# Patient Record
Sex: Female | Born: 1945 | Race: White | Hispanic: No | Smoking: Never smoker
Health system: Southern US, Community
[De-identification: ages and names within clinical notes are randomized; demographics above are authoritative.]

## PROBLEM LIST (undated history)

## (undated) DIAGNOSIS — M4802 Spinal stenosis, cervical region: Secondary | ICD-10-CM

## (undated) DIAGNOSIS — N189 Chronic kidney disease, unspecified: Secondary | ICD-10-CM

## (undated) DIAGNOSIS — E7800 Pure hypercholesterolemia, unspecified: Secondary | ICD-10-CM

## (undated) DIAGNOSIS — I10 Essential (primary) hypertension: Secondary | ICD-10-CM

## (undated) DIAGNOSIS — E119 Type 2 diabetes mellitus without complications: Secondary | ICD-10-CM

## (undated) DIAGNOSIS — M160 Bilateral primary osteoarthritis of hip: Secondary | ICD-10-CM

## (undated) DIAGNOSIS — I700 Atherosclerosis of aorta: Secondary | ICD-10-CM

## (undated) DIAGNOSIS — R42 Dizziness and giddiness: Secondary | ICD-10-CM

## (undated) DIAGNOSIS — N3281 Overactive bladder: Secondary | ICD-10-CM

## (undated) DIAGNOSIS — H9319 Tinnitus, unspecified ear: Secondary | ICD-10-CM

## (undated) DIAGNOSIS — J189 Pneumonia, unspecified organism: Secondary | ICD-10-CM

## (undated) DIAGNOSIS — G4733 Obstructive sleep apnea (adult) (pediatric): Secondary | ICD-10-CM

## (undated) DIAGNOSIS — M8580 Other specified disorders of bone density and structure, unspecified site: Secondary | ICD-10-CM

## (undated) DIAGNOSIS — K635 Polyp of colon: Secondary | ICD-10-CM

## (undated) DIAGNOSIS — H269 Unspecified cataract: Secondary | ICD-10-CM

## (undated) DIAGNOSIS — N183 Chronic kidney disease, stage 3 unspecified: Secondary | ICD-10-CM

## (undated) DIAGNOSIS — M1990 Unspecified osteoarthritis, unspecified site: Secondary | ICD-10-CM

## (undated) DIAGNOSIS — C4490 Unspecified malignant neoplasm of skin, unspecified: Secondary | ICD-10-CM

## (undated) DIAGNOSIS — C801 Malignant (primary) neoplasm, unspecified: Secondary | ICD-10-CM

## (undated) HISTORY — PX: TUBAL LIGATION: SHX77

## (undated) HISTORY — PX: OTHER SURGICAL HISTORY: SHX169

---

## 2005-02-17 ENCOUNTER — Ambulatory Visit: Payer: Self-pay | Admitting: Internal Medicine

## 2006-04-06 ENCOUNTER — Ambulatory Visit: Payer: Self-pay | Admitting: Internal Medicine

## 2007-08-04 ENCOUNTER — Ambulatory Visit: Payer: Self-pay | Admitting: Internal Medicine

## 2008-08-07 ENCOUNTER — Ambulatory Visit: Payer: Self-pay | Admitting: Unknown Physician Specialty

## 2008-08-09 ENCOUNTER — Ambulatory Visit: Payer: Self-pay | Admitting: Internal Medicine

## 2009-10-02 ENCOUNTER — Ambulatory Visit: Payer: Self-pay | Admitting: Internal Medicine

## 2010-12-02 ENCOUNTER — Ambulatory Visit: Payer: Self-pay | Admitting: Internal Medicine

## 2010-12-12 ENCOUNTER — Ambulatory Visit: Payer: Self-pay | Admitting: Internal Medicine

## 2011-03-18 ENCOUNTER — Ambulatory Visit: Payer: Self-pay | Admitting: Internal Medicine

## 2011-04-13 ENCOUNTER — Ambulatory Visit: Payer: Self-pay | Admitting: Internal Medicine

## 2011-04-20 ENCOUNTER — Ambulatory Visit: Payer: Self-pay | Admitting: Internal Medicine

## 2012-07-11 ENCOUNTER — Ambulatory Visit: Payer: Self-pay | Admitting: Internal Medicine

## 2013-08-29 ENCOUNTER — Ambulatory Visit: Payer: Self-pay | Admitting: Unknown Physician Specialty

## 2013-08-29 DIAGNOSIS — D369 Benign neoplasm, unspecified site: Secondary | ICD-10-CM

## 2013-08-29 HISTORY — DX: Benign neoplasm, unspecified site: D36.9

## 2013-08-30 LAB — PATHOLOGY REPORT

## 2013-10-13 DIAGNOSIS — E1122 Type 2 diabetes mellitus with diabetic chronic kidney disease: Secondary | ICD-10-CM | POA: Insufficient documentation

## 2013-10-13 DIAGNOSIS — E1169 Type 2 diabetes mellitus with other specified complication: Secondary | ICD-10-CM | POA: Insufficient documentation

## 2013-10-13 DIAGNOSIS — I10 Essential (primary) hypertension: Secondary | ICD-10-CM | POA: Diagnosis present

## 2013-11-05 ENCOUNTER — Ambulatory Visit: Payer: Self-pay | Admitting: Internal Medicine

## 2013-11-10 ENCOUNTER — Ambulatory Visit: Payer: Self-pay | Admitting: Internal Medicine

## 2013-11-20 ENCOUNTER — Ambulatory Visit: Payer: Self-pay | Admitting: Internal Medicine

## 2014-04-19 DIAGNOSIS — J069 Acute upper respiratory infection, unspecified: Secondary | ICD-10-CM | POA: Diagnosis not present

## 2014-04-19 DIAGNOSIS — J029 Acute pharyngitis, unspecified: Secondary | ICD-10-CM | POA: Diagnosis not present

## 2014-06-18 DIAGNOSIS — N183 Chronic kidney disease, stage 3 (moderate): Secondary | ICD-10-CM | POA: Diagnosis not present

## 2014-06-18 DIAGNOSIS — E1122 Type 2 diabetes mellitus with diabetic chronic kidney disease: Secondary | ICD-10-CM | POA: Diagnosis not present

## 2014-06-18 DIAGNOSIS — E1169 Type 2 diabetes mellitus with other specified complication: Secondary | ICD-10-CM | POA: Diagnosis not present

## 2014-06-18 DIAGNOSIS — E785 Hyperlipidemia, unspecified: Secondary | ICD-10-CM | POA: Diagnosis not present

## 2014-06-19 DIAGNOSIS — D485 Neoplasm of uncertain behavior of skin: Secondary | ICD-10-CM | POA: Diagnosis not present

## 2014-06-19 DIAGNOSIS — C44619 Basal cell carcinoma of skin of left upper limb, including shoulder: Secondary | ICD-10-CM | POA: Diagnosis not present

## 2014-06-19 DIAGNOSIS — D2262 Melanocytic nevi of left upper limb, including shoulder: Secondary | ICD-10-CM | POA: Diagnosis not present

## 2014-06-19 DIAGNOSIS — D225 Melanocytic nevi of trunk: Secondary | ICD-10-CM | POA: Diagnosis not present

## 2014-06-19 DIAGNOSIS — X32XXXA Exposure to sunlight, initial encounter: Secondary | ICD-10-CM | POA: Diagnosis not present

## 2014-06-19 DIAGNOSIS — L570 Actinic keratosis: Secondary | ICD-10-CM | POA: Diagnosis not present

## 2014-06-19 DIAGNOSIS — L400 Psoriasis vulgaris: Secondary | ICD-10-CM | POA: Diagnosis not present

## 2014-06-19 DIAGNOSIS — L821 Other seborrheic keratosis: Secondary | ICD-10-CM | POA: Diagnosis not present

## 2014-06-19 DIAGNOSIS — C44612 Basal cell carcinoma of skin of right upper limb, including shoulder: Secondary | ICD-10-CM | POA: Diagnosis not present

## 2014-06-24 DIAGNOSIS — E1142 Type 2 diabetes mellitus with diabetic polyneuropathy: Secondary | ICD-10-CM | POA: Diagnosis not present

## 2014-06-24 DIAGNOSIS — L600 Ingrowing nail: Secondary | ICD-10-CM | POA: Diagnosis not present

## 2014-06-25 DIAGNOSIS — E1169 Type 2 diabetes mellitus with other specified complication: Secondary | ICD-10-CM | POA: Diagnosis not present

## 2014-06-25 DIAGNOSIS — I10 Essential (primary) hypertension: Secondary | ICD-10-CM | POA: Diagnosis not present

## 2014-06-25 DIAGNOSIS — N182 Chronic kidney disease, stage 2 (mild): Secondary | ICD-10-CM | POA: Diagnosis not present

## 2014-06-25 DIAGNOSIS — E1122 Type 2 diabetes mellitus with diabetic chronic kidney disease: Secondary | ICD-10-CM | POA: Diagnosis not present

## 2014-08-01 DIAGNOSIS — C44612 Basal cell carcinoma of skin of right upper limb, including shoulder: Secondary | ICD-10-CM | POA: Diagnosis not present

## 2014-08-01 DIAGNOSIS — C44619 Basal cell carcinoma of skin of left upper limb, including shoulder: Secondary | ICD-10-CM | POA: Diagnosis not present

## 2014-08-07 DIAGNOSIS — I10 Essential (primary) hypertension: Secondary | ICD-10-CM | POA: Diagnosis not present

## 2014-08-07 DIAGNOSIS — H2513 Age-related nuclear cataract, bilateral: Secondary | ICD-10-CM | POA: Diagnosis not present

## 2014-08-07 DIAGNOSIS — H52223 Regular astigmatism, bilateral: Secondary | ICD-10-CM | POA: Diagnosis not present

## 2014-08-07 DIAGNOSIS — H35033 Hypertensive retinopathy, bilateral: Secondary | ICD-10-CM | POA: Diagnosis not present

## 2014-08-07 DIAGNOSIS — H5203 Hypermetropia, bilateral: Secondary | ICD-10-CM | POA: Diagnosis not present

## 2014-08-07 DIAGNOSIS — E109 Type 1 diabetes mellitus without complications: Secondary | ICD-10-CM | POA: Diagnosis not present

## 2014-08-07 DIAGNOSIS — H524 Presbyopia: Secondary | ICD-10-CM | POA: Diagnosis not present

## 2014-10-22 DIAGNOSIS — E1169 Type 2 diabetes mellitus with other specified complication: Secondary | ICD-10-CM | POA: Diagnosis not present

## 2014-10-22 DIAGNOSIS — E1122 Type 2 diabetes mellitus with diabetic chronic kidney disease: Secondary | ICD-10-CM | POA: Diagnosis not present

## 2014-10-22 DIAGNOSIS — I10 Essential (primary) hypertension: Secondary | ICD-10-CM | POA: Diagnosis not present

## 2014-10-22 DIAGNOSIS — N182 Chronic kidney disease, stage 2 (mild): Secondary | ICD-10-CM | POA: Diagnosis not present

## 2014-10-22 DIAGNOSIS — E785 Hyperlipidemia, unspecified: Secondary | ICD-10-CM | POA: Diagnosis not present

## 2014-10-29 DIAGNOSIS — Z1231 Encounter for screening mammogram for malignant neoplasm of breast: Secondary | ICD-10-CM | POA: Diagnosis not present

## 2014-10-29 DIAGNOSIS — I10 Essential (primary) hypertension: Secondary | ICD-10-CM | POA: Diagnosis not present

## 2014-10-29 DIAGNOSIS — E1169 Type 2 diabetes mellitus with other specified complication: Secondary | ICD-10-CM | POA: Diagnosis not present

## 2014-10-29 DIAGNOSIS — E1122 Type 2 diabetes mellitus with diabetic chronic kidney disease: Secondary | ICD-10-CM | POA: Diagnosis not present

## 2014-10-30 ENCOUNTER — Other Ambulatory Visit: Payer: Self-pay | Admitting: Internal Medicine

## 2014-10-30 DIAGNOSIS — Z1231 Encounter for screening mammogram for malignant neoplasm of breast: Secondary | ICD-10-CM

## 2014-11-07 DIAGNOSIS — L820 Inflamed seborrheic keratosis: Secondary | ICD-10-CM | POA: Diagnosis not present

## 2014-11-07 DIAGNOSIS — L570 Actinic keratosis: Secondary | ICD-10-CM | POA: Diagnosis not present

## 2014-11-07 DIAGNOSIS — Z85828 Personal history of other malignant neoplasm of skin: Secondary | ICD-10-CM | POA: Diagnosis not present

## 2014-11-07 DIAGNOSIS — D225 Melanocytic nevi of trunk: Secondary | ICD-10-CM | POA: Diagnosis not present

## 2014-11-07 DIAGNOSIS — X32XXXA Exposure to sunlight, initial encounter: Secondary | ICD-10-CM | POA: Diagnosis not present

## 2014-11-07 DIAGNOSIS — D2261 Melanocytic nevi of right upper limb, including shoulder: Secondary | ICD-10-CM | POA: Diagnosis not present

## 2014-11-22 ENCOUNTER — Other Ambulatory Visit: Payer: Self-pay | Admitting: Internal Medicine

## 2014-11-22 ENCOUNTER — Ambulatory Visit
Admission: RE | Admit: 2014-11-22 | Discharge: 2014-11-22 | Disposition: A | Discharge status: Home / Self Care | Payer: Commercial Managed Care - HMO | Source: Ambulatory Visit | Attending: Internal Medicine | Admitting: Internal Medicine

## 2014-11-22 DIAGNOSIS — Z1231 Encounter for screening mammogram for malignant neoplasm of breast: Secondary | ICD-10-CM | POA: Diagnosis not present

## 2015-01-17 DIAGNOSIS — Z23 Encounter for immunization: Secondary | ICD-10-CM | POA: Diagnosis not present

## 2015-02-06 DIAGNOSIS — H35033 Hypertensive retinopathy, bilateral: Secondary | ICD-10-CM | POA: Diagnosis not present

## 2015-02-06 DIAGNOSIS — H5203 Hypermetropia, bilateral: Secondary | ICD-10-CM | POA: Diagnosis not present

## 2015-02-06 DIAGNOSIS — H2513 Age-related nuclear cataract, bilateral: Secondary | ICD-10-CM | POA: Diagnosis not present

## 2015-02-06 DIAGNOSIS — I10 Essential (primary) hypertension: Secondary | ICD-10-CM | POA: Diagnosis not present

## 2015-02-06 DIAGNOSIS — E109 Type 1 diabetes mellitus without complications: Secondary | ICD-10-CM | POA: Diagnosis not present

## 2015-02-06 DIAGNOSIS — H52223 Regular astigmatism, bilateral: Secondary | ICD-10-CM | POA: Diagnosis not present

## 2015-04-24 DIAGNOSIS — E785 Hyperlipidemia, unspecified: Secondary | ICD-10-CM | POA: Diagnosis not present

## 2015-04-24 DIAGNOSIS — N182 Chronic kidney disease, stage 2 (mild): Secondary | ICD-10-CM | POA: Diagnosis not present

## 2015-04-24 DIAGNOSIS — E1169 Type 2 diabetes mellitus with other specified complication: Secondary | ICD-10-CM | POA: Diagnosis not present

## 2015-04-24 DIAGNOSIS — E1122 Type 2 diabetes mellitus with diabetic chronic kidney disease: Secondary | ICD-10-CM | POA: Diagnosis not present

## 2015-05-01 DIAGNOSIS — E1122 Type 2 diabetes mellitus with diabetic chronic kidney disease: Secondary | ICD-10-CM | POA: Diagnosis not present

## 2015-05-01 DIAGNOSIS — N182 Chronic kidney disease, stage 2 (mild): Secondary | ICD-10-CM | POA: Diagnosis not present

## 2015-05-01 DIAGNOSIS — Z794 Long term (current) use of insulin: Secondary | ICD-10-CM | POA: Diagnosis not present

## 2015-05-01 DIAGNOSIS — E785 Hyperlipidemia, unspecified: Secondary | ICD-10-CM | POA: Diagnosis not present

## 2015-05-01 DIAGNOSIS — Z0000 Encounter for general adult medical examination without abnormal findings: Secondary | ICD-10-CM | POA: Diagnosis not present

## 2015-05-01 DIAGNOSIS — I10 Essential (primary) hypertension: Secondary | ICD-10-CM | POA: Diagnosis not present

## 2015-05-01 DIAGNOSIS — E1169 Type 2 diabetes mellitus with other specified complication: Secondary | ICD-10-CM | POA: Diagnosis not present

## 2015-07-31 DIAGNOSIS — H52223 Regular astigmatism, bilateral: Secondary | ICD-10-CM | POA: Diagnosis not present

## 2015-07-31 DIAGNOSIS — E119 Type 2 diabetes mellitus without complications: Secondary | ICD-10-CM | POA: Diagnosis not present

## 2015-07-31 DIAGNOSIS — H2513 Age-related nuclear cataract, bilateral: Secondary | ICD-10-CM | POA: Diagnosis not present

## 2015-07-31 DIAGNOSIS — H35033 Hypertensive retinopathy, bilateral: Secondary | ICD-10-CM | POA: Diagnosis not present

## 2015-07-31 DIAGNOSIS — I10 Essential (primary) hypertension: Secondary | ICD-10-CM | POA: Diagnosis not present

## 2015-07-31 DIAGNOSIS — H5203 Hypermetropia, bilateral: Secondary | ICD-10-CM | POA: Diagnosis not present

## 2015-08-07 DIAGNOSIS — D2272 Melanocytic nevi of left lower limb, including hip: Secondary | ICD-10-CM | POA: Diagnosis not present

## 2015-08-07 DIAGNOSIS — Z85828 Personal history of other malignant neoplasm of skin: Secondary | ICD-10-CM | POA: Diagnosis not present

## 2015-08-07 DIAGNOSIS — D2271 Melanocytic nevi of right lower limb, including hip: Secondary | ICD-10-CM | POA: Diagnosis not present

## 2015-08-07 DIAGNOSIS — D2261 Melanocytic nevi of right upper limb, including shoulder: Secondary | ICD-10-CM | POA: Diagnosis not present

## 2015-08-07 DIAGNOSIS — D485 Neoplasm of uncertain behavior of skin: Secondary | ICD-10-CM | POA: Diagnosis not present

## 2015-08-26 DIAGNOSIS — E1169 Type 2 diabetes mellitus with other specified complication: Secondary | ICD-10-CM | POA: Diagnosis not present

## 2015-08-26 DIAGNOSIS — Z794 Long term (current) use of insulin: Secondary | ICD-10-CM | POA: Diagnosis not present

## 2015-08-26 DIAGNOSIS — E785 Hyperlipidemia, unspecified: Secondary | ICD-10-CM | POA: Diagnosis not present

## 2015-08-26 DIAGNOSIS — E1122 Type 2 diabetes mellitus with diabetic chronic kidney disease: Secondary | ICD-10-CM | POA: Diagnosis not present

## 2015-08-26 DIAGNOSIS — N182 Chronic kidney disease, stage 2 (mild): Secondary | ICD-10-CM | POA: Diagnosis not present

## 2015-09-09 DIAGNOSIS — I10 Essential (primary) hypertension: Secondary | ICD-10-CM | POA: Diagnosis not present

## 2015-09-09 DIAGNOSIS — Z794 Long term (current) use of insulin: Secondary | ICD-10-CM | POA: Diagnosis not present

## 2015-09-09 DIAGNOSIS — E785 Hyperlipidemia, unspecified: Secondary | ICD-10-CM | POA: Diagnosis not present

## 2015-09-09 DIAGNOSIS — N182 Chronic kidney disease, stage 2 (mild): Secondary | ICD-10-CM | POA: Diagnosis not present

## 2015-09-09 DIAGNOSIS — E1169 Type 2 diabetes mellitus with other specified complication: Secondary | ICD-10-CM | POA: Diagnosis not present

## 2015-09-09 DIAGNOSIS — E1122 Type 2 diabetes mellitus with diabetic chronic kidney disease: Secondary | ICD-10-CM | POA: Diagnosis not present

## 2015-10-01 DIAGNOSIS — C44612 Basal cell carcinoma of skin of right upper limb, including shoulder: Secondary | ICD-10-CM | POA: Diagnosis not present

## 2015-11-12 DIAGNOSIS — L720 Epidermal cyst: Secondary | ICD-10-CM | POA: Diagnosis not present

## 2015-11-26 ENCOUNTER — Other Ambulatory Visit: Payer: Self-pay | Admitting: Internal Medicine

## 2015-11-26 DIAGNOSIS — Z1231 Encounter for screening mammogram for malignant neoplasm of breast: Secondary | ICD-10-CM

## 2015-12-17 ENCOUNTER — Ambulatory Visit: Payer: Commercial Managed Care - HMO

## 2015-12-22 ENCOUNTER — Other Ambulatory Visit: Payer: Self-pay | Admitting: Internal Medicine

## 2015-12-22 ENCOUNTER — Ambulatory Visit
Admission: RE | Admit: 2015-12-22 | Discharge: 2015-12-22 | Disposition: A | Discharge status: Home / Self Care | Payer: Commercial Managed Care - HMO | Source: Ambulatory Visit | Attending: Internal Medicine | Admitting: Internal Medicine

## 2015-12-22 DIAGNOSIS — Z1231 Encounter for screening mammogram for malignant neoplasm of breast: Secondary | ICD-10-CM | POA: Diagnosis not present

## 2016-01-02 DIAGNOSIS — Z23 Encounter for immunization: Secondary | ICD-10-CM | POA: Diagnosis not present

## 2016-01-20 DIAGNOSIS — E1122 Type 2 diabetes mellitus with diabetic chronic kidney disease: Secondary | ICD-10-CM | POA: Diagnosis not present

## 2016-01-20 DIAGNOSIS — E1169 Type 2 diabetes mellitus with other specified complication: Secondary | ICD-10-CM | POA: Diagnosis not present

## 2016-01-20 DIAGNOSIS — Z794 Long term (current) use of insulin: Secondary | ICD-10-CM | POA: Diagnosis not present

## 2016-01-20 DIAGNOSIS — N182 Chronic kidney disease, stage 2 (mild): Secondary | ICD-10-CM | POA: Diagnosis not present

## 2016-01-20 DIAGNOSIS — E785 Hyperlipidemia, unspecified: Secondary | ICD-10-CM | POA: Diagnosis not present

## 2016-01-21 DIAGNOSIS — D225 Melanocytic nevi of trunk: Secondary | ICD-10-CM | POA: Diagnosis not present

## 2016-01-21 DIAGNOSIS — D2239 Melanocytic nevi of other parts of face: Secondary | ICD-10-CM | POA: Diagnosis not present

## 2016-01-21 DIAGNOSIS — D2261 Melanocytic nevi of right upper limb, including shoulder: Secondary | ICD-10-CM | POA: Diagnosis not present

## 2016-01-21 DIAGNOSIS — Z85828 Personal history of other malignant neoplasm of skin: Secondary | ICD-10-CM | POA: Diagnosis not present

## 2016-01-21 DIAGNOSIS — D485 Neoplasm of uncertain behavior of skin: Secondary | ICD-10-CM | POA: Diagnosis not present

## 2016-01-21 DIAGNOSIS — L400 Psoriasis vulgaris: Secondary | ICD-10-CM | POA: Diagnosis not present

## 2016-01-27 DIAGNOSIS — E1169 Type 2 diabetes mellitus with other specified complication: Secondary | ICD-10-CM | POA: Diagnosis not present

## 2016-01-27 DIAGNOSIS — I10 Essential (primary) hypertension: Secondary | ICD-10-CM | POA: Diagnosis not present

## 2016-01-27 DIAGNOSIS — N182 Chronic kidney disease, stage 2 (mild): Secondary | ICD-10-CM | POA: Diagnosis not present

## 2016-01-27 DIAGNOSIS — E785 Hyperlipidemia, unspecified: Secondary | ICD-10-CM | POA: Diagnosis not present

## 2016-01-27 DIAGNOSIS — Z794 Long term (current) use of insulin: Secondary | ICD-10-CM | POA: Diagnosis not present

## 2016-01-27 DIAGNOSIS — E1122 Type 2 diabetes mellitus with diabetic chronic kidney disease: Secondary | ICD-10-CM | POA: Diagnosis not present

## 2016-05-26 DIAGNOSIS — E1169 Type 2 diabetes mellitus with other specified complication: Secondary | ICD-10-CM | POA: Diagnosis not present

## 2016-05-26 DIAGNOSIS — Z794 Long term (current) use of insulin: Secondary | ICD-10-CM | POA: Diagnosis not present

## 2016-05-26 DIAGNOSIS — E1122 Type 2 diabetes mellitus with diabetic chronic kidney disease: Secondary | ICD-10-CM | POA: Diagnosis not present

## 2016-05-26 DIAGNOSIS — E785 Hyperlipidemia, unspecified: Secondary | ICD-10-CM | POA: Diagnosis not present

## 2016-05-26 DIAGNOSIS — N182 Chronic kidney disease, stage 2 (mild): Secondary | ICD-10-CM | POA: Diagnosis not present

## 2016-06-02 DIAGNOSIS — N182 Chronic kidney disease, stage 2 (mild): Secondary | ICD-10-CM | POA: Diagnosis not present

## 2016-06-02 DIAGNOSIS — I10 Essential (primary) hypertension: Secondary | ICD-10-CM | POA: Diagnosis not present

## 2016-06-02 DIAGNOSIS — E1122 Type 2 diabetes mellitus with diabetic chronic kidney disease: Secondary | ICD-10-CM | POA: Diagnosis not present

## 2016-06-02 DIAGNOSIS — E785 Hyperlipidemia, unspecified: Secondary | ICD-10-CM | POA: Diagnosis not present

## 2016-06-02 DIAGNOSIS — E1169 Type 2 diabetes mellitus with other specified complication: Secondary | ICD-10-CM | POA: Diagnosis not present

## 2016-06-02 DIAGNOSIS — Z794 Long term (current) use of insulin: Secondary | ICD-10-CM | POA: Diagnosis not present

## 2016-07-29 DIAGNOSIS — H5203 Hypermetropia, bilateral: Secondary | ICD-10-CM | POA: Diagnosis not present

## 2016-07-29 DIAGNOSIS — H524 Presbyopia: Secondary | ICD-10-CM | POA: Diagnosis not present

## 2016-07-29 DIAGNOSIS — E119 Type 2 diabetes mellitus without complications: Secondary | ICD-10-CM | POA: Diagnosis not present

## 2016-07-29 DIAGNOSIS — H35033 Hypertensive retinopathy, bilateral: Secondary | ICD-10-CM | POA: Diagnosis not present

## 2016-07-29 DIAGNOSIS — H52223 Regular astigmatism, bilateral: Secondary | ICD-10-CM | POA: Diagnosis not present

## 2016-07-29 DIAGNOSIS — I10 Essential (primary) hypertension: Secondary | ICD-10-CM | POA: Diagnosis not present

## 2016-07-29 DIAGNOSIS — H2513 Age-related nuclear cataract, bilateral: Secondary | ICD-10-CM | POA: Diagnosis not present

## 2016-07-30 DIAGNOSIS — L02211 Cutaneous abscess of abdominal wall: Secondary | ICD-10-CM | POA: Diagnosis not present

## 2016-08-02 DIAGNOSIS — L02211 Cutaneous abscess of abdominal wall: Secondary | ICD-10-CM | POA: Diagnosis not present

## 2016-08-18 DIAGNOSIS — L02211 Cutaneous abscess of abdominal wall: Secondary | ICD-10-CM | POA: Diagnosis not present

## 2016-09-01 DIAGNOSIS — L02211 Cutaneous abscess of abdominal wall: Secondary | ICD-10-CM | POA: Diagnosis not present

## 2016-09-28 DIAGNOSIS — Z794 Long term (current) use of insulin: Secondary | ICD-10-CM | POA: Diagnosis not present

## 2016-09-28 DIAGNOSIS — E1122 Type 2 diabetes mellitus with diabetic chronic kidney disease: Secondary | ICD-10-CM | POA: Diagnosis not present

## 2016-09-28 DIAGNOSIS — E785 Hyperlipidemia, unspecified: Secondary | ICD-10-CM | POA: Diagnosis not present

## 2016-09-28 DIAGNOSIS — E1169 Type 2 diabetes mellitus with other specified complication: Secondary | ICD-10-CM | POA: Diagnosis not present

## 2016-09-28 DIAGNOSIS — N182 Chronic kidney disease, stage 2 (mild): Secondary | ICD-10-CM | POA: Diagnosis not present

## 2016-09-28 DIAGNOSIS — I10 Essential (primary) hypertension: Secondary | ICD-10-CM | POA: Diagnosis not present

## 2016-10-05 DIAGNOSIS — I10 Essential (primary) hypertension: Secondary | ICD-10-CM | POA: Diagnosis not present

## 2016-10-05 DIAGNOSIS — E1122 Type 2 diabetes mellitus with diabetic chronic kidney disease: Secondary | ICD-10-CM | POA: Diagnosis not present

## 2016-10-05 DIAGNOSIS — E785 Hyperlipidemia, unspecified: Secondary | ICD-10-CM | POA: Diagnosis not present

## 2016-10-05 DIAGNOSIS — N182 Chronic kidney disease, stage 2 (mild): Secondary | ICD-10-CM | POA: Diagnosis not present

## 2016-10-05 DIAGNOSIS — E1169 Type 2 diabetes mellitus with other specified complication: Secondary | ICD-10-CM | POA: Diagnosis not present

## 2016-10-05 DIAGNOSIS — Z794 Long term (current) use of insulin: Secondary | ICD-10-CM | POA: Diagnosis not present

## 2016-10-06 DIAGNOSIS — L02211 Cutaneous abscess of abdominal wall: Secondary | ICD-10-CM | POA: Diagnosis not present

## 2016-10-11 ENCOUNTER — Other Ambulatory Visit: Payer: Self-pay | Admitting: Internal Medicine

## 2016-10-11 DIAGNOSIS — Z1231 Encounter for screening mammogram for malignant neoplasm of breast: Secondary | ICD-10-CM

## 2016-12-08 DIAGNOSIS — Z8371 Family history of colonic polyps: Secondary | ICD-10-CM | POA: Diagnosis not present

## 2016-12-08 DIAGNOSIS — Z86010 Personal history of colonic polyps: Secondary | ICD-10-CM | POA: Diagnosis not present

## 2016-12-23 DIAGNOSIS — L814 Other melanin hyperpigmentation: Secondary | ICD-10-CM | POA: Diagnosis not present

## 2016-12-23 DIAGNOSIS — L570 Actinic keratosis: Secondary | ICD-10-CM | POA: Diagnosis not present

## 2016-12-23 DIAGNOSIS — D235 Other benign neoplasm of skin of trunk: Secondary | ICD-10-CM | POA: Diagnosis not present

## 2016-12-23 DIAGNOSIS — X32XXXA Exposure to sunlight, initial encounter: Secondary | ICD-10-CM | POA: Diagnosis not present

## 2016-12-23 DIAGNOSIS — Z85828 Personal history of other malignant neoplasm of skin: Secondary | ICD-10-CM | POA: Diagnosis not present

## 2016-12-23 DIAGNOSIS — B351 Tinea unguium: Secondary | ICD-10-CM | POA: Diagnosis not present

## 2016-12-31 DIAGNOSIS — Z23 Encounter for immunization: Secondary | ICD-10-CM | POA: Diagnosis not present

## 2017-01-05 ENCOUNTER — Ambulatory Visit
Admission: RE | Admit: 2017-01-05 | Discharge: 2017-01-05 | Disposition: A | Discharge status: Home / Self Care | Payer: Commercial Managed Care - HMO | Source: Ambulatory Visit | Attending: Internal Medicine | Admitting: Internal Medicine

## 2017-01-05 DIAGNOSIS — Z1231 Encounter for screening mammogram for malignant neoplasm of breast: Secondary | ICD-10-CM | POA: Insufficient documentation

## 2017-02-02 DIAGNOSIS — Z794 Long term (current) use of insulin: Secondary | ICD-10-CM | POA: Diagnosis not present

## 2017-02-02 DIAGNOSIS — E785 Hyperlipidemia, unspecified: Secondary | ICD-10-CM | POA: Diagnosis not present

## 2017-02-02 DIAGNOSIS — E1169 Type 2 diabetes mellitus with other specified complication: Secondary | ICD-10-CM | POA: Diagnosis not present

## 2017-02-02 DIAGNOSIS — I10 Essential (primary) hypertension: Secondary | ICD-10-CM | POA: Diagnosis not present

## 2017-02-02 DIAGNOSIS — E1122 Type 2 diabetes mellitus with diabetic chronic kidney disease: Secondary | ICD-10-CM | POA: Diagnosis not present

## 2017-02-02 DIAGNOSIS — N182 Chronic kidney disease, stage 2 (mild): Secondary | ICD-10-CM | POA: Diagnosis not present

## 2017-02-09 DIAGNOSIS — Z0000 Encounter for general adult medical examination without abnormal findings: Secondary | ICD-10-CM | POA: Diagnosis not present

## 2017-02-09 DIAGNOSIS — I10 Essential (primary) hypertension: Secondary | ICD-10-CM | POA: Diagnosis not present

## 2017-02-09 DIAGNOSIS — E1169 Type 2 diabetes mellitus with other specified complication: Secondary | ICD-10-CM | POA: Diagnosis not present

## 2017-02-09 DIAGNOSIS — E785 Hyperlipidemia, unspecified: Secondary | ICD-10-CM | POA: Diagnosis not present

## 2017-02-09 DIAGNOSIS — Z794 Long term (current) use of insulin: Secondary | ICD-10-CM | POA: Diagnosis not present

## 2017-02-09 DIAGNOSIS — N182 Chronic kidney disease, stage 2 (mild): Secondary | ICD-10-CM | POA: Diagnosis not present

## 2017-02-09 DIAGNOSIS — E1122 Type 2 diabetes mellitus with diabetic chronic kidney disease: Secondary | ICD-10-CM | POA: Diagnosis not present

## 2017-02-22 ENCOUNTER — Encounter: Payer: Self-pay | Admitting: *Deleted

## 2017-02-23 ENCOUNTER — Encounter: Payer: Self-pay | Admitting: Anesthesiology

## 2017-02-23 ENCOUNTER — Ambulatory Visit
Admission: RE | Admit: 2017-02-23 | Discharge: 2017-02-23 | Disposition: A | Discharge status: Home / Self Care | Payer: Commercial Managed Care - HMO | Source: Ambulatory Visit | Attending: Unknown Physician Specialty | Admitting: Unknown Physician Specialty

## 2017-02-23 ENCOUNTER — Ambulatory Visit: Payer: Commercial Managed Care - HMO | Admitting: Anesthesiology

## 2017-02-23 ENCOUNTER — Encounter
Admission: RE | Disposition: A | Discharge status: Home / Self Care | Payer: Self-pay | Source: Ambulatory Visit | Attending: Unknown Physician Specialty

## 2017-02-23 DIAGNOSIS — I129 Hypertensive chronic kidney disease with stage 1 through stage 4 chronic kidney disease, or unspecified chronic kidney disease: Secondary | ICD-10-CM | POA: Diagnosis not present

## 2017-02-23 DIAGNOSIS — Q438 Other specified congenital malformations of intestine: Secondary | ICD-10-CM | POA: Insufficient documentation

## 2017-02-23 DIAGNOSIS — Z7982 Long term (current) use of aspirin: Secondary | ICD-10-CM | POA: Insufficient documentation

## 2017-02-23 DIAGNOSIS — E1122 Type 2 diabetes mellitus with diabetic chronic kidney disease: Secondary | ICD-10-CM | POA: Diagnosis not present

## 2017-02-23 DIAGNOSIS — Z86010 Personal history of colonic polyps: Secondary | ICD-10-CM | POA: Diagnosis not present

## 2017-02-23 DIAGNOSIS — D120 Benign neoplasm of cecum: Secondary | ICD-10-CM | POA: Insufficient documentation

## 2017-02-23 DIAGNOSIS — K635 Polyp of colon: Secondary | ICD-10-CM | POA: Insufficient documentation

## 2017-02-23 DIAGNOSIS — M8580 Other specified disorders of bone density and structure, unspecified site: Secondary | ICD-10-CM | POA: Insufficient documentation

## 2017-02-23 DIAGNOSIS — E7800 Pure hypercholesterolemia, unspecified: Secondary | ICD-10-CM | POA: Insufficient documentation

## 2017-02-23 DIAGNOSIS — Z79899 Other long term (current) drug therapy: Secondary | ICD-10-CM | POA: Insufficient documentation

## 2017-02-23 DIAGNOSIS — R229 Localized swelling, mass and lump, unspecified: Secondary | ICD-10-CM | POA: Diagnosis not present

## 2017-02-23 DIAGNOSIS — K640 First degree hemorrhoids: Secondary | ICD-10-CM | POA: Insufficient documentation

## 2017-02-23 DIAGNOSIS — Z794 Long term (current) use of insulin: Secondary | ICD-10-CM | POA: Insufficient documentation

## 2017-02-23 DIAGNOSIS — Z1211 Encounter for screening for malignant neoplasm of colon: Secondary | ICD-10-CM | POA: Insufficient documentation

## 2017-02-23 DIAGNOSIS — K6389 Other specified diseases of intestine: Secondary | ICD-10-CM | POA: Diagnosis not present

## 2017-02-23 DIAGNOSIS — R69 Illness, unspecified: Secondary | ICD-10-CM | POA: Diagnosis not present

## 2017-02-23 DIAGNOSIS — E119 Type 2 diabetes mellitus without complications: Secondary | ICD-10-CM | POA: Diagnosis not present

## 2017-02-23 DIAGNOSIS — N182 Chronic kidney disease, stage 2 (mild): Secondary | ICD-10-CM | POA: Diagnosis not present

## 2017-02-23 DIAGNOSIS — K648 Other hemorrhoids: Secondary | ICD-10-CM | POA: Diagnosis not present

## 2017-02-23 DIAGNOSIS — I10 Essential (primary) hypertension: Secondary | ICD-10-CM | POA: Diagnosis not present

## 2017-02-23 HISTORY — DX: Type 2 diabetes mellitus without complications: E11.9

## 2017-02-23 HISTORY — DX: Essential (primary) hypertension: I10

## 2017-02-23 HISTORY — PX: COLONOSCOPY WITH PROPOFOL: SHX5780

## 2017-02-23 HISTORY — DX: Pure hypercholesterolemia, unspecified: E78.00

## 2017-02-23 HISTORY — DX: Chronic kidney disease, unspecified: N18.9

## 2017-02-23 HISTORY — DX: Other specified disorders of bone density and structure, unspecified site: M85.80

## 2017-02-23 LAB — GLUCOSE, CAPILLARY: GLUCOSE-CAPILLARY: 192 mg/dL — AB (ref 65–99)

## 2017-02-23 SURGERY — COLONOSCOPY WITH PROPOFOL
Anesthesia: General

## 2017-02-23 MED ORDER — EPHEDRINE SULFATE 50 MG/ML IJ SOLN
INTRAMUSCULAR | Status: DC | PRN
  Administered 2017-02-23: 10 mg via INTRAVENOUS

## 2017-02-23 MED ORDER — SODIUM CHLORIDE 0.9 % IV SOLN
INTRAVENOUS | Status: DC
  Administered 2017-02-23 (×2): via INTRAVENOUS

## 2017-02-23 MED ORDER — LIDOCAINE 2% (20 MG/ML) 5 ML SYRINGE
INTRAMUSCULAR | Status: DC | PRN
  Administered 2017-02-23: 40 mg via INTRAVENOUS

## 2017-02-23 MED ORDER — FENTANYL CITRATE (PF) 100 MCG/2ML IJ SOLN
INTRAMUSCULAR | Status: DC | PRN
  Administered 2017-02-23 (×2): 50 ug via INTRAVENOUS

## 2017-02-23 MED ORDER — PHENYLEPHRINE HCL 10 MG/ML IJ SOLN
INTRAMUSCULAR | Status: DC | PRN
  Administered 2017-02-23: 100 ug via INTRAVENOUS

## 2017-02-23 MED ORDER — PROPOFOL 10 MG/ML IV BOLUS
INTRAVENOUS | Status: DC | PRN
  Administered 2017-02-23: 100 mg via INTRAVENOUS

## 2017-02-23 MED ORDER — PROPOFOL 500 MG/50ML IV EMUL
INTRAVENOUS | Status: AC
  Filled 2017-02-23: qty 50

## 2017-02-23 MED ORDER — PROPOFOL 10 MG/ML IV BOLUS
INTRAVENOUS | Status: AC
  Filled 2017-02-23: qty 20

## 2017-02-23 MED ORDER — FENTANYL CITRATE (PF) 100 MCG/2ML IJ SOLN
INTRAMUSCULAR | Status: AC
  Filled 2017-02-23: qty 2

## 2017-02-23 MED ORDER — PROPOFOL 500 MG/50ML IV EMUL
INTRAVENOUS | Status: DC | PRN
  Administered 2017-02-23: 160 ug/kg/min via INTRAVENOUS

## 2017-02-23 MED ORDER — SODIUM CHLORIDE 0.9 % IV SOLN: INTRAVENOUS | Status: DC

## 2017-02-23 NOTE — Anesthesia Preprocedure Evaluation (Addendum)
Anesthesia Evaluation  Patient identified by MRN, date of birth, ID band Patient awake    Reviewed: Allergy & Precautions, NPO status , Patient's Chart, lab work & pertinent test results, reviewed documented beta blocker date and time   Airway Mallampati: III  TM Distance: >3 FB     Dental  (+) Chipped, Upper Dentures   Pulmonary           Cardiovascular hypertension, Pt. on medications      Neuro/Psych    GI/Hepatic   Endo/Other  diabetes, Type 2  Renal/GU Renal disease     Musculoskeletal   Abdominal   Peds  Hematology   Anesthesia Other Findings   Reproductive/Obstetrics                            Anesthesia Physical Anesthesia Plan  ASA: III  Anesthesia Plan: General   Post-op Pain Management:    Induction: Intravenous  PONV Risk Score and Plan:   Airway Management Planned:   Additional Equipment:   Intra-op Plan:   Post-operative Plan:   Informed Consent: I have reviewed the patients History and Physical, chart, labs and discussed the procedure including the risks, benefits and alternatives for the proposed anesthesia with the patient or authorized representative who has indicated his/her understanding and acceptance.     Plan Discussed with: CRNA  Anesthesia Plan Comments:         Anesthesia Quick Evaluation

## 2017-02-23 NOTE — Anesthesia Postprocedure Evaluation (Signed)
Anesthesia Post Note  Patient: Morgan Nicholson  Procedure(s) Performed: COLONOSCOPY WITH PROPOFOL (N/A )  Patient location during evaluation: Endoscopy Anesthesia Type: General Level of consciousness: awake and alert Pain management: pain level controlled Vital Signs Assessment: post-procedure vital signs reviewed and stable Respiratory status: spontaneous breathing, nonlabored ventilation, respiratory function stable and patient connected to nasal cannula oxygen Cardiovascular status: blood pressure returned to baseline and stable Postop Assessment: no apparent nausea or vomiting Anesthetic complications: no     Last Vitals:  Vitals:   02/23/17 0945 02/23/17 0955  BP: (!) 95/57 118/70  Pulse: (!) 59 66  Resp: 13 13  Temp: (!) 36.1 C   SpO2: 93% 95%    Last Pain:  Vitals:   02/23/17 0945  TempSrc: Tympanic  PainSc: [name]

## 2017-02-23 NOTE — Op Note (Signed)
[facility] Gastroenterology Patient Name: Nataley Bahri Procedure Date: 02/23/2017 8:59 AM MRN: [id number] Account #: [id number] Date of Birth: [birth year] Admit Type: Outpatient Age: 71 Room: [facility] ENDO ROOM 3 Gender: Female Note Status: Finalized Procedure:            Colonoscopy Indications:          High risk colon cancer surveillance: Personal history                        of colonic polyps Providers:            Manya Silvas, MD Referring MD:         Ocie Cornfield. Ouida Sills MD, MD (Referring MD) Medicines:            Propofol per Anesthesia Complications:        No immediate complications. Procedure:            Pre-Anesthesia Assessment:                       - After reviewing the risks and benefits, the patient                        was deemed in satisfactory condition to undergo the                        procedure.                       After obtaining informed consent, the colonoscope was                        passed under direct vision. Throughout the procedure,                        the patient's blood pressure, pulse, and oxygen                        saturations were monitored continuously. The                        Colonoscope was introduced through the anus and                        advanced to the the cecum, identified by appendiceal                        orifice and ileocecal valve. The colonoscopy was                        somewhat difficult due to significant looping. The                        patient tolerated the procedure well. The quality of                        the bowel preparation was excellent. Findings:      A diminutive polyp was found in the cecum. The polyp was sessile. The       polyp was removed with a jumbo cold forceps. Resection and retrieval       were complete.      A  submucosal non-obstructing medium-sized mass was found in the mid       ascending colon. It was soft. The mass was non-circumferential. No   bleeding was present. Biopsies were taken with a cold forceps for       histology. This was not reported on previous procedures so it was likely       to evolve since then.      A diminutive polyp was found in the transverse colon. The polyp was       sessile. The polyp was removed with a hot snare. Resection was complete,       but the polyp tissue was not retrieved.      Internal hemorrhoids were found during endoscopy. The hemorrhoids were       small and Grade I (internal hemorrhoids that do not prolapse). Impression:           - One diminutive polyp in the cecum, removed with a                        jumbo cold forceps. Resected and retrieved.                       - Likely benign tumor in the mid ascending colon.                        Biopsied.                       - One diminutive polyp in the transverse colon, removed                        with a hot snare. Complete resection. Polyp tissue not                        retrieved.                       - Internal hemorrhoids. Recommendation:       - Await pathology results. Manya Silvas, MD 02/23/2017 9:44:09 AM This report has been signed electronically. Number of Addenda: 0 Note Initiated On: 02/23/2017 8:59 AM Scope Withdrawal Time: 0 hours 22 minutes 9 seconds  Total Procedure Duration: 0 hours 32 minutes 21 seconds       [facility] ([facility])

## 2017-02-23 NOTE — Transfer of Care (Signed)
Immediate Anesthesia Transfer of Care Note  Patient: Morgan Nicholson  Procedure(s) Performed: COLONOSCOPY WITH PROPOFOL (N/A )  Patient Location: PACU and Endoscopy Unit  Anesthesia Type:General  Level of Consciousness: drowsy  Airway & Oxygen Therapy: Patient Spontanous Breathing and Patient connected to nasal cannula oxygen  Post-op Assessment: Report given to RN and Post -op Vital signs reviewed and stable  Post vital signs: Reviewed and stable  Last Vitals:  Vitals:   02/23/17 0754  BP: 134/72  Pulse: 64  Resp: 14  Temp: (!) 36.2 C    Last Pain:  Vitals:   02/23/17 0754  TempSrc: Tympanic         Complications: No apparent anesthesia complications

## 2017-02-23 NOTE — Anesthesia Post-op Follow-up Note (Signed)
Anesthesia QCDR form completed.        

## 2017-02-23 NOTE — H&P (Signed)
Primary Care Physician:  Kirk Ruths, MD Primary Gastroenterologist:  Dr. Vira Agar  Pre-Procedure History & Physical: HPI:  Morgan Nicholson is a 71 y.o. female is here for an colonoscopy.   Past Medical History:  Diagnosis Date  . Chronic kidney disease    stage 2  . Diabetes mellitus without complication ([place])   . High cholesterol   . Hypertension   . Osteopenia     Past Surgical History:  Procedure Laterality Date  . colonic polyps    . TUBAL LIGATION      Prior to Admission medications   Medication Sig Start Date End Date Taking? Authorizing Provider  acyclovir (ZOVIRAX) 800 MG tablet Take 800 mg 3 (three) times daily by mouth.   Yes [provider]  amLODipine (NORVASC) 5 MG tablet Take 5 mg daily by mouth.   Yes [provider]  aspirin EC 81 MG tablet Take 81 mg daily by mouth.   Yes [provider]  atorvastatin (LIPITOR) 80 MG tablet Take 80 mg daily by mouth.   Yes [provider]  calcium-vitamin D (OSCAL WITH D) 500-200 MG-UNIT tablet Take 1 tablet 2 (two) times daily by mouth.   Yes [provider]  carvedilol (COREG) 25 MG tablet Take 25 mg daily by mouth.   Yes [provider]  glimepiride (AMARYL) 2 MG tablet Take 2 mg daily with breakfast by mouth.   Yes [provider]  insulin NPH-regular Human (NOVOLIN 70/30) (70-30) 100 UNIT/ML injection Inject 20 Units 2 (two) times daily with a meal into the skin.   Yes [provider]  lisinopril (PRINIVIL,ZESTRIL) 40 MG tablet Take 40 mg daily by mouth.   Yes [provider]  metFORMIN (GLUCOPHAGE) 1000 MG tablet Take 1,000 mg daily by mouth.   Yes [provider]    Allergies as of 12/09/2016  . (No Known Allergies)    Family History  Problem Relation Age of Onset  . Breast cancer Neg Hx     Social History   Socioeconomic History  . Marital status: Divorced    Spouse name: Not on file  . Number of children: Not  on file  . Years of education: Not on file  . Highest education level: Not on file  Social Needs  . Financial resource strain: Not on file  . Food insecurity - worry: Not on file  . Food insecurity - inability: Not on file  . Transportation needs - medical: Not on file  . Transportation needs - non-medical: Not on file  Occupational History  . Not on file  Tobacco Use  . Smoking status: Never Smoker  . Smokeless tobacco: Never Used  Substance and Sexual Activity  . Alcohol use: No    Frequency: Never  . Drug use: No  . Sexual activity: Not on file  Other Topics Concern  . Not on file  Social History Narrative  . Not on file    Review of Systems: See HPI, otherwise negative ROS  Physical Exam: BP 134/72   Pulse 64   Temp (!) 97.1 F (36.2 C) (Tympanic)   Resp 14   Ht 5\' 5"  (1.651 m)   Wt 78.9 kg (174 lb)   BMI 28.96 kg/m  General:   Alert,  pleasant and cooperative in NAD Head:  Normocephalic and atraumatic. Neck:  Supple; no masses or thyromegaly. Lungs:  Clear throughout to auscultation.    Heart:  Regular rate and rhythm. Abdomen:  Soft,  nontender and nondistended. Normal bowel sounds, without guarding, and without rebound.   Neurologic:  Alert and  oriented x4;  grossly normal neurologically.  Impression/Plan: Morgan Nicholson is here for an colonoscopy to be performed for [facility] colon polyps removed 3 years ago.  Risks, benefits, limitations, and alternatives regarding  colonoscopy have been reviewed with the patient.  Questions have been answered.  All parties agreeable.   Gaylyn Cheers, MD  02/23/2017, 8:58 AM

## 2017-02-24 ENCOUNTER — Encounter: Payer: Self-pay | Admitting: Unknown Physician Specialty

## 2017-02-24 LAB — SURGICAL PATHOLOGY

## 2017-03-10 ENCOUNTER — Other Ambulatory Visit: Payer: Self-pay | Admitting: Unknown Physician Specialty

## 2017-03-10 DIAGNOSIS — K6389 Other specified diseases of intestine: Secondary | ICD-10-CM

## 2017-03-10 DIAGNOSIS — R109 Unspecified abdominal pain: Secondary | ICD-10-CM

## 2017-03-17 ENCOUNTER — Ambulatory Visit
Admission: RE | Admit: 2017-03-17 | Discharge: 2017-03-17 | Disposition: A | Discharge status: Home / Self Care | Payer: Medicare HMO | Source: Ambulatory Visit | Attending: Unknown Physician Specialty | Admitting: Unknown Physician Specialty

## 2017-03-17 DIAGNOSIS — R109 Unspecified abdominal pain: Secondary | ICD-10-CM | POA: Diagnosis not present

## 2017-03-17 DIAGNOSIS — R1909 Other intra-abdominal and pelvic swelling, mass and lump: Secondary | ICD-10-CM | POA: Diagnosis not present

## 2017-03-17 DIAGNOSIS — K6389 Other specified diseases of intestine: Secondary | ICD-10-CM

## 2017-03-17 DIAGNOSIS — I700 Atherosclerosis of aorta: Secondary | ICD-10-CM | POA: Diagnosis not present

## 2017-03-17 LAB — POCT I-STAT CREATININE: Creatinine, Ser: 0.9 mg/dL (ref 0.44–1.00)

## 2017-03-17 MED ORDER — IOPAMIDOL (ISOVUE-300) INJECTION 61%
100.0000 mL | Freq: Once | INTRAVENOUS | Status: AC | PRN
  Administered 2017-03-17: 100 mL via INTRAVENOUS

## 2017-05-02 DIAGNOSIS — R51 Headache: Secondary | ICD-10-CM | POA: Diagnosis not present

## 2017-05-02 DIAGNOSIS — J019 Acute sinusitis, unspecified: Secondary | ICD-10-CM | POA: Diagnosis not present

## 2017-06-07 DIAGNOSIS — N182 Chronic kidney disease, stage 2 (mild): Secondary | ICD-10-CM | POA: Diagnosis not present

## 2017-06-07 DIAGNOSIS — I10 Essential (primary) hypertension: Secondary | ICD-10-CM | POA: Diagnosis not present

## 2017-06-07 DIAGNOSIS — E1169 Type 2 diabetes mellitus with other specified complication: Secondary | ICD-10-CM | POA: Diagnosis not present

## 2017-06-07 DIAGNOSIS — Z794 Long term (current) use of insulin: Secondary | ICD-10-CM | POA: Diagnosis not present

## 2017-06-07 DIAGNOSIS — E785 Hyperlipidemia, unspecified: Secondary | ICD-10-CM | POA: Diagnosis not present

## 2017-06-07 DIAGNOSIS — E1122 Type 2 diabetes mellitus with diabetic chronic kidney disease: Secondary | ICD-10-CM | POA: Diagnosis not present

## 2017-06-14 DIAGNOSIS — I10 Essential (primary) hypertension: Secondary | ICD-10-CM | POA: Diagnosis not present

## 2017-06-14 DIAGNOSIS — E1169 Type 2 diabetes mellitus with other specified complication: Secondary | ICD-10-CM | POA: Diagnosis not present

## 2017-06-14 DIAGNOSIS — E785 Hyperlipidemia, unspecified: Secondary | ICD-10-CM | POA: Diagnosis not present

## 2017-06-14 DIAGNOSIS — Z794 Long term (current) use of insulin: Secondary | ICD-10-CM | POA: Diagnosis not present

## 2017-06-14 DIAGNOSIS — N182 Chronic kidney disease, stage 2 (mild): Secondary | ICD-10-CM | POA: Diagnosis not present

## 2017-06-14 DIAGNOSIS — E1122 Type 2 diabetes mellitus with diabetic chronic kidney disease: Secondary | ICD-10-CM | POA: Diagnosis not present

## 2017-08-15 ENCOUNTER — Other Ambulatory Visit: Payer: Self-pay | Admitting: Physician Assistant

## 2017-08-15 ENCOUNTER — Ambulatory Visit
Admission: RE | Admit: 2017-08-15 | Discharge: 2017-08-15 | Disposition: A | Discharge status: Home / Self Care | Payer: Medicare HMO | Source: Ambulatory Visit | Attending: Physician Assistant | Admitting: Physician Assistant

## 2017-08-15 DIAGNOSIS — M79605 Pain in left leg: Secondary | ICD-10-CM

## 2017-08-15 DIAGNOSIS — M7712 Lateral epicondylitis, left elbow: Secondary | ICD-10-CM | POA: Diagnosis not present

## 2017-08-15 DIAGNOSIS — M79662 Pain in left lower leg: Secondary | ICD-10-CM | POA: Diagnosis not present

## 2017-10-05 DIAGNOSIS — E1169 Type 2 diabetes mellitus with other specified complication: Secondary | ICD-10-CM | POA: Diagnosis not present

## 2017-10-05 DIAGNOSIS — E1122 Type 2 diabetes mellitus with diabetic chronic kidney disease: Secondary | ICD-10-CM | POA: Diagnosis not present

## 2017-10-05 DIAGNOSIS — Z794 Long term (current) use of insulin: Secondary | ICD-10-CM | POA: Diagnosis not present

## 2017-10-05 DIAGNOSIS — I10 Essential (primary) hypertension: Secondary | ICD-10-CM | POA: Diagnosis not present

## 2017-10-05 DIAGNOSIS — E785 Hyperlipidemia, unspecified: Secondary | ICD-10-CM | POA: Diagnosis not present

## 2017-10-05 DIAGNOSIS — N182 Chronic kidney disease, stage 2 (mild): Secondary | ICD-10-CM | POA: Diagnosis not present

## 2017-10-12 DIAGNOSIS — N182 Chronic kidney disease, stage 2 (mild): Secondary | ICD-10-CM | POA: Diagnosis not present

## 2017-10-12 DIAGNOSIS — E785 Hyperlipidemia, unspecified: Secondary | ICD-10-CM | POA: Diagnosis not present

## 2017-10-12 DIAGNOSIS — Z794 Long term (current) use of insulin: Secondary | ICD-10-CM | POA: Diagnosis not present

## 2017-10-12 DIAGNOSIS — I10 Essential (primary) hypertension: Secondary | ICD-10-CM | POA: Diagnosis not present

## 2017-10-12 DIAGNOSIS — G5603 Carpal tunnel syndrome, bilateral upper limbs: Secondary | ICD-10-CM | POA: Diagnosis not present

## 2017-10-12 DIAGNOSIS — E1122 Type 2 diabetes mellitus with diabetic chronic kidney disease: Secondary | ICD-10-CM | POA: Diagnosis not present

## 2017-10-12 DIAGNOSIS — E1169 Type 2 diabetes mellitus with other specified complication: Secondary | ICD-10-CM | POA: Diagnosis not present

## 2017-11-08 DIAGNOSIS — L400 Psoriasis vulgaris: Secondary | ICD-10-CM | POA: Diagnosis not present

## 2017-11-08 DIAGNOSIS — D2262 Melanocytic nevi of left upper limb, including shoulder: Secondary | ICD-10-CM | POA: Diagnosis not present

## 2017-11-08 DIAGNOSIS — I8310 Varicose veins of unspecified lower extremity with inflammation: Secondary | ICD-10-CM | POA: Diagnosis not present

## 2017-11-08 DIAGNOSIS — D2261 Melanocytic nevi of right upper limb, including shoulder: Secondary | ICD-10-CM | POA: Diagnosis not present

## 2017-11-08 DIAGNOSIS — Z85828 Personal history of other malignant neoplasm of skin: Secondary | ICD-10-CM | POA: Diagnosis not present

## 2017-11-08 DIAGNOSIS — L821 Other seborrheic keratosis: Secondary | ICD-10-CM | POA: Diagnosis not present

## 2017-11-08 DIAGNOSIS — Z08 Encounter for follow-up examination after completed treatment for malignant neoplasm: Secondary | ICD-10-CM | POA: Diagnosis not present

## 2017-11-08 DIAGNOSIS — D2272 Melanocytic nevi of left lower limb, including hip: Secondary | ICD-10-CM | POA: Diagnosis not present

## 2017-11-08 DIAGNOSIS — D2271 Melanocytic nevi of right lower limb, including hip: Secondary | ICD-10-CM | POA: Diagnosis not present

## 2017-11-08 DIAGNOSIS — L309 Dermatitis, unspecified: Secondary | ICD-10-CM | POA: Diagnosis not present

## 2017-11-24 ENCOUNTER — Other Ambulatory Visit: Payer: Self-pay | Admitting: Internal Medicine

## 2017-11-24 DIAGNOSIS — Z1231 Encounter for screening mammogram for malignant neoplasm of breast: Secondary | ICD-10-CM

## 2018-01-06 ENCOUNTER — Ambulatory Visit
Admission: RE | Admit: 2018-01-06 | Discharge: 2018-01-06 | Disposition: A | Discharge status: Home / Self Care | Payer: Medicare HMO | Source: Ambulatory Visit | Attending: Internal Medicine | Admitting: Internal Medicine

## 2018-01-06 DIAGNOSIS — Z1231 Encounter for screening mammogram for malignant neoplasm of breast: Secondary | ICD-10-CM | POA: Insufficient documentation

## 2018-01-13 DIAGNOSIS — Z23 Encounter for immunization: Secondary | ICD-10-CM | POA: Diagnosis not present

## 2018-02-08 DIAGNOSIS — N182 Chronic kidney disease, stage 2 (mild): Secondary | ICD-10-CM | POA: Diagnosis not present

## 2018-02-08 DIAGNOSIS — E1122 Type 2 diabetes mellitus with diabetic chronic kidney disease: Secondary | ICD-10-CM | POA: Diagnosis not present

## 2018-02-08 DIAGNOSIS — E785 Hyperlipidemia, unspecified: Secondary | ICD-10-CM | POA: Diagnosis not present

## 2018-02-08 DIAGNOSIS — Z794 Long term (current) use of insulin: Secondary | ICD-10-CM | POA: Diagnosis not present

## 2018-02-08 DIAGNOSIS — E1169 Type 2 diabetes mellitus with other specified complication: Secondary | ICD-10-CM | POA: Diagnosis not present

## 2018-02-08 DIAGNOSIS — I10 Essential (primary) hypertension: Secondary | ICD-10-CM | POA: Diagnosis not present

## 2018-02-15 DIAGNOSIS — E1122 Type 2 diabetes mellitus with diabetic chronic kidney disease: Secondary | ICD-10-CM | POA: Diagnosis not present

## 2018-02-15 DIAGNOSIS — E785 Hyperlipidemia, unspecified: Secondary | ICD-10-CM | POA: Diagnosis not present

## 2018-02-15 DIAGNOSIS — E1169 Type 2 diabetes mellitus with other specified complication: Secondary | ICD-10-CM | POA: Diagnosis not present

## 2018-02-15 DIAGNOSIS — Z794 Long term (current) use of insulin: Secondary | ICD-10-CM | POA: Diagnosis not present

## 2018-02-15 DIAGNOSIS — M19071 Primary osteoarthritis, right ankle and foot: Secondary | ICD-10-CM | POA: Diagnosis not present

## 2018-02-15 DIAGNOSIS — N182 Chronic kidney disease, stage 2 (mild): Secondary | ICD-10-CM | POA: Diagnosis not present

## 2018-02-15 DIAGNOSIS — I10 Essential (primary) hypertension: Secondary | ICD-10-CM | POA: Diagnosis not present

## 2018-02-15 DIAGNOSIS — Z0000 Encounter for general adult medical examination without abnormal findings: Secondary | ICD-10-CM | POA: Diagnosis not present

## 2018-02-15 DIAGNOSIS — M79674 Pain in right toe(s): Secondary | ICD-10-CM | POA: Diagnosis not present

## 2018-05-03 DIAGNOSIS — N182 Chronic kidney disease, stage 2 (mild): Secondary | ICD-10-CM | POA: Diagnosis not present

## 2018-05-03 DIAGNOSIS — E1122 Type 2 diabetes mellitus with diabetic chronic kidney disease: Secondary | ICD-10-CM | POA: Diagnosis not present

## 2018-05-03 DIAGNOSIS — I10 Essential (primary) hypertension: Secondary | ICD-10-CM | POA: Diagnosis not present

## 2018-05-03 DIAGNOSIS — Z794 Long term (current) use of insulin: Secondary | ICD-10-CM | POA: Diagnosis not present

## 2018-05-03 DIAGNOSIS — H8110 Benign paroxysmal vertigo, unspecified ear: Secondary | ICD-10-CM | POA: Diagnosis not present

## 2018-06-06 DIAGNOSIS — H9319 Tinnitus, unspecified ear: Secondary | ICD-10-CM | POA: Diagnosis not present

## 2018-06-06 DIAGNOSIS — H903 Sensorineural hearing loss, bilateral: Secondary | ICD-10-CM | POA: Diagnosis not present

## 2018-06-13 DIAGNOSIS — N182 Chronic kidney disease, stage 2 (mild): Secondary | ICD-10-CM | POA: Diagnosis not present

## 2018-06-13 DIAGNOSIS — I10 Essential (primary) hypertension: Secondary | ICD-10-CM | POA: Diagnosis not present

## 2018-06-13 DIAGNOSIS — E785 Hyperlipidemia, unspecified: Secondary | ICD-10-CM | POA: Diagnosis not present

## 2018-06-13 DIAGNOSIS — Z794 Long term (current) use of insulin: Secondary | ICD-10-CM | POA: Diagnosis not present

## 2018-06-13 DIAGNOSIS — E1169 Type 2 diabetes mellitus with other specified complication: Secondary | ICD-10-CM | POA: Diagnosis not present

## 2018-06-13 DIAGNOSIS — E1122 Type 2 diabetes mellitus with diabetic chronic kidney disease: Secondary | ICD-10-CM | POA: Diagnosis not present

## 2018-06-20 DIAGNOSIS — E785 Hyperlipidemia, unspecified: Secondary | ICD-10-CM | POA: Diagnosis not present

## 2018-06-20 DIAGNOSIS — N182 Chronic kidney disease, stage 2 (mild): Secondary | ICD-10-CM | POA: Diagnosis not present

## 2018-06-20 DIAGNOSIS — I10 Essential (primary) hypertension: Secondary | ICD-10-CM | POA: Diagnosis not present

## 2018-06-20 DIAGNOSIS — E1122 Type 2 diabetes mellitus with diabetic chronic kidney disease: Secondary | ICD-10-CM | POA: Diagnosis not present

## 2018-06-20 DIAGNOSIS — Z794 Long term (current) use of insulin: Secondary | ICD-10-CM | POA: Diagnosis not present

## 2018-06-20 DIAGNOSIS — H9311 Tinnitus, right ear: Secondary | ICD-10-CM | POA: Insufficient documentation

## 2018-06-20 DIAGNOSIS — E1169 Type 2 diabetes mellitus with other specified complication: Secondary | ICD-10-CM | POA: Diagnosis not present

## 2018-07-24 IMAGING — CT CT ABD-PELV W/ CM
1 of 3 series · 14 of 32 positions shown, 19 images · IV contrast (APPLIED)
Comparison: None.

CLINICAL DATA: Right colonic mass on recent colonoscopy.

EXAM:
CT ABDOMEN AND PELVIS WITH CONTRAST
TECHNIQUE: Multidetector CT imaging of the abdomen and pelvis was performed
using the standard protocol following bolus administration of
intravenous contrast.
CONTRAST:  100mL GYDGBG-ZZZ IOPAMIDOL (GYDGBG-ZZZ) INJECTION 61%

[Series 2: axial st · axial · 0.73mm/px · z∈[-911,-496]mm · 14 of 95 slices shown, 19 images]
[im 6/95  soft-tissue]
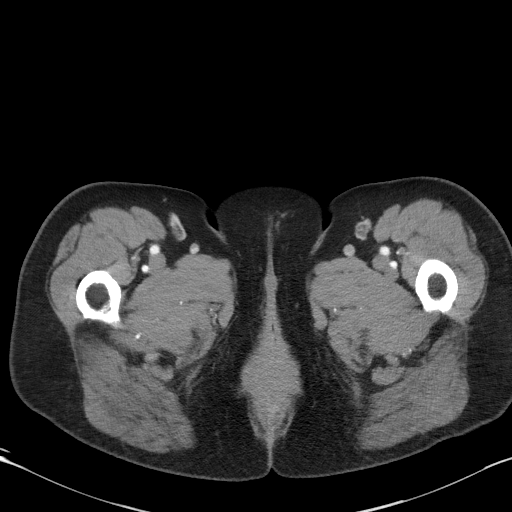
[im 6/95  bone]
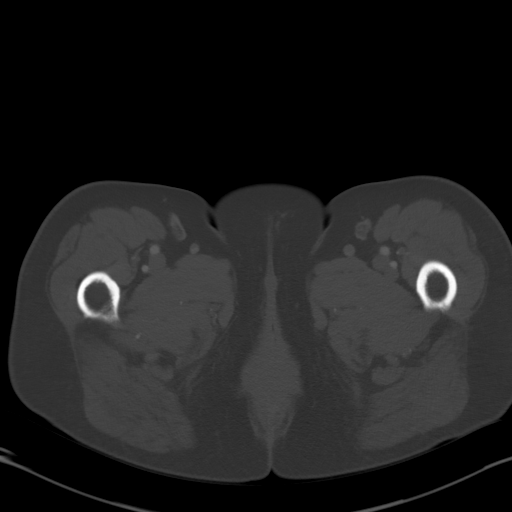
[im 11/95  soft-tissue]
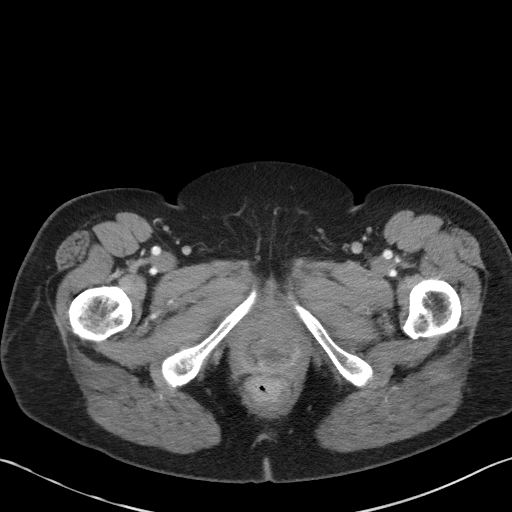
[im 21/95  soft-tissue]
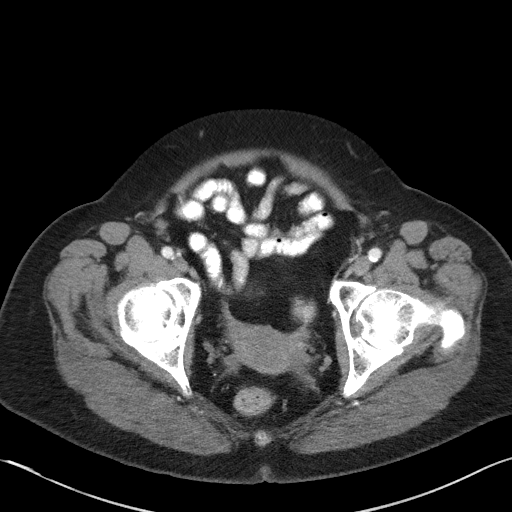
[im 27/95  soft-tissue]
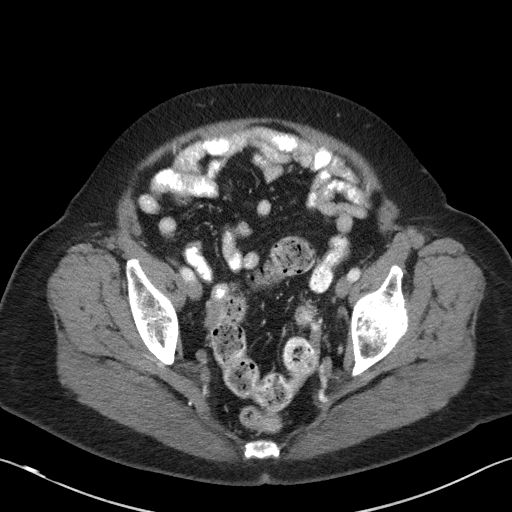
[im 32/95  soft-tissue]
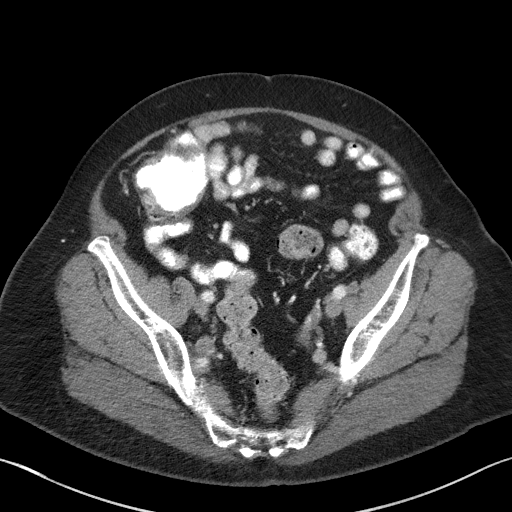
[im 42/95  soft-tissue]
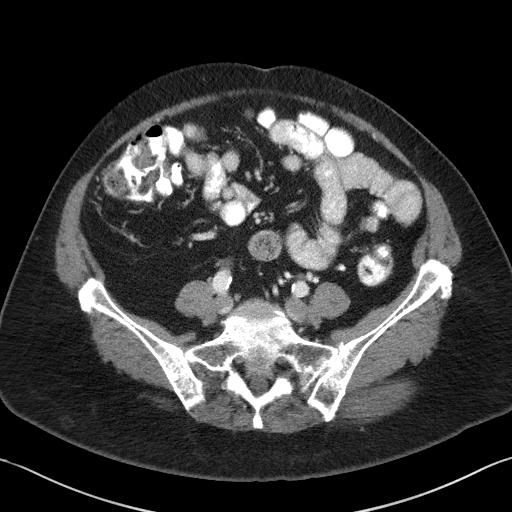
[im 48/95  soft-tissue]
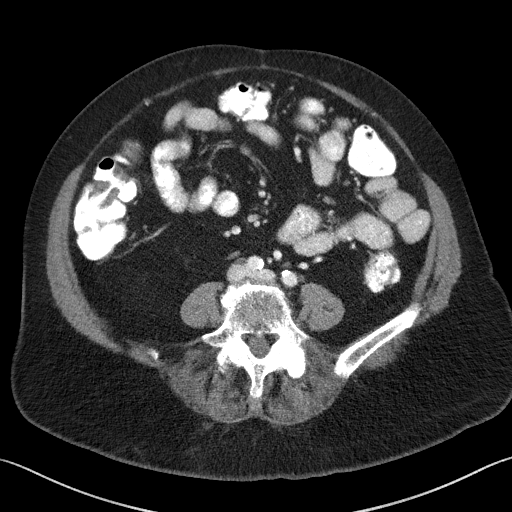
[im 53/95  soft-tissue]
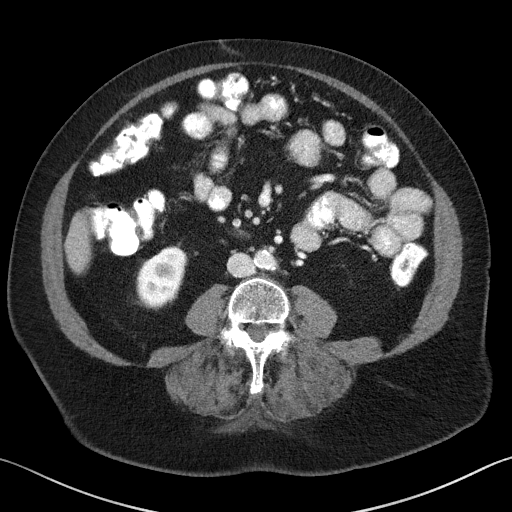
[im 63/95  soft-tissue]
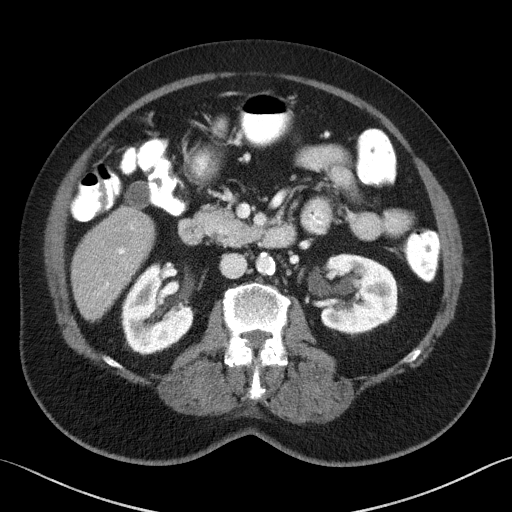
[im 63/95  bone]
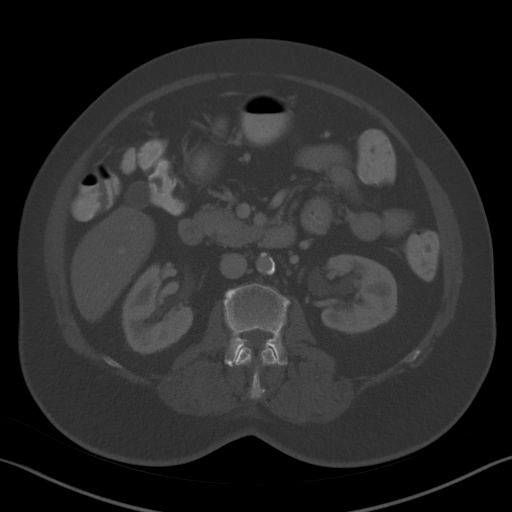
[im 68/95  soft-tissue]
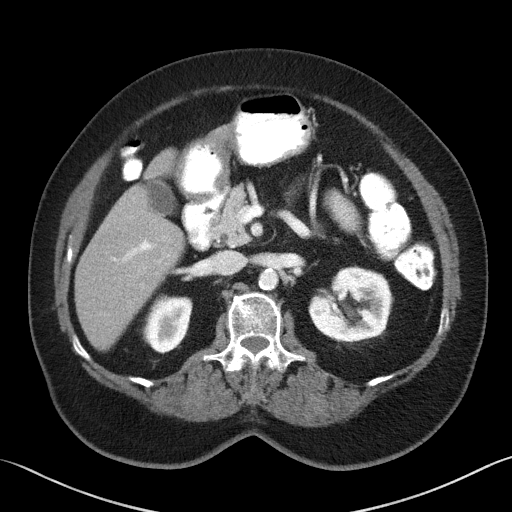
[im 74/95  soft-tissue]
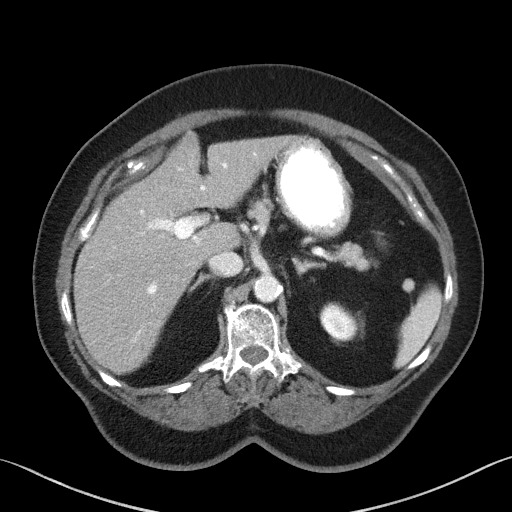
[im 74/95  lung]
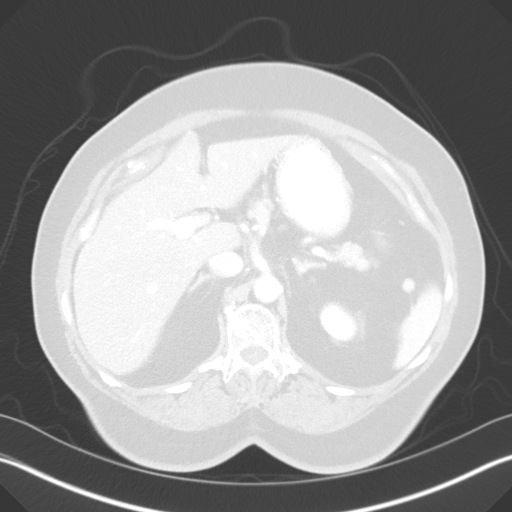
[im 79/95  lung]
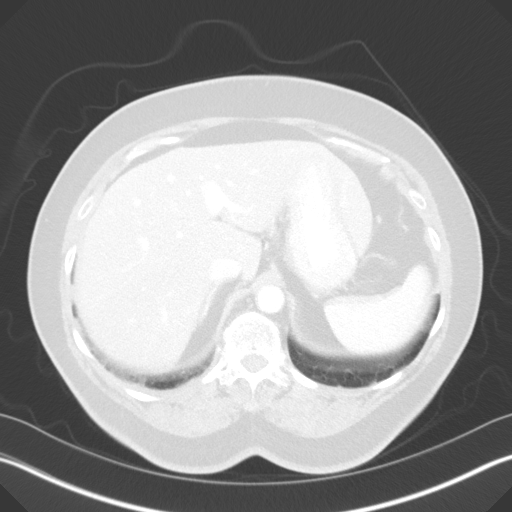
[im 84/95  soft-tissue]
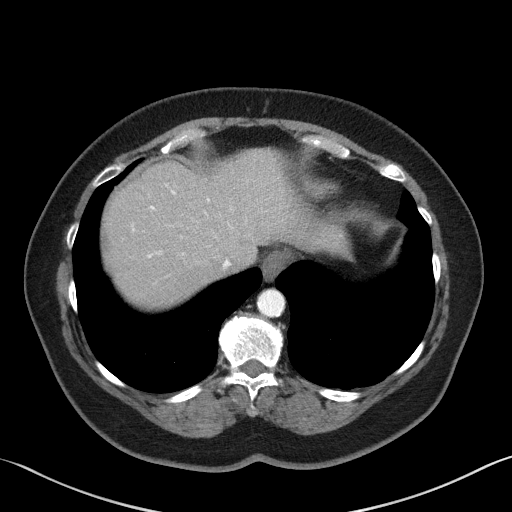
[im 84/95  lung]
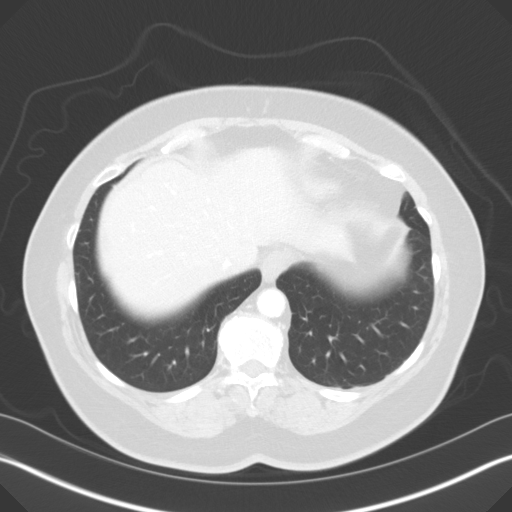
[im 89/95  soft-tissue]
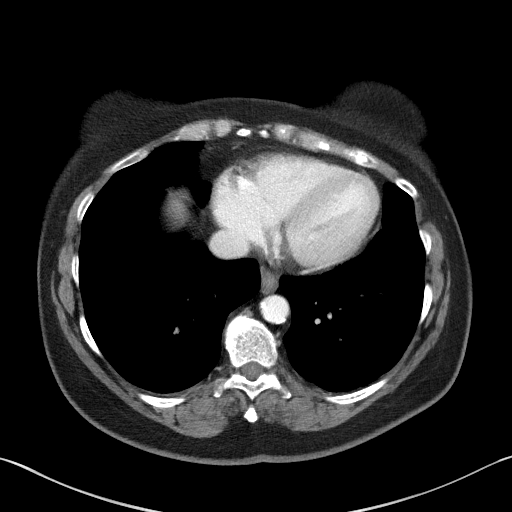
[im 89/95  lung]
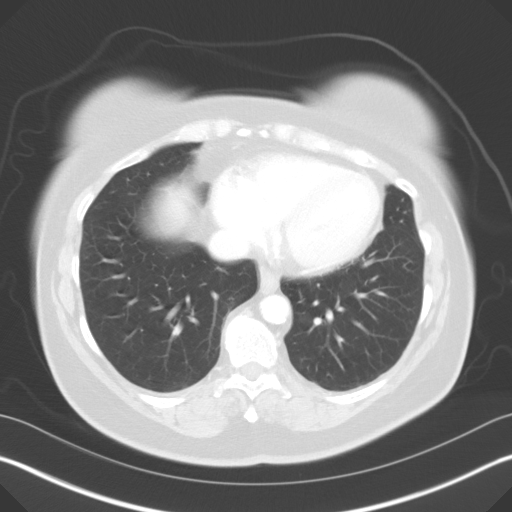

[14 of 32 positions shown; findings below may reference images not displayed]

FINDINGS: Lower Chest: No acute findings.

Hepatobiliary: No hepatic masses identified. Gallbladder is
unremarkable.

Pancreas:  No mass or inflammatory changes.

Spleen: Within normal limits in size and appearance.

Adrenals/Urinary Tract: No masses identified. No evidence of
hydronephrosis.

Stomach/Bowel: No evidence of obstruction, mass, inflammatory
process or abnormal fluid collections.

Vascular/Lymphatic: No pathologically enlarged lymph nodes. No
abdominal aortic aneurysm. Aortic atherosclerosis.

Reproductive:  No mass or other significant abnormality.

Other:  None.

Musculoskeletal:  No suspicious bone lesions identified.
IMPRESSION: No evidence of metastatic disease or other acute findings.

Aortic atherosclerosis.

## 2018-10-11 DIAGNOSIS — E785 Hyperlipidemia, unspecified: Secondary | ICD-10-CM | POA: Diagnosis not present

## 2018-10-11 DIAGNOSIS — E1122 Type 2 diabetes mellitus with diabetic chronic kidney disease: Secondary | ICD-10-CM | POA: Diagnosis not present

## 2018-10-11 DIAGNOSIS — E1169 Type 2 diabetes mellitus with other specified complication: Secondary | ICD-10-CM | POA: Diagnosis not present

## 2018-10-11 DIAGNOSIS — N182 Chronic kidney disease, stage 2 (mild): Secondary | ICD-10-CM | POA: Diagnosis not present

## 2018-10-11 DIAGNOSIS — Z794 Long term (current) use of insulin: Secondary | ICD-10-CM | POA: Diagnosis not present

## 2018-10-11 DIAGNOSIS — I10 Essential (primary) hypertension: Secondary | ICD-10-CM | POA: Diagnosis not present

## 2018-10-17 DIAGNOSIS — D692 Other nonthrombocytopenic purpura: Secondary | ICD-10-CM | POA: Diagnosis not present

## 2018-10-17 DIAGNOSIS — X32XXXA Exposure to sunlight, initial encounter: Secondary | ICD-10-CM | POA: Diagnosis not present

## 2018-10-17 DIAGNOSIS — L570 Actinic keratosis: Secondary | ICD-10-CM | POA: Diagnosis not present

## 2018-10-17 DIAGNOSIS — Z85828 Personal history of other malignant neoplasm of skin: Secondary | ICD-10-CM | POA: Diagnosis not present

## 2018-10-17 DIAGNOSIS — Z08 Encounter for follow-up examination after completed treatment for malignant neoplasm: Secondary | ICD-10-CM | POA: Diagnosis not present

## 2018-10-17 DIAGNOSIS — D2261 Melanocytic nevi of right upper limb, including shoulder: Secondary | ICD-10-CM | POA: Diagnosis not present

## 2018-10-17 DIAGNOSIS — D225 Melanocytic nevi of trunk: Secondary | ICD-10-CM | POA: Diagnosis not present

## 2018-10-17 DIAGNOSIS — D2262 Melanocytic nevi of left upper limb, including shoulder: Secondary | ICD-10-CM | POA: Diagnosis not present

## 2018-10-18 DIAGNOSIS — G8929 Other chronic pain: Secondary | ICD-10-CM | POA: Diagnosis not present

## 2018-10-18 DIAGNOSIS — M1611 Unilateral primary osteoarthritis, right hip: Secondary | ICD-10-CM | POA: Diagnosis not present

## 2018-10-18 DIAGNOSIS — I10 Essential (primary) hypertension: Secondary | ICD-10-CM | POA: Diagnosis not present

## 2018-10-18 DIAGNOSIS — E1122 Type 2 diabetes mellitus with diabetic chronic kidney disease: Secondary | ICD-10-CM | POA: Diagnosis not present

## 2018-10-18 DIAGNOSIS — N182 Chronic kidney disease, stage 2 (mild): Secondary | ICD-10-CM | POA: Diagnosis not present

## 2018-10-18 DIAGNOSIS — Z794 Long term (current) use of insulin: Secondary | ICD-10-CM | POA: Diagnosis not present

## 2018-10-18 DIAGNOSIS — E1169 Type 2 diabetes mellitus with other specified complication: Secondary | ICD-10-CM | POA: Diagnosis not present

## 2018-10-18 DIAGNOSIS — E785 Hyperlipidemia, unspecified: Secondary | ICD-10-CM | POA: Diagnosis not present

## 2018-10-18 DIAGNOSIS — M25551 Pain in right hip: Secondary | ICD-10-CM | POA: Diagnosis not present

## 2018-10-25 DIAGNOSIS — M1611 Unilateral primary osteoarthritis, right hip: Secondary | ICD-10-CM | POA: Diagnosis not present

## 2018-10-25 DIAGNOSIS — M25551 Pain in right hip: Secondary | ICD-10-CM | POA: Diagnosis not present

## 2018-11-20 DIAGNOSIS — H52223 Regular astigmatism, bilateral: Secondary | ICD-10-CM | POA: Diagnosis not present

## 2018-11-20 DIAGNOSIS — Z7984 Long term (current) use of oral hypoglycemic drugs: Secondary | ICD-10-CM | POA: Diagnosis not present

## 2018-11-20 DIAGNOSIS — Z0100 Encounter for examination of eyes and vision without abnormal findings: Secondary | ICD-10-CM | POA: Diagnosis not present

## 2018-11-20 DIAGNOSIS — H2513 Age-related nuclear cataract, bilateral: Secondary | ICD-10-CM | POA: Diagnosis not present

## 2018-11-20 DIAGNOSIS — H5203 Hypermetropia, bilateral: Secondary | ICD-10-CM | POA: Diagnosis not present

## 2018-11-20 DIAGNOSIS — E119 Type 2 diabetes mellitus without complications: Secondary | ICD-10-CM | POA: Diagnosis not present

## 2018-11-20 DIAGNOSIS — H524 Presbyopia: Secondary | ICD-10-CM | POA: Diagnosis not present

## 2018-11-30 ENCOUNTER — Other Ambulatory Visit: Payer: Self-pay | Admitting: Internal Medicine

## 2018-11-30 DIAGNOSIS — Z1231 Encounter for screening mammogram for malignant neoplasm of breast: Secondary | ICD-10-CM

## 2018-12-29 DIAGNOSIS — Z23 Encounter for immunization: Secondary | ICD-10-CM | POA: Diagnosis not present

## 2019-01-08 ENCOUNTER — Ambulatory Visit
Admission: RE | Admit: 2019-01-08 | Discharge: 2019-01-08 | Disposition: A | Discharge status: Home / Self Care | Payer: Medicare HMO | Source: Ambulatory Visit | Attending: Internal Medicine | Admitting: Internal Medicine

## 2019-01-08 DIAGNOSIS — Z1231 Encounter for screening mammogram for malignant neoplasm of breast: Secondary | ICD-10-CM | POA: Diagnosis not present

## 2019-02-16 DIAGNOSIS — E785 Hyperlipidemia, unspecified: Secondary | ICD-10-CM | POA: Diagnosis not present

## 2019-02-16 DIAGNOSIS — E1169 Type 2 diabetes mellitus with other specified complication: Secondary | ICD-10-CM | POA: Diagnosis not present

## 2019-02-16 DIAGNOSIS — Z794 Long term (current) use of insulin: Secondary | ICD-10-CM | POA: Diagnosis not present

## 2019-02-16 DIAGNOSIS — N182 Chronic kidney disease, stage 2 (mild): Secondary | ICD-10-CM | POA: Diagnosis not present

## 2019-02-16 DIAGNOSIS — E1122 Type 2 diabetes mellitus with diabetic chronic kidney disease: Secondary | ICD-10-CM | POA: Diagnosis not present

## 2019-02-16 DIAGNOSIS — I10 Essential (primary) hypertension: Secondary | ICD-10-CM | POA: Diagnosis not present

## 2019-02-23 DIAGNOSIS — E1169 Type 2 diabetes mellitus with other specified complication: Secondary | ICD-10-CM | POA: Diagnosis not present

## 2019-02-23 DIAGNOSIS — Z794 Long term (current) use of insulin: Secondary | ICD-10-CM | POA: Diagnosis not present

## 2019-02-23 DIAGNOSIS — N182 Chronic kidney disease, stage 2 (mild): Secondary | ICD-10-CM | POA: Diagnosis not present

## 2019-02-23 DIAGNOSIS — E1122 Type 2 diabetes mellitus with diabetic chronic kidney disease: Secondary | ICD-10-CM | POA: Diagnosis not present

## 2019-02-23 DIAGNOSIS — Z0000 Encounter for general adult medical examination without abnormal findings: Secondary | ICD-10-CM | POA: Diagnosis not present

## 2019-02-23 DIAGNOSIS — I10 Essential (primary) hypertension: Secondary | ICD-10-CM | POA: Diagnosis not present

## 2019-02-23 DIAGNOSIS — E785 Hyperlipidemia, unspecified: Secondary | ICD-10-CM | POA: Diagnosis not present

## 2019-03-02 DIAGNOSIS — Z20828 Contact with and (suspected) exposure to other viral communicable diseases: Secondary | ICD-10-CM | POA: Diagnosis not present

## 2019-05-10 ENCOUNTER — Ambulatory Visit: Payer: Medicare HMO

## 2019-05-19 ENCOUNTER — Ambulatory Visit: Payer: Medicare HMO | Attending: Internal Medicine

## 2019-05-19 DIAGNOSIS — Z23 Encounter for immunization: Secondary | ICD-10-CM | POA: Insufficient documentation

## 2019-05-19 NOTE — Progress Notes (Signed)
   Covid-19 Vaccination Clinic  Name:  Morgan Nicholson    MRN: [id number] DOB: November 07, 1945  05/19/2019  Morgan Nicholson was observed post Covid-19 immunization for 15 minutes without incidence. She was provided with Vaccine Information Sheet and instruction to access the V-Safe system.   Morgan Nicholson was instructed to call 911 with any severe reactions post vaccine: Marland Kitchen Difficulty breathing  . Swelling of your face and throat  . A fast heartbeat  . A bad rash all over your body  . Dizziness and weakness    Immunizations Administered    Name Date Dose VIS Date Route   Pfizer COVID-19 Vaccine 05/19/2019  8:54 AM 0.3 mL 03/23/2019 Intramuscular   Manufacturer: [place]   Lot: [id number]   [place]: [id number]

## 2019-05-21 ENCOUNTER — Ambulatory Visit: Payer: Medicare HMO

## 2019-05-31 ENCOUNTER — Ambulatory Visit: Payer: Medicare HMO

## 2019-06-12 ENCOUNTER — Ambulatory Visit: Payer: Medicare HMO | Attending: Internal Medicine

## 2019-06-12 ENCOUNTER — Ambulatory Visit: Payer: Medicare HMO

## 2019-06-12 DIAGNOSIS — Z23 Encounter for immunization: Secondary | ICD-10-CM

## 2019-06-12 NOTE — Progress Notes (Signed)
   Covid-19 Vaccination Clinic  Name:  Morgan Nicholson    MRN: [id number] DOB: Oct 11, 1945  06/12/2019  [name] was observed post Covid-19 immunization for 15 minutes without incident. She was provided with Vaccine Information Sheet and instruction to access the V-Safe system.   [name] was instructed to call 911 with any severe reactions post vaccine: Marland Kitchen Difficulty breathing  . Swelling of face and throat  . A fast heartbeat  . A bad rash all over body  . Dizziness and weakness   Immunizations Administered    Name Date Dose VIS Date Route   Pfizer COVID-19 Vaccine 06/12/2019 12:35 AM 0.3 mL 03/23/2019 Intramuscular   Manufacturer: [place]   Lot: [id number]   [place]: [id number]

## 2019-06-19 DIAGNOSIS — I10 Essential (primary) hypertension: Secondary | ICD-10-CM | POA: Diagnosis not present

## 2019-06-19 DIAGNOSIS — N182 Chronic kidney disease, stage 2 (mild): Secondary | ICD-10-CM | POA: Diagnosis not present

## 2019-06-19 DIAGNOSIS — E1169 Type 2 diabetes mellitus with other specified complication: Secondary | ICD-10-CM | POA: Diagnosis not present

## 2019-06-19 DIAGNOSIS — E785 Hyperlipidemia, unspecified: Secondary | ICD-10-CM | POA: Diagnosis not present

## 2019-06-19 DIAGNOSIS — Z794 Long term (current) use of insulin: Secondary | ICD-10-CM | POA: Diagnosis not present

## 2019-06-19 DIAGNOSIS — E1122 Type 2 diabetes mellitus with diabetic chronic kidney disease: Secondary | ICD-10-CM | POA: Diagnosis not present

## 2019-06-26 DIAGNOSIS — E1169 Type 2 diabetes mellitus with other specified complication: Secondary | ICD-10-CM | POA: Diagnosis not present

## 2019-06-26 DIAGNOSIS — N182 Chronic kidney disease, stage 2 (mild): Secondary | ICD-10-CM | POA: Diagnosis not present

## 2019-06-26 DIAGNOSIS — M65311 Trigger thumb, right thumb: Secondary | ICD-10-CM | POA: Diagnosis not present

## 2019-06-26 DIAGNOSIS — I129 Hypertensive chronic kidney disease with stage 1 through stage 4 chronic kidney disease, or unspecified chronic kidney disease: Secondary | ICD-10-CM | POA: Diagnosis not present

## 2019-06-26 DIAGNOSIS — Z794 Long term (current) use of insulin: Secondary | ICD-10-CM | POA: Diagnosis not present

## 2019-06-26 DIAGNOSIS — E1122 Type 2 diabetes mellitus with diabetic chronic kidney disease: Secondary | ICD-10-CM | POA: Diagnosis not present

## 2019-06-26 DIAGNOSIS — E785 Hyperlipidemia, unspecified: Secondary | ICD-10-CM | POA: Diagnosis not present

## 2019-07-16 DIAGNOSIS — M65311 Trigger thumb, right thumb: Secondary | ICD-10-CM | POA: Diagnosis not present

## 2019-07-27 DIAGNOSIS — Z794 Long term (current) use of insulin: Secondary | ICD-10-CM | POA: Diagnosis not present

## 2019-07-27 DIAGNOSIS — N182 Chronic kidney disease, stage 2 (mild): Secondary | ICD-10-CM | POA: Diagnosis not present

## 2019-07-27 DIAGNOSIS — J029 Acute pharyngitis, unspecified: Secondary | ICD-10-CM | POA: Diagnosis not present

## 2019-07-27 DIAGNOSIS — E1122 Type 2 diabetes mellitus with diabetic chronic kidney disease: Secondary | ICD-10-CM | POA: Diagnosis not present

## 2019-10-16 DIAGNOSIS — D2261 Melanocytic nevi of right upper limb, including shoulder: Secondary | ICD-10-CM | POA: Diagnosis not present

## 2019-10-16 DIAGNOSIS — D2262 Melanocytic nevi of left upper limb, including shoulder: Secondary | ICD-10-CM | POA: Diagnosis not present

## 2019-10-16 DIAGNOSIS — L820 Inflamed seborrheic keratosis: Secondary | ICD-10-CM | POA: Diagnosis not present

## 2019-10-16 DIAGNOSIS — L400 Psoriasis vulgaris: Secondary | ICD-10-CM | POA: Diagnosis not present

## 2019-10-16 DIAGNOSIS — D485 Neoplasm of uncertain behavior of skin: Secondary | ICD-10-CM | POA: Diagnosis not present

## 2019-10-16 DIAGNOSIS — D225 Melanocytic nevi of trunk: Secondary | ICD-10-CM | POA: Diagnosis not present

## 2019-10-16 DIAGNOSIS — Z85828 Personal history of other malignant neoplasm of skin: Secondary | ICD-10-CM | POA: Diagnosis not present

## 2019-10-16 DIAGNOSIS — Z08 Encounter for follow-up examination after completed treatment for malignant neoplasm: Secondary | ICD-10-CM | POA: Diagnosis not present

## 2019-10-16 DIAGNOSIS — L821 Other seborrheic keratosis: Secondary | ICD-10-CM | POA: Diagnosis not present

## 2019-10-16 DIAGNOSIS — C44619 Basal cell carcinoma of skin of left upper limb, including shoulder: Secondary | ICD-10-CM | POA: Diagnosis not present

## 2019-10-17 DIAGNOSIS — E785 Hyperlipidemia, unspecified: Secondary | ICD-10-CM | POA: Diagnosis not present

## 2019-10-17 DIAGNOSIS — N182 Chronic kidney disease, stage 2 (mild): Secondary | ICD-10-CM | POA: Diagnosis not present

## 2019-10-17 DIAGNOSIS — E1169 Type 2 diabetes mellitus with other specified complication: Secondary | ICD-10-CM | POA: Diagnosis not present

## 2019-10-17 DIAGNOSIS — E1122 Type 2 diabetes mellitus with diabetic chronic kidney disease: Secondary | ICD-10-CM | POA: Diagnosis not present

## 2019-10-17 DIAGNOSIS — Z794 Long term (current) use of insulin: Secondary | ICD-10-CM | POA: Diagnosis not present

## 2019-10-17 DIAGNOSIS — I10 Essential (primary) hypertension: Secondary | ICD-10-CM | POA: Diagnosis not present

## 2019-10-23 DIAGNOSIS — C44619 Basal cell carcinoma of skin of left upper limb, including shoulder: Secondary | ICD-10-CM | POA: Diagnosis not present

## 2019-10-24 DIAGNOSIS — E785 Hyperlipidemia, unspecified: Secondary | ICD-10-CM | POA: Diagnosis not present

## 2019-10-24 DIAGNOSIS — I129 Hypertensive chronic kidney disease with stage 1 through stage 4 chronic kidney disease, or unspecified chronic kidney disease: Secondary | ICD-10-CM | POA: Diagnosis not present

## 2019-10-24 DIAGNOSIS — E1169 Type 2 diabetes mellitus with other specified complication: Secondary | ICD-10-CM | POA: Diagnosis not present

## 2019-10-24 DIAGNOSIS — N182 Chronic kidney disease, stage 2 (mild): Secondary | ICD-10-CM | POA: Diagnosis not present

## 2019-10-24 DIAGNOSIS — Z794 Long term (current) use of insulin: Secondary | ICD-10-CM | POA: Diagnosis not present

## 2019-10-24 DIAGNOSIS — E1122 Type 2 diabetes mellitus with diabetic chronic kidney disease: Secondary | ICD-10-CM | POA: Diagnosis not present

## 2019-12-06 ENCOUNTER — Other Ambulatory Visit: Payer: Self-pay | Admitting: Internal Medicine

## 2019-12-06 DIAGNOSIS — Z1231 Encounter for screening mammogram for malignant neoplasm of breast: Secondary | ICD-10-CM

## 2019-12-26 DIAGNOSIS — Z23 Encounter for immunization: Secondary | ICD-10-CM | POA: Diagnosis not present

## 2020-01-09 ENCOUNTER — Ambulatory Visit
Admission: RE | Admit: 2020-01-09 | Discharge: 2020-01-09 | Disposition: A | Discharge status: Home / Self Care | Payer: Medicare HMO | Source: Ambulatory Visit | Attending: Internal Medicine | Admitting: Internal Medicine

## 2020-01-09 DIAGNOSIS — Z1231 Encounter for screening mammogram for malignant neoplasm of breast: Secondary | ICD-10-CM | POA: Diagnosis not present

## 2020-01-15 DIAGNOSIS — H524 Presbyopia: Secondary | ICD-10-CM | POA: Diagnosis not present

## 2020-01-15 DIAGNOSIS — H52223 Regular astigmatism, bilateral: Secondary | ICD-10-CM | POA: Diagnosis not present

## 2020-01-15 DIAGNOSIS — H2513 Age-related nuclear cataract, bilateral: Secondary | ICD-10-CM | POA: Diagnosis not present

## 2020-01-15 DIAGNOSIS — H5203 Hypermetropia, bilateral: Secondary | ICD-10-CM | POA: Diagnosis not present

## 2020-01-15 DIAGNOSIS — Z7984 Long term (current) use of oral hypoglycemic drugs: Secondary | ICD-10-CM | POA: Diagnosis not present

## 2020-01-15 DIAGNOSIS — E119 Type 2 diabetes mellitus without complications: Secondary | ICD-10-CM | POA: Diagnosis not present

## 2020-03-05 DIAGNOSIS — I10 Essential (primary) hypertension: Secondary | ICD-10-CM | POA: Diagnosis not present

## 2020-03-05 DIAGNOSIS — E785 Hyperlipidemia, unspecified: Secondary | ICD-10-CM | POA: Diagnosis not present

## 2020-03-05 DIAGNOSIS — Z794 Long term (current) use of insulin: Secondary | ICD-10-CM | POA: Diagnosis not present

## 2020-03-05 DIAGNOSIS — N182 Chronic kidney disease, stage 2 (mild): Secondary | ICD-10-CM | POA: Diagnosis not present

## 2020-03-05 DIAGNOSIS — E1169 Type 2 diabetes mellitus with other specified complication: Secondary | ICD-10-CM | POA: Diagnosis not present

## 2020-03-05 DIAGNOSIS — E1122 Type 2 diabetes mellitus with diabetic chronic kidney disease: Secondary | ICD-10-CM | POA: Diagnosis not present

## 2020-03-11 DIAGNOSIS — N182 Chronic kidney disease, stage 2 (mild): Secondary | ICD-10-CM | POA: Diagnosis not present

## 2020-03-11 DIAGNOSIS — E1169 Type 2 diabetes mellitus with other specified complication: Secondary | ICD-10-CM | POA: Diagnosis not present

## 2020-03-11 DIAGNOSIS — E1122 Type 2 diabetes mellitus with diabetic chronic kidney disease: Secondary | ICD-10-CM | POA: Diagnosis not present

## 2020-03-11 DIAGNOSIS — I129 Hypertensive chronic kidney disease with stage 1 through stage 4 chronic kidney disease, or unspecified chronic kidney disease: Secondary | ICD-10-CM | POA: Diagnosis not present

## 2020-03-11 DIAGNOSIS — Z794 Long term (current) use of insulin: Secondary | ICD-10-CM | POA: Diagnosis not present

## 2020-03-11 DIAGNOSIS — E785 Hyperlipidemia, unspecified: Secondary | ICD-10-CM | POA: Diagnosis not present

## 2020-03-28 DIAGNOSIS — D225 Melanocytic nevi of trunk: Secondary | ICD-10-CM | POA: Diagnosis not present

## 2020-03-28 DIAGNOSIS — L821 Other seborrheic keratosis: Secondary | ICD-10-CM | POA: Diagnosis not present

## 2020-03-28 DIAGNOSIS — D2261 Melanocytic nevi of right upper limb, including shoulder: Secondary | ICD-10-CM | POA: Diagnosis not present

## 2020-03-28 DIAGNOSIS — D2262 Melanocytic nevi of left upper limb, including shoulder: Secondary | ICD-10-CM | POA: Diagnosis not present

## 2020-03-28 DIAGNOSIS — D2272 Melanocytic nevi of left lower limb, including hip: Secondary | ICD-10-CM | POA: Diagnosis not present

## 2020-03-28 DIAGNOSIS — L400 Psoriasis vulgaris: Secondary | ICD-10-CM | POA: Diagnosis not present

## 2020-03-28 DIAGNOSIS — Z85828 Personal history of other malignant neoplasm of skin: Secondary | ICD-10-CM | POA: Diagnosis not present

## 2020-04-21 DIAGNOSIS — M65311 Trigger thumb, right thumb: Secondary | ICD-10-CM | POA: Diagnosis not present

## 2020-04-21 DIAGNOSIS — G5601 Carpal tunnel syndrome, right upper limb: Secondary | ICD-10-CM | POA: Diagnosis not present

## 2020-04-21 DIAGNOSIS — M25541 Pain in joints of right hand: Secondary | ICD-10-CM | POA: Diagnosis not present

## 2020-04-29 ENCOUNTER — Other Ambulatory Visit: Payer: Self-pay

## 2020-04-29 ENCOUNTER — Other Ambulatory Visit: Payer: Self-pay | Admitting: Surgery

## 2020-04-29 ENCOUNTER — Other Ambulatory Visit
Admission: RE | Admit: 2020-04-29 | Discharge: 2020-04-29 | Disposition: A | Discharge status: Home / Self Care | Payer: Medicare HMO | Source: Ambulatory Visit | Attending: Surgery | Admitting: Surgery

## 2020-04-29 DIAGNOSIS — Z01812 Encounter for preprocedural laboratory examination: Secondary | ICD-10-CM | POA: Diagnosis not present

## 2020-04-29 DIAGNOSIS — Z20822 Contact with and (suspected) exposure to covid-19: Secondary | ICD-10-CM | POA: Diagnosis not present

## 2020-04-29 LAB — SARS CORONAVIRUS 2 (TAT 6-24 HRS): SARS Coronavirus 2: NEGATIVE

## 2020-04-30 ENCOUNTER — Encounter
Admission: RE | Admit: 2020-04-30 | Discharge: 2020-04-30 | Disposition: A | Discharge status: Home / Self Care | Payer: Medicare HMO | Source: Ambulatory Visit | Attending: Surgery | Admitting: Surgery

## 2020-04-30 ENCOUNTER — Other Ambulatory Visit: Payer: Self-pay

## 2020-04-30 DIAGNOSIS — E118 Type 2 diabetes mellitus with unspecified complications: Secondary | ICD-10-CM | POA: Insufficient documentation

## 2020-04-30 DIAGNOSIS — I10 Essential (primary) hypertension: Secondary | ICD-10-CM | POA: Insufficient documentation

## 2020-04-30 DIAGNOSIS — Z01818 Encounter for other preprocedural examination: Secondary | ICD-10-CM | POA: Diagnosis not present

## 2020-04-30 DIAGNOSIS — Z01810 Encounter for preprocedural cardiovascular examination: Secondary | ICD-10-CM

## 2020-04-30 NOTE — Patient Instructions (Addendum)
Your procedure is scheduled on: 05/01/20 Report to [place]. To find out your arrival time please call [phone] between 1PM - 3PM on 04/30/20 .  Remember: Instructions that are not followed completely may result in serious medical risk, up to and including death, or upon the discretion of your surgeon and anesthesiologist your surgery may need to be rescheduled.     _X__ 1. Do not eat food after midnight the night before your procedure.                 No gum chewing or hard candies. You may drink clear liquids up to 2 hours                 before you are scheduled to arrive for your surgery- DO not drink clear                 liquids within 2 hours of the start of your surgery.                 Clear Liquids include:  water, apple juice without pulp, clear carbohydrate                 drink such as Clearfast or Gatorade, Black Coffee or Tea (Do not add                 anything to coffee or tea). Diabetics water only  __X__2.  On the morning of surgery brush your teeth with toothpaste and water, you                 may rinse your mouth with mouthwash if you wish.  Do not swallow any              toothpaste of mouthwash.     _X__ 3.  No Alcohol for 24 hours before or after surgery.   _X__ 4.  Do Not Smoke or use e-cigarettes For 24 Hours Prior to Your Surgery.                 Do not use any chewable tobacco products for at least 6 hours prior to                 surgery.  ____  5.  Bring all medications with you on the day of surgery if instructed.   __X__  6.  Notify your doctor if there is any change in your medical condition      (cold, fever, infections).     Do not wear jewelry, make-up, hairpins, clips or nail polish. Do not wear lotions, powders, or perfumes.  Do not shave 48 hours prior to surgery. Men may shave face and neck. Do not bring valuables to the hospital.    [facility] is not responsible for any belongings  or valuables.  Contacts, dentures/partials or body piercings may not be worn into surgery. Bring a case for your contacts, glasses or hearing aids, a denture cup will be supplied. Leave your suitcase in the car. After surgery it may be brought to your room. For patients admitted to the hospital, discharge time is determined by your treatment team.   Patients discharged the day of surgery will not be allowed to drive home.   Please read over the following fact sheets that you were given:   MRSA Information  __X__ Take these medicines the morning of surgery with A SIP OF WATER:  1. carvedilol (COREG) 12.5 MG tablet  2.   3.   4.  5.  6.  ____ Fleet Enema (as directed)   __X__ Use CHG Soap/SAGE wipes as directed  ____ Use inhalers on the day of surgery  __X__ Stop metformin/Janumet/Farxiga 2 days prior to surgery HOLD METFORMIN AND GLIPIZIDE TONIGHT   __X__ Take 1/2 of usual insulin dose the night before surgery. No insulin the morning          of surgery.   ____ Stop Blood Thinners Coumadin/Plavix/Xarelto/Pleta/Pradaxa/Eliquis/Effient/Aspirin  on   Or contact your Surgeon, Cardiologist or Medical Doctor regarding  ability to stop your blood thinners  __X__ Stop Anti-inflammatories 7 days before surgery such as Advil, Ibuprofen, Motrin,  BC or Goodies Powder, Naprosyn, Naproxen, Aleve, Aspirin    __X__ Stop all herbal supplements, fish oil or vitamin E until after surgery.    ____ Bring C-Pap to the hospital.

## 2020-05-01 ENCOUNTER — Ambulatory Visit: Payer: Medicare HMO | Admitting: Certified Registered"

## 2020-05-01 ENCOUNTER — Ambulatory Visit
Admission: RE | Admit: 2020-05-01 | Discharge: 2020-05-01 | Disposition: A | Discharge status: Home / Self Care | Payer: Medicare HMO | Attending: Surgery | Admitting: Surgery

## 2020-05-01 ENCOUNTER — Encounter: Payer: Self-pay | Admitting: Surgery

## 2020-05-01 ENCOUNTER — Other Ambulatory Visit: Payer: Self-pay

## 2020-05-01 ENCOUNTER — Encounter
Admission: RE | Disposition: A | Discharge status: Home / Self Care | Payer: Self-pay | Source: Home / Self Care | Attending: Surgery

## 2020-05-01 DIAGNOSIS — G5601 Carpal tunnel syndrome, right upper limb: Secondary | ICD-10-CM | POA: Insufficient documentation

## 2020-05-01 DIAGNOSIS — M65311 Trigger thumb, right thumb: Secondary | ICD-10-CM | POA: Diagnosis not present

## 2020-05-01 DIAGNOSIS — Z833 Family history of diabetes mellitus: Secondary | ICD-10-CM | POA: Diagnosis not present

## 2020-05-01 DIAGNOSIS — Z794 Long term (current) use of insulin: Secondary | ICD-10-CM | POA: Diagnosis not present

## 2020-05-01 DIAGNOSIS — Z79899 Other long term (current) drug therapy: Secondary | ICD-10-CM | POA: Insufficient documentation

## 2020-05-01 DIAGNOSIS — E1122 Type 2 diabetes mellitus with diabetic chronic kidney disease: Secondary | ICD-10-CM | POA: Insufficient documentation

## 2020-05-01 DIAGNOSIS — N182 Chronic kidney disease, stage 2 (mild): Secondary | ICD-10-CM | POA: Diagnosis not present

## 2020-05-01 DIAGNOSIS — Z7982 Long term (current) use of aspirin: Secondary | ICD-10-CM | POA: Diagnosis not present

## 2020-05-01 DIAGNOSIS — Z8371 Family history of colonic polyps: Secondary | ICD-10-CM | POA: Diagnosis not present

## 2020-05-01 DIAGNOSIS — Z8249 Family history of ischemic heart disease and other diseases of the circulatory system: Secondary | ICD-10-CM | POA: Insufficient documentation

## 2020-05-01 DIAGNOSIS — I129 Hypertensive chronic kidney disease with stage 1 through stage 4 chronic kidney disease, or unspecified chronic kidney disease: Secondary | ICD-10-CM | POA: Diagnosis not present

## 2020-05-01 HISTORY — PX: TRIGGER FINGER RELEASE: SHX641

## 2020-05-01 HISTORY — DX: Malignant (primary) neoplasm, unspecified: C80.1

## 2020-05-01 HISTORY — PX: CARPAL TUNNEL RELEASE: SHX101

## 2020-05-01 HISTORY — DX: Unspecified osteoarthritis, unspecified site: M19.90

## 2020-05-01 LAB — GLUCOSE, CAPILLARY
Glucose-Capillary: 206 mg/dL — ABNORMAL HIGH (ref 70–99)
Glucose-Capillary: 194 mg/dL — ABNORMAL HIGH (ref 70–99)

## 2020-05-01 SURGERY — RELEASE, CARPAL TUNNEL, ENDOSCOPIC
Anesthesia: General | Site: Wrist | Laterality: Right

## 2020-05-01 MED ORDER — CHLORHEXIDINE GLUCONATE 0.12 % MT SOLN
OROMUCOSAL | Status: AC
  Administered 2020-05-01: 15 mL via OROMUCOSAL
  Filled 2020-05-01: qty 15

## 2020-05-01 MED ORDER — PROPOFOL 10 MG/ML IV BOLUS
INTRAVENOUS | Status: AC
  Filled 2020-05-01: qty 40

## 2020-05-01 MED ORDER — LIDOCAINE HCL (PF) 2 % IJ SOLN
INTRAMUSCULAR | Status: AC
  Filled 2020-05-01: qty 5

## 2020-05-01 MED ORDER — ONDANSETRON HCL 4 MG/2ML IJ SOLN
INTRAMUSCULAR | Status: AC
  Filled 2020-05-01: qty 2

## 2020-05-01 MED ORDER — CEFAZOLIN SODIUM-DEXTROSE 2-4 GM/100ML-% IV SOLN
2.0000 g | INTRAVENOUS | Status: AC
  Administered 2020-05-01: 2 g via INTRAVENOUS

## 2020-05-01 MED ORDER — DEXAMETHASONE SODIUM PHOSPHATE 10 MG/ML IJ SOLN
INTRAMUSCULAR | Status: AC
  Filled 2020-05-01: qty 1

## 2020-05-01 MED ORDER — ACETAMINOPHEN 10 MG/ML IV SOLN
INTRAVENOUS | Status: DC | PRN
  Administered 2020-05-01: 1000 mg via INTRAVENOUS

## 2020-05-01 MED ORDER — FENTANYL CITRATE (PF) 100 MCG/2ML IJ SOLN
INTRAMUSCULAR | Status: AC
  Filled 2020-05-01: qty 2

## 2020-05-01 MED ORDER — SODIUM CHLORIDE 0.9 % IV SOLN
INTRAVENOUS | Status: DC
  Administered 2020-05-01: 10 mL/h via INTRAVENOUS

## 2020-05-01 MED ORDER — TRAMADOL HCL 50 MG PO TABS: 50.0000 mg | ORAL_TABLET | Freq: Four times a day (QID) | ORAL | 0 refills | Status: DC | PRN

## 2020-05-01 MED ORDER — LIDOCAINE HCL (CARDIAC) PF 100 MG/5ML IV SOSY
PREFILLED_SYRINGE | INTRAVENOUS | Status: DC | PRN
  Administered 2020-05-01: 50 mg via INTRAVENOUS

## 2020-05-01 MED ORDER — PROPOFOL 10 MG/ML IV BOLUS
INTRAVENOUS | Status: DC | PRN
  Administered 2020-05-01: 150 mg via INTRAVENOUS

## 2020-05-01 MED ORDER — CEFAZOLIN SODIUM-DEXTROSE 2-4 GM/100ML-% IV SOLN
INTRAVENOUS | Status: AC
  Filled 2020-05-01: qty 100

## 2020-05-01 MED ORDER — OXYCODONE HCL 5 MG/5ML PO SOLN: 5.0000 mg | Freq: Once | ORAL | Status: DC | PRN

## 2020-05-01 MED ORDER — ORAL CARE MOUTH RINSE: 15.0000 mL | Freq: Once | OROMUCOSAL | Status: AC

## 2020-05-01 MED ORDER — EPHEDRINE 5 MG/ML INJ
INTRAVENOUS | Status: AC
  Filled 2020-05-01: qty 10

## 2020-05-01 MED ORDER — FENTANYL CITRATE (PF) 100 MCG/2ML IJ SOLN: 25.0000 ug | INTRAMUSCULAR | Status: DC | PRN

## 2020-05-01 MED ORDER — EPHEDRINE SULFATE 50 MG/ML IJ SOLN
INTRAMUSCULAR | Status: DC | PRN
  Administered 2020-05-01: 20 mg via INTRAVENOUS

## 2020-05-01 MED ORDER — FENTANYL CITRATE (PF) 100 MCG/2ML IJ SOLN
INTRAMUSCULAR | Status: DC | PRN
  Administered 2020-05-01 (×2): 50 ug via INTRAVENOUS

## 2020-05-01 MED ORDER — FAMOTIDINE 20 MG PO TABS
ORAL_TABLET | ORAL | Status: AC
  Administered 2020-05-01: 20 mg via ORAL
  Filled 2020-05-01: qty 1

## 2020-05-01 MED ORDER — DEXAMETHASONE SODIUM PHOSPHATE 10 MG/ML IJ SOLN
INTRAMUSCULAR | Status: DC | PRN
  Administered 2020-05-01: 10 mg via INTRAVENOUS

## 2020-05-01 MED ORDER — OXYCODONE HCL 5 MG PO TABS: 5.0000 mg | ORAL_TABLET | Freq: Once | ORAL | Status: DC | PRN

## 2020-05-01 MED ORDER — ONDANSETRON HCL 4 MG/2ML IJ SOLN
INTRAMUSCULAR | Status: DC | PRN
  Administered 2020-05-01: 4 mg via INTRAVENOUS

## 2020-05-01 MED ORDER — FAMOTIDINE 20 MG PO TABS: 20.0000 mg | ORAL_TABLET | Freq: Once | ORAL | Status: AC

## 2020-05-01 MED ORDER — ONDANSETRON HCL 4 MG/2ML IJ SOLN: 4.0000 mg | Freq: Once | INTRAMUSCULAR | Status: DC | PRN

## 2020-05-01 MED ORDER — BUPIVACAINE HCL (PF) 0.5 % IJ SOLN
INTRAMUSCULAR | Status: DC | PRN
  Administered 2020-05-01: 15 mL

## 2020-05-01 MED ORDER — ACETAMINOPHEN 10 MG/ML IV SOLN: 1000.0000 mg | Freq: Once | INTRAVENOUS | Status: DC | PRN

## 2020-05-01 MED ORDER — ACETAMINOPHEN 10 MG/ML IV SOLN
INTRAVENOUS | Status: AC
  Filled 2020-05-01: qty 100

## 2020-05-01 MED ORDER — CHLORHEXIDINE GLUCONATE 0.12 % MT SOLN: 15.0000 mL | Freq: Once | OROMUCOSAL | Status: AC

## 2020-05-01 SURGICAL SUPPLY — 36 items
BNDG COHESIVE 1X5 TAN NS LF (GAUZE/BANDAGES/DRESSINGS) ×3 IMPLANT
BNDG COHESIVE 4X5 TAN STRL (GAUZE/BANDAGES/DRESSINGS) ×3 IMPLANT
BNDG CONFORM 2 STRL LF (GAUZE/BANDAGES/DRESSINGS) IMPLANT
BNDG ELASTIC 2X5.8 VLCR STR LF (GAUZE/BANDAGES/DRESSINGS) ×3 IMPLANT
BNDG ESMARK 4X12 TAN STRL LF (GAUZE/BANDAGES/DRESSINGS) ×3 IMPLANT
CANISTER SUCT 1200ML W/VALVE (MISCELLANEOUS) ×3 IMPLANT
CHLORAPREP W/TINT 26 (MISCELLANEOUS) ×3 IMPLANT
CORD BIP STRL DISP 12FT (MISCELLANEOUS) ×3 IMPLANT
COVER WAND RF STERILE (DRAPES) ×3 IMPLANT
CUFF TOURN SGL QUICK 18X4 (TOURNIQUET CUFF) ×3 IMPLANT
DRAPE SURG 17X11 SM STRL (DRAPES) ×6 IMPLANT
ELECT CAUTERY BLADE 6.4 (BLADE) ×3 IMPLANT
FORCEPS JEWEL BIP 4-3/4 STR (INSTRUMENTS) ×3 IMPLANT
GAUZE SPONGE 4X4 12PLY STRL (GAUZE/BANDAGES/DRESSINGS) ×3 IMPLANT
GAUZE XEROFORM 1X8 LF (GAUZE/BANDAGES/DRESSINGS) ×3 IMPLANT
GLOVE BIO SURGEON STRL SZ8 (GLOVE) ×6 IMPLANT
GLOVE INDICATOR 8.0 STRL GRN (GLOVE) ×3 IMPLANT
GOWN STRL REUS W/ TWL LRG LVL3 (GOWN DISPOSABLE) ×2 IMPLANT
GOWN STRL REUS W/ TWL XL LVL3 (GOWN DISPOSABLE) ×2 IMPLANT
GOWN STRL REUS W/TWL LRG LVL3 (GOWN DISPOSABLE) ×1
GOWN STRL REUS W/TWL XL LVL3 (GOWN DISPOSABLE) ×1
KIT CARPAL TUNNEL (MISCELLANEOUS) ×1
KIT ESCP INSRT D SLOT CANN KN (MISCELLANEOUS) ×2 IMPLANT
KIT TURNOVER KIT A (KITS) ×3 IMPLANT
MANIFOLD NEPTUNE II (INSTRUMENTS) ×3 IMPLANT
NEEDLE HYPO 25X1 1.5 SAFETY (NEEDLE) ×3 IMPLANT
NS IRRIG 500ML POUR BTL (IV SOLUTION) ×3 IMPLANT
PACK EXTREMITY ARMC (MISCELLANEOUS) ×3 IMPLANT
SPLINT WRIST LG LT TX990309 (SOFTGOODS) IMPLANT
SPLINT WRIST LG RT TX900304 (SOFTGOODS) ×3 IMPLANT
SPLINT WRIST M LT TX990308 (SOFTGOODS) IMPLANT
SPLINT WRIST M RT TX990303 (SOFTGOODS) IMPLANT
SPLINT WRIST XL LT TX990310 (SOFTGOODS) IMPLANT
SPLINT WRIST XL RT TX990305 (SOFTGOODS) IMPLANT
STOCKINETTE IMPERVIOUS 9X36 MD (GAUZE/BANDAGES/DRESSINGS) ×3 IMPLANT
SUT PROLENE 4 0 PS 2 18 (SUTURE) ×3 IMPLANT

## 2020-05-01 NOTE — Anesthesia Procedure Notes (Signed)
Procedure Name: LMA Insertion °Performed by: Skyla Champagne R, CRNA °Pre-anesthesia Checklist: Patient identified, Emergency Drugs available, Suction available and Patient being monitored °Patient Re-evaluated:Patient Re-evaluated prior to induction °Oxygen Delivery Method: Circle system utilized °Preoxygenation: Pre-oxygenation with 100% oxygen °Induction Type: IV induction °LMA: LMA inserted °LMA Size: 4.0 °Tube type: Oral °Number of attempts: 1 °Placement Confirmation: positive ETCO2 and breath sounds checked- equal and bilateral °Dental Injury: Teeth and Oropharynx as per pre-operative assessment  ° ° ° ° ° ° °

## 2020-05-01 NOTE — Transfer of Care (Signed)
Immediate Anesthesia Transfer of Care Note  Patient: Morgan Nicholson  Procedure(s) Performed: CARPAL TUNNEL RELEASE ENDOSCOPIC (Right Wrist) RELEASE TRIGGER FINGER/A-1 PULLEY (Right Thumb)  Patient Location: PACU  Anesthesia Type:General  Level of Consciousness: drowsy  Airway & Oxygen Therapy: Patient Spontanous Breathing and Patient connected to face mask oxygen  Post-op Assessment: Report given to RN and Post -op Vital signs reviewed and stable  Post vital signs: Reviewed and stable  Last Vitals:  Vitals Value Taken Time  BP 138/72 05/01/20 0830  Temp    Pulse 68 05/01/20 0831  Resp 28 05/01/20 0831  SpO2 95 % 05/01/20 0831  Vitals shown include unvalidated device data.  Last Pain:  Vitals:   05/01/20 0623  TempSrc: Tympanic  PainSc: 2       Patients Stated Pain Goal: 1 (67/54/49 2010)  Complications: No complications documented.

## 2020-05-01 NOTE — H&P (Signed)
History of Present Illness:  Morgan Nicholson is a 75 y.o. female who presents for evaluation and treatment of her right hand pain and paresthesias, as well as for recurrent painful catching of her right thumb. The patient notes that the symptoms of paresthesias to her right hand have been present for about 2-3 years and developed without any specific cause or injury. She discussed them with her primary care provider who diagnosed her with carpal tunnel syndrome and advised her to wear a Velcro splint. The patient is wearing her splint fairly regularly at night, as well as occasionally during the day, but notes little improvement in her symptoms. She was ready to consider more aggressive treatment options, but then COVID struck associated with trying to deal with her symptoms during this time. She notes paresthesias to the index, long, and ring fingers more so than the thumb. Her symptoms are aggravated with any grasping or repetitive activities such as driving or holding a magazine, newspaper, or tablet. She also has pain and paresthesias at night, frequently awakening her from sleep.  The patient also notes a history of painful catching of her right thumb. She was diagnosed with a right trigger thumb by Rachelle Hora, PA-C, about 9 months ago. He treated her with a steroid injection which provided relief of her symptoms for only a few weeks before her symptoms recurred. She notes that her symptoms are aggravated by grasping and repetitive activities. She denies any injury to the thumb. She is frustrated by the symptoms and is ready to consider more aggressive treatment options.  Current Outpatient Medications: . amLODIPine (NORVASC) 5 MG tablet TAKE 1 TABLET EVERY DAY 90 tablet 3  . aspirin 81 MG EC tablet Take 81 mg by mouth once daily.  Marland Kitchen atorvastatin (LIPITOR) 80 MG tablet TAKE 1 TABLET EVERY DAY 90 tablet 1  . BD INSULIN SYRINGE ULT-FINE II 1 mL 31 gauge x 5/16 syringe USE 2 (TWO) TIMES DAILY. 180 each 3   . blood glucose diagnostic (ACCU-CHEK AVIVA PLUS TEST STRP) test strip 2 (two) times daily Use as instructed. 200 strip 3  . blood glucose diagnostic (GLUCOSE BLOOD) test strip 1 each (1 strip total) 3 (three) times daily Use as instructed. 300 each 3  . blood glucose meter kit as directed 1 each 0  . calcium carbonate-vitamin D3 (OS-CAL 500+D) 500 mg(1,249m) -200 unit tablet Take 1 tablet by mouth 2 (two) times daily with meals.  . carvediloL (COREG) 12.5 MG tablet TAKE 1 TABLET TWICE DAILY WITH MEALS 180 tablet 1  . fluocinonide (LIDEX) 0.05 % external solution APPLY TO AFFECTED SCALP TWICE DAILY AS NEEDED  . glimepiride (AMARYL) 2 MG tablet TAKE 1 TABLET (2 MG TOTAL) BY MOUTH DAILY WITH BREAKFAST 90 tablet 1  . ketoconazole (NIZORAL) 2 % shampoo APPLY TO SCALP. LATHER AND LEAVE ON FOR 5 MINUTES BEFORE RINSING. USE EVERY OTHER DAY.  .Marland Kitchenlancets Use 1 each 3 (three) times daily Use as instructed. 420 each 1  . lisinopriL (ZESTRIL) 40 MG tablet Take 1 tablet (40 mg total) by mouth once daily 90 tablet 3  . metFORMIN (GLUCOPHAGE) 1000 MG tablet Take 1 tablet (1,000 mg total) by mouth 2 (two) times daily with meals 180 tablet 3  . NOVOLIN 70/30 U-100 INSULIN 100 unit/mL (70-30) injection INJECT 24 UNITS SUBCUTANEOUSLY TWICE DAILY BEFORE MEALS 50 mL 1   Allergies: No Known Allergies  Past Medical History:  . Cataract cortical, senile 2012  . CKD (chronic kidney disease), stage  II  . Diabetes mellitus type 2, uncomplicated (CMS-HCC)  . Encounter for blood transfusion 1965 (Child birth)  . History of colonic polyps (hyperplastic)  . Hyperlipidemia  . Hypertension  . Osteopenia  . Type 2 diabetes mellitus with stage 2 chronic kidney disease (CMS-HCC)  . Vertigo   Past Surgical History:  . BTL  . COLONOSCOPY 08/07/2008, 04/11/2003  FH Colon Polyps (Mother)  . COLONOSCOPY 08/29/2013  Adenomatous Polyps, FH Colon Polyps (Mother): [facility]BF 08/2016; Recall Ltr mailed 07/20/2016 (dw)  . COLONOSCOPY  02/23/2017  Adenomatous Polyp, FH Colon Polyps (Mother): [facility]BF 02/2022  . TUBAL LIGATION 1976   Family History:  . Colon polyps Mother  . High blood pressure (Hypertension) Mother  . Diabetes type II Mother  . Diabetes Daughter   Social History:   Socioeconomic History:  Marland Kitchen Marital status: Widowed  Spouse name: Not on file  . Number of children: Not on file  . Years of education: Not on file  . Highest education level: Not on file  Occupational History  . Not on file  Tobacco Use  . Smoking status: Never Smoker  . Smokeless tobacco: Never Used  Vaping Use  . Vaping Use: Never used  Substance and Sexual Activity  . Alcohol use: No  . Drug use: No  . Sexual activity: Never  Other Topics Concern  . Not on file  Social History Narrative  . Not on file   Social Determinants of Health:   Financial Resource Strain: Not on file  Food Insecurity: Not on file  Transportation Needs: Not on file   Review of Systems:  A comprehensive 14 point ROS was performed, reviewed, and the pertinent orthopaedic findings are documented in the HPI.  Physical Exam: Vitals:  04/21/20 0943  BP: 148/80  Weight: 81.3 kg (179 lb 3.2 oz)  Height: 165.1 cm (_0 )  PainSc: 7  PainLoc: Hand   General/Constitutional: The patient appears to be well-nourished, well-developed, and in no acute distress. Neuro/Psych: Normal mood and affect, oriented to person, place and time. Eyes: Non-icteric. Pupils are equal, round, and reactive to light, and exhibit synchronous movement. ENT: Unremarkable. Lymphatic: No palpable adenopathy. Respiratory: Lungs clear to auscultation, Normal chest excursion, No wheezes and Non-labored breathing Cardiovascular: Regular rate and rhythm. No murmurs. and No edema, swelling or tenderness, except as noted in detailed exam. Integumentary: No impressive skin lesions present, except as noted in detailed exam. Musculoskeletal: Unremarkable, except as noted in detailed  exam.  Right wrist hand exam: Skin inspection of the right wrist and hand is unremarkable. No swelling, erythema, ecchymosis, abrasions, or other skin abnormalities are identified. There is no tenderness to palpation over the dorsal or palmar aspect of the wrist. She does have mild tenderness to palpation over the volar aspect of the thumb MCP joint. A small mass is palpable in this area. She exhibits essentially full range of motion of the wrist without any pain or catching. She also is able to actively flex and extend all digits fully, although thumb motion is slightly limited due to pain and apprehension. She does not exhibit an active actual triggering of her thumb today, but does have apprehension when she tries to flex her thumb. She is neurovascular intact to all digits. She has a positive Phalen's test and an equivocally positive Tinel's over the carpal tunnel on the right.  X-rays/MRI/Lab data:  AP, lateral, and oblique views of the right hand are obtained. These films demonstrate moderate degenerative changes of the right thumb CMC  and IP joints. The thumb MCP joint is well-maintained. Some degenerative changes of the index DIP joint also are noted. No lytic lesions or fractures are identified.  Assessment: 1. Carpal tunnel syndrome, right.  2. Trigger finger of right thumb.   Plan: The treatment options were discussed with the patient. In addition, patient educational materials were provided regarding the diagnosis and treatment options. The patient is quite frustrated by his symptoms and functional limitations, and is ready to consider more aggressive treatment options. Therefore, I have recommended a surgical procedure, specifically an endoscopic right carpal tunnel release with release of her right trigger thumb. The procedure was discussed with the patient, as were the potential risks (including bleeding, infection, nerve and/or blood vessel injury, persistent or recurrent  pain/paresthesias, recurrent catching of her thumb, weakness of grip, need for further surgery, blood clots, strokes, heart attacks and/or arhythmias, pneumonia, etc.) and benefits. The patient states his/her understanding and wishes to proceed. All of the patient's questions and concerns were answered. She can call any time with further concerns. She will follow up post-surgery, routine.   H&P reviewed and patient re-examined. No changes.

## 2020-05-01 NOTE — Anesthesia Preprocedure Evaluation (Signed)
Anesthesia Evaluation  Patient identified by MRN, date of birth, ID band Patient awake    Reviewed: Allergy & Precautions, NPO status , Patient's Chart, lab work & pertinent test results  History of Anesthesia Complications Negative for: history of anesthetic complications  Airway Mallampati: III  TM Distance: >3 FB Neck ROM: Full    Dental  (+) Upper Dentures   Pulmonary neg pulmonary ROS, neg sleep apnea, neg COPD, Patient abstained from smoking.Not current smoker,    Pulmonary exam normal breath sounds clear to auscultation       Cardiovascular Exercise Tolerance: Good METShypertension, (-) CAD and (-) Past MI (-) dysrhythmias  Rhythm:Regular Rate:Normal - Systolic murmurs    Neuro/Psych negative neurological ROS  negative psych ROS   GI/Hepatic neg GERD  ,(+)     (-) substance abuse  ,   Endo/Other  diabetes  Renal/GU CRFRenal disease     Musculoskeletal   Abdominal   Peds  Hematology   Anesthesia Other Findings Past Medical History: No date: 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 No date: Cancer ([place])     Comment:  skin cancer No date: Chronic kidney disease     Comment:  stage 2 No date: Diabetes mellitus without complication (HCC) No date: High cholesterol No date: Hypertension No date: Osteopenia  Reproductive/Obstetrics                             Anesthesia Physical Anesthesia Plan  ASA: II  Anesthesia Plan: General   Post-op Pain Management:    Induction: Intravenous  PONV Risk Score and Plan: 3 and Ondansetron, Dexamethasone and Treatment may vary due to age or medical condition  Airway Management Planned: LMA  Additional Equipment: None  Intra-op Plan:   Post-operative Plan: Extubation in OR  Informed Consent: I have reviewed the patients History and Physical, chart, labs and discussed the procedure including the risks, benefits and alternatives for the  proposed anesthesia with the patient or authorized representative who has indicated his/her understanding and acceptance.     Dental advisory given  Plan Discussed with: CRNA and Surgeon  Anesthesia Plan Comments: (Discussed risks of anesthesia with patient, including PONV, sore throat, lip/dental damage. Rare risks discussed as well, such as cardiorespiratory and neurological sequelae. Patient understands.)        Anesthesia Quick Evaluation

## 2020-05-01 NOTE — Discharge Instructions (Addendum)
Orthopedic discharge instructions: Keep dressing dry and intact. Keep hand elevated above heart level. May shower after dressing removed on postop day 4 (Monday). Cover sutures with Band-Aids after drying off, then re-apply Velcro wrist splint. Apply ice to affected area frequently. Take ibuprofen 600-800 mg TID with meals for 7-10 days, then as necessary. Take ES Tylenol or pain medication as prescribed when needed.  Return for follow-up in 10-14 days or as scheduled.   AMBULATORY SURGERY  DISCHARGE INSTRUCTIONS   1) The drugs that you were given will stay in your system until tomorrow so for the next 24 hours you should not:  A) Drive an automobile B) Make any legal decisions C) Drink any alcoholic beverage   2) You may resume regular meals tomorrow.  Today it is better to start with liquids and gradually work up to solid foods.  You may eat anything you prefer, but it is better to start with liquids, then soup and crackers, and gradually work up to solid foods.   3) Please notify your doctor immediately if you have any unusual bleeding, trouble breathing, redness and pain at the surgery site, drainage, fever, or pain not relieved by medication.  4) Your post-operative visit with Dr.                                     is: Date:                        Time:    Please call to schedule your post-operative visit.  5) Additional Instructions:

## 2020-05-01 NOTE — Anesthesia Postprocedure Evaluation (Signed)
Anesthesia Post Note  Patient: Morgan Nicholson  Procedure(s) Performed: CARPAL TUNNEL RELEASE ENDOSCOPIC (Right Wrist) RELEASE TRIGGER FINGER/A-1 PULLEY (Right Thumb)  Patient location during evaluation: PACU Anesthesia Type: General Level of consciousness: awake and alert Pain management: pain level controlled Vital Signs Assessment: post-procedure vital signs reviewed and stable Respiratory status: spontaneous breathing, nonlabored ventilation, respiratory function stable and patient connected to nasal cannula oxygen Cardiovascular status: blood pressure returned to baseline and stable Postop Assessment: no apparent nausea or vomiting Anesthetic complications: no   No complications documented.   Last Vitals:  Vitals:   05/01/20 0844 05/01/20 0845  BP: 127/67   Pulse: 64   Resp: 16   Temp:  (!) 36.3 C  SpO2: 96%     Last Pain:  Vitals:   05/01/20 0845  TempSrc:   PainSc: 0-No pain                 Arita Miss

## 2020-05-01 NOTE — Op Note (Signed)
05/01/2020  8:29 AM  Patient:   Morgan Nicholson  Pre-Op Diagnosis:   1.  Right carpal tunnel syndrome.  2.  Right trigger thumb.  Post-Op Diagnosis:   Same  Procedure:   1.  Endoscopic right carpal tunnel release.  2.  Release right trigger thumb.  Surgeon:   Pascal Lux, MD  Anesthesia:   General LMA  Findings:   As above.  Complications:   None  EBL:   0 cc  Fluids:   1000 cc crystalloid  TT:   24 minutes at 250 mmHg  Drains:   None  Closure:   4-0 Prolene interrupted sutures  Brief Clinical Note:   The patient is a 75 year old female with a history of progressively worsening pain and paresthesias to her right hand. Her symptoms have persisted despite medications, activity modification, splinting, etc. Her history and examination consistent with carpal tunnel syndrome. The patient presents at this time for an endoscopic right carpal tunnel release.   The patient also notes a long history of recurrent painful catching of her right thumb. The symptoms have persisted despite medications, activity modification, etc. Her history and examination are consistent with a right trigger thumb. She presents at this time for release of the right trigger thumb as well.  Procedure:   The patient was brought into the operating room and lain in the supine position. After adequate general laryngeal mask anesthesia was obtained, the right hand and upper extremity were prepped with ChloraPrep solution before being draped sterilely. Preoperative antibiotics were administered. A timeout was performed to verify the appropriate surgical site before the limb was exsanguinated with an Esmarch and the tourniquet inflated to 250 mmHg.   An approximately 1.5-2 cm incision was made over the volar wrist flexion crease, centered over the palmaris longus tendon. The incision was carried down through the subcutaneous tissues with care taken to identify and protect any neurovascular structures. The distal forearm  fascia was penetrated just proximal to the transverse carpal ligament. The soft tissues were released off the superficial and deep surfaces of the distal forearm fascia and this was released proximally for 3-4 cm under direct visualization.  Attention was directed distally. The Soil scientist was passed beneath the transverse carpal ligament along the ulnar aspect of the carpal tunnel and used to release any adhesions as well as to remove any adherent synovial tissue before first the smaller then the larger of the two dilators were passed beneath the transverse carpal ligament along the ulnar margin of the carpal tunnel. The slotted cannula was introduced and the endoscope was placed into the slotted cannula and the undersurface of the transverse carpal ligament visualized. The distal margin of the transverse carpal ligament was marked by placing a 25-gauge needle percutaneously at [place] cardinal point so that it entered the distal portion of the slotted cannula. Under endoscopic visualization, the transverse carpal ligament was released from proximal to distal using the end-cutting blade. A second pass was performed to ensure complete release of the ligament. The adequacy of release was verified both endoscopically and by palpation using the freer elevator.  Next, attention was directed to the right thumb. An approximately 1.5-2.0 cm incision was made over the volar aspect of the right thumb at the level of the metacarpal head centered over the flexor sheath. The incision was carried down through the subcutaneous tissues with care taken to identify and protect the digital neurovascular structures. The flexor sheath was entered just proximal to the A1 pulley.  The sheath was released proximally for several centimeters under direct visualization. Distally, a clamp was placed beneath the A1 pulley and used to release any adhesions. The clamp was repositioned so that one jaw was superficial to and the other jaw  deep to the A1 pulley. The A1 pulley was incised on either side of the clamp to remove a 2 mm strip of tissue. Metzenbaum scissors were used to ensure complete release of the A1 pulley more distally. The underlying tendons were carefully inspected and found to be intact.  Each wound was irrigated thoroughly with sterile saline solution before being closed using 4-0 Prolene interrupted sutures. A total of 15 cc of 0.5% plain Sensorcaine was injected in and around the two incisions before a sterile bulky dressing was applied to the wounds. The patient was placed into a volar wrist splint before being awakened, extubated, and returned to the recovery room in satisfactory condition after tolerating the procedure well.

## 2020-05-02 ENCOUNTER — Encounter: Payer: Self-pay | Admitting: Surgery

## 2020-07-02 DIAGNOSIS — E1169 Type 2 diabetes mellitus with other specified complication: Secondary | ICD-10-CM | POA: Diagnosis not present

## 2020-07-02 DIAGNOSIS — E1122 Type 2 diabetes mellitus with diabetic chronic kidney disease: Secondary | ICD-10-CM | POA: Diagnosis not present

## 2020-07-02 DIAGNOSIS — N182 Chronic kidney disease, stage 2 (mild): Secondary | ICD-10-CM | POA: Diagnosis not present

## 2020-07-02 DIAGNOSIS — I10 Essential (primary) hypertension: Secondary | ICD-10-CM | POA: Diagnosis not present

## 2020-07-02 DIAGNOSIS — Z794 Long term (current) use of insulin: Secondary | ICD-10-CM | POA: Diagnosis not present

## 2020-07-02 DIAGNOSIS — E785 Hyperlipidemia, unspecified: Secondary | ICD-10-CM | POA: Diagnosis not present

## 2020-07-09 DIAGNOSIS — Z794 Long term (current) use of insulin: Secondary | ICD-10-CM | POA: Diagnosis not present

## 2020-07-09 DIAGNOSIS — E785 Hyperlipidemia, unspecified: Secondary | ICD-10-CM | POA: Diagnosis not present

## 2020-07-09 DIAGNOSIS — E1169 Type 2 diabetes mellitus with other specified complication: Secondary | ICD-10-CM | POA: Diagnosis not present

## 2020-07-09 DIAGNOSIS — E1122 Type 2 diabetes mellitus with diabetic chronic kidney disease: Secondary | ICD-10-CM | POA: Diagnosis not present

## 2020-07-09 DIAGNOSIS — Z0000 Encounter for general adult medical examination without abnormal findings: Secondary | ICD-10-CM | POA: Diagnosis not present

## 2020-07-09 DIAGNOSIS — I10 Essential (primary) hypertension: Secondary | ICD-10-CM | POA: Diagnosis not present

## 2020-07-09 DIAGNOSIS — N182 Chronic kidney disease, stage 2 (mild): Secondary | ICD-10-CM | POA: Diagnosis not present

## 2020-10-29 DIAGNOSIS — E785 Hyperlipidemia, unspecified: Secondary | ICD-10-CM | POA: Diagnosis not present

## 2020-10-29 DIAGNOSIS — E1122 Type 2 diabetes mellitus with diabetic chronic kidney disease: Secondary | ICD-10-CM | POA: Diagnosis not present

## 2020-10-29 DIAGNOSIS — I10 Essential (primary) hypertension: Secondary | ICD-10-CM | POA: Diagnosis not present

## 2020-10-29 DIAGNOSIS — N182 Chronic kidney disease, stage 2 (mild): Secondary | ICD-10-CM | POA: Diagnosis not present

## 2020-10-29 DIAGNOSIS — Z794 Long term (current) use of insulin: Secondary | ICD-10-CM | POA: Diagnosis not present

## 2020-10-29 DIAGNOSIS — E1169 Type 2 diabetes mellitus with other specified complication: Secondary | ICD-10-CM | POA: Diagnosis not present

## 2020-11-05 DIAGNOSIS — Z794 Long term (current) use of insulin: Secondary | ICD-10-CM | POA: Diagnosis not present

## 2020-11-05 DIAGNOSIS — N182 Chronic kidney disease, stage 2 (mild): Secondary | ICD-10-CM | POA: Diagnosis not present

## 2020-11-05 DIAGNOSIS — E1169 Type 2 diabetes mellitus with other specified complication: Secondary | ICD-10-CM | POA: Diagnosis not present

## 2020-11-05 DIAGNOSIS — I129 Hypertensive chronic kidney disease with stage 1 through stage 4 chronic kidney disease, or unspecified chronic kidney disease: Secondary | ICD-10-CM | POA: Diagnosis not present

## 2020-11-05 DIAGNOSIS — E1122 Type 2 diabetes mellitus with diabetic chronic kidney disease: Secondary | ICD-10-CM | POA: Diagnosis not present

## 2020-11-05 DIAGNOSIS — E785 Hyperlipidemia, unspecified: Secondary | ICD-10-CM | POA: Diagnosis not present

## 2020-12-08 ENCOUNTER — Other Ambulatory Visit: Payer: Self-pay | Admitting: Internal Medicine

## 2020-12-08 DIAGNOSIS — Z1231 Encounter for screening mammogram for malignant neoplasm of breast: Secondary | ICD-10-CM

## 2020-12-29 DIAGNOSIS — Z23 Encounter for immunization: Secondary | ICD-10-CM | POA: Diagnosis not present

## 2021-01-09 ENCOUNTER — Ambulatory Visit
Admission: RE | Admit: 2021-01-09 | Discharge: 2021-01-09 | Disposition: A | Discharge status: Home / Self Care | Payer: Medicare HMO | Source: Ambulatory Visit | Attending: Internal Medicine | Admitting: Internal Medicine

## 2021-01-09 ENCOUNTER — Other Ambulatory Visit: Payer: Self-pay

## 2021-01-09 DIAGNOSIS — Z1231 Encounter for screening mammogram for malignant neoplasm of breast: Secondary | ICD-10-CM | POA: Diagnosis not present

## 2021-01-21 DIAGNOSIS — E119 Type 2 diabetes mellitus without complications: Secondary | ICD-10-CM | POA: Diagnosis not present

## 2021-01-21 DIAGNOSIS — H52223 Regular astigmatism, bilateral: Secondary | ICD-10-CM | POA: Diagnosis not present

## 2021-01-21 DIAGNOSIS — Z7984 Long term (current) use of oral hypoglycemic drugs: Secondary | ICD-10-CM | POA: Diagnosis not present

## 2021-01-21 DIAGNOSIS — H2513 Age-related nuclear cataract, bilateral: Secondary | ICD-10-CM | POA: Diagnosis not present

## 2021-01-21 DIAGNOSIS — H524 Presbyopia: Secondary | ICD-10-CM | POA: Diagnosis not present

## 2021-01-21 DIAGNOSIS — H5203 Hypermetropia, bilateral: Secondary | ICD-10-CM | POA: Diagnosis not present

## 2021-01-24 ENCOUNTER — Emergency Department: Payer: Medicare HMO

## 2021-01-24 ENCOUNTER — Inpatient Hospital Stay
Admission: EM | Admit: 2021-01-24 | Discharge: 2021-01-27 | DRG: 065 | Disposition: A | Discharge status: Home Health | Payer: Medicare HMO | Attending: Internal Medicine | Admitting: Internal Medicine

## 2021-01-24 ENCOUNTER — Other Ambulatory Visit: Payer: Self-pay

## 2021-01-24 ENCOUNTER — Encounter: Payer: Self-pay | Admitting: Intensive Care

## 2021-01-24 DIAGNOSIS — R262 Difficulty in walking, not elsewhere classified: Secondary | ICD-10-CM | POA: Diagnosis not present

## 2021-01-24 DIAGNOSIS — M8580 Other specified disorders of bone density and structure, unspecified site: Secondary | ICD-10-CM | POA: Diagnosis present

## 2021-01-24 DIAGNOSIS — R778 Other specified abnormalities of plasma proteins: Secondary | ICD-10-CM

## 2021-01-24 DIAGNOSIS — I639 Cerebral infarction, unspecified: Secondary | ICD-10-CM | POA: Diagnosis not present

## 2021-01-24 DIAGNOSIS — M47812 Spondylosis without myelopathy or radiculopathy, cervical region: Secondary | ICD-10-CM | POA: Diagnosis not present

## 2021-01-24 DIAGNOSIS — I129 Hypertensive chronic kidney disease with stage 1 through stage 4 chronic kidney disease, or unspecified chronic kidney disease: Secondary | ICD-10-CM | POA: Diagnosis not present

## 2021-01-24 DIAGNOSIS — Z8673 Personal history of transient ischemic attack (TIA), and cerebral infarction without residual deficits: Secondary | ICD-10-CM | POA: Diagnosis not present

## 2021-01-24 DIAGNOSIS — Z7982 Long term (current) use of aspirin: Secondary | ICD-10-CM | POA: Diagnosis not present

## 2021-01-24 DIAGNOSIS — N1831 Chronic kidney disease, stage 3a: Secondary | ICD-10-CM | POA: Diagnosis not present

## 2021-01-24 DIAGNOSIS — R29700 NIHSS score 0: Secondary | ICD-10-CM | POA: Diagnosis present

## 2021-01-24 DIAGNOSIS — R29898 Other symptoms and signs involving the musculoskeletal system: Secondary | ICD-10-CM

## 2021-01-24 DIAGNOSIS — Z7984 Long term (current) use of oral hypoglycemic drugs: Secondary | ICD-10-CM

## 2021-01-24 DIAGNOSIS — E7800 Pure hypercholesterolemia, unspecified: Secondary | ICD-10-CM | POA: Diagnosis not present

## 2021-01-24 DIAGNOSIS — I63233 Cerebral infarction due to unspecified occlusion or stenosis of bilateral carotid arteries: Secondary | ICD-10-CM | POA: Diagnosis not present

## 2021-01-24 DIAGNOSIS — M160 Bilateral primary osteoarthritis of hip: Secondary | ICD-10-CM | POA: Diagnosis present

## 2021-01-24 DIAGNOSIS — Z79899 Other long term (current) drug therapy: Secondary | ICD-10-CM | POA: Diagnosis not present

## 2021-01-24 DIAGNOSIS — Z20822 Contact with and (suspected) exposure to covid-19: Secondary | ICD-10-CM | POA: Diagnosis not present

## 2021-01-24 DIAGNOSIS — Z794 Long term (current) use of insulin: Secondary | ICD-10-CM | POA: Diagnosis not present

## 2021-01-24 DIAGNOSIS — N179 Acute kidney failure, unspecified: Secondary | ICD-10-CM | POA: Diagnosis not present

## 2021-01-24 DIAGNOSIS — I6523 Occlusion and stenosis of bilateral carotid arteries: Secondary | ICD-10-CM | POA: Diagnosis not present

## 2021-01-24 DIAGNOSIS — I672 Cerebral atherosclerosis: Secondary | ICD-10-CM | POA: Diagnosis not present

## 2021-01-24 DIAGNOSIS — I10 Essential (primary) hypertension: Secondary | ICD-10-CM | POA: Diagnosis present

## 2021-01-24 DIAGNOSIS — E1165 Type 2 diabetes mellitus with hyperglycemia: Secondary | ICD-10-CM | POA: Diagnosis not present

## 2021-01-24 DIAGNOSIS — I6389 Other cerebral infarction: Secondary | ICD-10-CM | POA: Diagnosis not present

## 2021-01-24 DIAGNOSIS — I248 Other forms of acute ischemic heart disease: Secondary | ICD-10-CM | POA: Diagnosis not present

## 2021-01-24 DIAGNOSIS — Z85828 Personal history of other malignant neoplasm of skin: Secondary | ICD-10-CM

## 2021-01-24 DIAGNOSIS — I779 Disorder of arteries and arterioles, unspecified: Secondary | ICD-10-CM

## 2021-01-24 DIAGNOSIS — E1122 Type 2 diabetes mellitus with diabetic chronic kidney disease: Secondary | ICD-10-CM | POA: Diagnosis not present

## 2021-01-24 DIAGNOSIS — I6503 Occlusion and stenosis of bilateral vertebral arteries: Secondary | ICD-10-CM | POA: Diagnosis not present

## 2021-01-24 DIAGNOSIS — R531 Weakness: Secondary | ICD-10-CM | POA: Diagnosis not present

## 2021-01-24 HISTORY — DX: Cerebral infarction, unspecified: I63.9

## 2021-01-24 HISTORY — DX: Disorder of arteries and arterioles, unspecified: I77.9

## 2021-01-24 LAB — CBC
HCT: 38.9 % (ref 36.0–46.0)
Hemoglobin: 13.4 g/dL (ref 12.0–15.0)
MCH: 31.1 pg (ref 26.0–34.0)
MCHC: 34.4 g/dL (ref 30.0–36.0)
MCV: 90.3 fL (ref 80.0–100.0)
Platelets: 247 10*3/uL (ref 150–400)
RBC: 4.31 MIL/uL (ref 3.87–5.11)
RDW: 11.9 % (ref 11.5–15.5)
WBC: 10.6 10*3/uL — ABNORMAL HIGH (ref 4.0–10.5)
nRBC: 0 % (ref 0.0–0.2)

## 2021-01-24 LAB — RESP PANEL BY RT-PCR (FLU A&B, COVID) ARPGX2
Influenza A by PCR: NEGATIVE
Influenza B by PCR: NEGATIVE
SARS Coronavirus 2 by RT PCR: NEGATIVE

## 2021-01-24 LAB — BASIC METABOLIC PANEL
Anion gap: 12 (ref 5–15)
BUN: 21 mg/dL (ref 8–23)
CO2: 21 mmol/L — ABNORMAL LOW (ref 22–32)
Calcium: 9.6 mg/dL (ref 8.9–10.3)
Chloride: 102 mmol/L (ref 98–111)
Creatinine, Ser: 1.13 mg/dL — ABNORMAL HIGH (ref 0.44–1.00)
GFR, Estimated: 51 mL/min — ABNORMAL LOW (ref >60)
Glucose, Bld: 292 mg/dL — ABNORMAL HIGH (ref 70–99)
Potassium: 4.2 mmol/L (ref 3.5–5.1)
Sodium: 135 mmol/L (ref 135–145)

## 2021-01-24 LAB — CBG MONITORING, ED: Glucose-Capillary: 248 mg/dL — ABNORMAL HIGH (ref 70–99)

## 2021-01-24 LAB — URINALYSIS, COMPLETE (UACMP) WITH MICROSCOPIC
Bacteria, UA: NONE SEEN
Bilirubin Urine: NEGATIVE
Glucose, UA: >=500 mg/dL — AB
Hgb urine dipstick: NEGATIVE
Ketones, ur: NEGATIVE mg/dL
Leukocytes,Ua: NEGATIVE
Nitrite: NEGATIVE
Protein, ur: NEGATIVE mg/dL
Specific Gravity, Urine: 1.016 (ref 1.005–1.030)
pH: 5 (ref 5.0–8.0)

## 2021-01-24 LAB — TROPONIN I (HIGH SENSITIVITY): Troponin I (High Sensitivity): 41 ng/L — ABNORMAL HIGH (ref <18)

## 2021-01-24 LAB — MAGNESIUM: Magnesium: 1.7 mg/dL (ref 1.7–2.4)

## 2021-01-24 LAB — TSH: TSH: 2.754 u[IU]/mL (ref 0.350–4.500)

## 2021-01-24 MED ORDER — ASPIRIN 81 MG PO CHEW
324.0000 mg | CHEWABLE_TABLET | Freq: Once | ORAL | Status: AC
  Administered 2021-01-25: 324 mg via ORAL
  Filled 2021-01-24: qty 4

## 2021-01-24 MED ORDER — SODIUM CHLORIDE 0.9 % IV BOLUS
1000.0000 mL | Freq: Once | INTRAVENOUS | Status: AC
  Administered 2021-01-24: 1000 mL via INTRAVENOUS

## 2021-01-24 NOTE — ED Notes (Signed)
External catheter placed for patient's comfort.  Patient educated on use.

## 2021-01-24 NOTE — ED Provider Notes (Signed)
[facility] Emergency Department Provider Note   ____________________________________________   Event Date/Time   First MD Initiated Contact with Patient 01/24/21 1911     (approximate)  I have reviewed the triage vital signs and the nursing notes.   HISTORY  Chief Complaint Weakness    HPI Morgan Nicholson is a 75 y.o. female with past medical history of hypertension, hyperlipidemia, diabetes, and CKD who presents to the ED for weakness.  Patient reports that she was feeling fine when she woke up this morning, then went to a football game with her family.  She states that around 2 PM she stood up to try and leave the game when she suddenly felt weak in both of her legs.  She states that weakness affected both of her legs equally but made it very difficult for her to walk.  She has felt very unsteady walking since then, feels like her legs could give out on her at any moment.  She denies any associated vision changes, speech changes, numbness, or weakness in her upper extremities.  She has never had similar symptoms in the past, denies any back pain, saddle anesthesia, or bowel/bladder incontinence.  She has been feeling well recently with no fevers, cough, chest pain, or shortness of breath.  She states that symptoms have persisted since onset, have gotten may be slightly better since then.        Past Medical History:  Diagnosis Date   Arthritis    in hips   Cancer ([place])    skin cancer   Chronic kidney disease    stage 2   Diabetes mellitus without complication (HCC)    High cholesterol    Hypertension    Osteopenia     There are no problems to display for this patient.   Past Surgical History:  Procedure Laterality Date   CARPAL TUNNEL RELEASE Right 05/01/2020   Procedure: CARPAL TUNNEL RELEASE ENDOSCOPIC;  Surgeon: Corky Mull, MD;  Location: [facility] ORS;  Service: Orthopedics;  Laterality: Right;   colonic polyps     COLONOSCOPY WITH PROPOFOL  N/A 02/23/2017   Procedure: COLONOSCOPY WITH PROPOFOL;  Surgeon: Manya Silvas, MD;  Location: [facility] ENDOSCOPY;  Service: Endoscopy;  Laterality: N/A;   skin cancer removal Bilateral    arms   TRIGGER FINGER RELEASE Right 05/01/2020   Procedure: RELEASE TRIGGER FINGER/A-1 PULLEY;  Surgeon: Corky Mull, MD;  Location: [facility] ORS;  Service: Orthopedics;  Laterality: Right;   TUBAL LIGATION      Prior to Admission medications   Medication Sig Start Date End Date Taking? Authorizing Provider  amLODipine (NORVASC) 5 MG tablet Take 5 mg by mouth every evening.    [provider]  aspirin EC 81 MG tablet Take 81 mg by mouth every evening.    [provider]  atorvastatin (LIPITOR) 80 MG tablet Take 80 mg by mouth every evening.    [provider]  Calcium Carb-Cholecalciferol (CALCIUM 600+D3 PO) Take 1 tablet by mouth in the morning and at bedtime.    [provider]  carvedilol (COREG) 12.5 MG tablet Take 12.5 mg by mouth 2 (two) times daily. 04/01/20   [provider]  fluocinonide (LIDEX) 0.05 % external solution Apply 1 application topically daily as needed (scalp dermatitis). 12/13/19   [provider]  glimepiride (AMARYL) 2 MG tablet Take 2 mg by mouth at bedtime.    [provider]  insulin NPH-regular Human (NOVOLIN 70/30) (70-30) 100  UNIT/ML injection Inject 24 Units into the skin 2 (two) times daily with a meal. With breakfast & with supper    [provider]  ketoconazole (NIZORAL) 2 % shampoo Apply 1 application topically every other day. 02/12/20   [provider]  lisinopril (PRINIVIL,ZESTRIL) 40 MG tablet Take 40 mg daily by mouth.    [provider]  metFORMIN (GLUCOPHAGE) 1000 MG tablet Take 1,000 mg by mouth in the morning and at bedtime.    [provider]  traMADol (ULTRAM) 50 MG tablet Take 1 tablet (50 mg total) by mouth every 6 (six) hours as needed. 05/01/20 05/01/21  Poggi, Marshall Cork,  MD    Allergies Patient has no known allergies.  Family History  Problem Relation Age of Onset   Breast cancer Neg Hx     Social History Social History   Tobacco Use   Smoking status: Never   Smokeless tobacco: Never  Vaping Use   Vaping Use: Never used  Substance Use Topics   Alcohol use: No   Drug use: No    Review of Systems  Constitutional: No fever/chills Eyes: No visual changes. ENT: No sore throat. Cardiovascular: Denies chest pain. Respiratory: Denies shortness of breath. Gastrointestinal: No abdominal pain.  No nausea, no vomiting.  No diarrhea.  No constipation. Genitourinary: Negative for dysuria. Musculoskeletal: Negative for back pain. Skin: Negative for rash. Neurological: Negative for headaches, positive for bilateral leg weakness, negative for numbness.  ____________________________________________   PHYSICAL EXAM:  VITAL SIGNS: ED Triage Vitals [01/24/21 1556]  Enc Vitals Group     BP (!) 191/95     Pulse Rate 91     Resp 18     Temp 99.2 F (37.3 C)     Temp Source Oral     SpO2 96 %     Weight 174 lb (78.9 kg)     Height 5\' 5"  (1.651 m)     Head Circumference      Peak Flow      Pain Score 0     Pain Loc      Pain Edu?      Excl. in [place]?     Constitutional: Alert and oriented. Eyes: Conjunctivae are normal. Head: Atraumatic. Nose: No congestion/rhinnorhea. Mouth/Throat: Mucous membranes are moist. Neck: Normal ROM Cardiovascular: Normal rate, regular rhythm. Grossly normal heart sounds.  2+ radial pulses bilaterally. Respiratory: Normal respiratory effort.  No retractions. Lungs CTAB. Gastrointestinal: Soft and nontender. No distention. Genitourinary: deferred Musculoskeletal: No lower extremity tenderness nor edema. Neurologic:  Normal speech and language.  4 out of 5 strength in right lower extremity, 5 out of 5 strength in left lower extremity.  5 out of 5 strength in bilateral upper extremities. Skin:  Skin is warm, dry  and intact. No rash noted. Psychiatric: Mood and affect are normal. Speech and behavior are normal.  ____________________________________________   LABS (all labs ordered are listed, but only abnormal results are displayed)  Labs Reviewed  BASIC METABOLIC PANEL - Abnormal; Notable for the following components:      Result Value   CO2 21 (*)    Glucose, Bld 292 (*)    Creatinine, Ser 1.13 (*)    GFR, Estimated 51 (*)    All other components within normal limits  CBC - Abnormal; Notable for the following components:   WBC 10.6 (*)    All other components within normal limits  URINALYSIS, COMPLETE (UACMP) WITH MICROSCOPIC - Abnormal; Notable for the following components:  Color, Urine STRAW (*)    APPearance CLEAR (*)    Glucose, UA >=500 (*)    All other components within normal limits  CBG MONITORING, ED - Abnormal; Notable for the following components:   Glucose-Capillary 248 (*)    All other components within normal limits  TROPONIN I (HIGH SENSITIVITY) - Abnormal; Notable for the following components:   Troponin I (High Sensitivity) 41 (*)    All other components within normal limits  RESP PANEL BY RT-PCR (FLU A&B, COVID) ARPGX2  MAGNESIUM  TSH  TROPONIN I (HIGH SENSITIVITY)   ____________________________________________  EKG  ED ECG REPORT I, Blake Divine, the attending physician, personally viewed and interpreted this ECG.   Date: 01/24/2021  EKG Time: 16:03  Rate: 83  Rhythm: normal sinus rhythm  Axis: LAD  Intervals:none  ST&T Change: None   PROCEDURES  Procedure(s) performed (including Critical Care):  Procedures   ____________________________________________   INITIAL IMPRESSION / ASSESSMENT AND PLAN / ED COURSE      75 year old female with past medical history of hypertension, hyperlipidemia, diabetes, and CKD who presents to the ED with acute onset weakness in her bilateral lower extremities around 2 PM.  Patient reports weakness in both  of her legs, however it seems more pronounced in her right lower extremity than her left at this time.  We will need to further assess for stroke with CT head, but she is outside the window for tPA and neurologic exam is not consistent with a large vessel occlusion.  Labs thus far are remarkable for mild hyperglycemia, but otherwise reassuring.  EKG shows no evidence of arrhythmia or ischemia.  We will add on troponin, magnesium, and TSH.  We will hydrate with IV fluids and reassess following CT head.  Additional labs are unremarkable, CT head is negative for acute process.  Patient continues to have unsteady gait following IV fluid administration.  MRI brain was performed and is positive for small area of acute ischemia at the left central sulcus, which would explain patient's symptoms.  She was given loading dose of aspirin and we will discussed with hospitalist for admission.      ____________________________________________   FINAL CLINICAL IMPRESSION(S) / ED DIAGNOSES  Final diagnoses:  Cerebrovascular accident (CVA), unspecified mechanism ([place])  Weakness of right lower extremity     ED Discharge Orders     None        Note:  This document was prepared using Dragon voice recognition software and may include unintentional dictation errors.    Blake Divine, MD 01/24/21 2352

## 2021-01-24 NOTE — ED Notes (Signed)
Patient ambulated to commode in room.  Patient noted to be an unsteady gait.  Able to ambulate with stand by assist

## 2021-01-24 NOTE — ED Notes (Signed)
Patient to CT at this time

## 2021-01-24 NOTE — ED Notes (Signed)
Patient provided with snack per request after swallow screen.  Reports she is a diabetic and hasnt had anything to eat since the morning.

## 2021-01-24 NOTE — ED Triage Notes (Signed)
Patient reports today around 2pm her legs started feeling weak/wobbly and unable to ambulate on her own. Patient Denies pain or any other symptoms. Bilateral hand grips strong and equal. A&O x4

## 2021-01-25 ENCOUNTER — Inpatient Hospital Stay (HOSPITAL_COMMUNITY)
Admit: 2021-01-25 | Discharge: 2021-01-25 | Disposition: A | Discharge status: Home / Self Care | Payer: Medicare HMO | Attending: Internal Medicine | Admitting: Internal Medicine

## 2021-01-25 ENCOUNTER — Inpatient Hospital Stay: Payer: Medicare HMO

## 2021-01-25 ENCOUNTER — Encounter: Payer: Self-pay | Admitting: Internal Medicine

## 2021-01-25 DIAGNOSIS — I6389 Other cerebral infarction: Secondary | ICD-10-CM | POA: Diagnosis not present

## 2021-01-25 DIAGNOSIS — E1122 Type 2 diabetes mellitus with diabetic chronic kidney disease: Secondary | ICD-10-CM | POA: Diagnosis present

## 2021-01-25 DIAGNOSIS — E1165 Type 2 diabetes mellitus with hyperglycemia: Secondary | ICD-10-CM

## 2021-01-25 DIAGNOSIS — Z85828 Personal history of other malignant neoplasm of skin: Secondary | ICD-10-CM | POA: Diagnosis not present

## 2021-01-25 DIAGNOSIS — M160 Bilateral primary osteoarthritis of hip: Secondary | ICD-10-CM | POA: Diagnosis present

## 2021-01-25 DIAGNOSIS — N1831 Chronic kidney disease, stage 3a: Secondary | ICD-10-CM

## 2021-01-25 DIAGNOSIS — R29700 NIHSS score 0: Secondary | ICD-10-CM | POA: Diagnosis present

## 2021-01-25 DIAGNOSIS — I129 Hypertensive chronic kidney disease with stage 1 through stage 4 chronic kidney disease, or unspecified chronic kidney disease: Secondary | ICD-10-CM | POA: Diagnosis present

## 2021-01-25 DIAGNOSIS — E7800 Pure hypercholesterolemia, unspecified: Secondary | ICD-10-CM | POA: Diagnosis present

## 2021-01-25 DIAGNOSIS — I639 Cerebral infarction, unspecified: Secondary | ICD-10-CM | POA: Diagnosis not present

## 2021-01-25 DIAGNOSIS — I248 Other forms of acute ischemic heart disease: Secondary | ICD-10-CM | POA: Diagnosis present

## 2021-01-25 DIAGNOSIS — I5189 Other ill-defined heart diseases: Secondary | ICD-10-CM

## 2021-01-25 DIAGNOSIS — Z794 Long term (current) use of insulin: Secondary | ICD-10-CM | POA: Diagnosis not present

## 2021-01-25 DIAGNOSIS — Z20822 Contact with and (suspected) exposure to covid-19: Secondary | ICD-10-CM | POA: Diagnosis present

## 2021-01-25 DIAGNOSIS — M8580 Other specified disorders of bone density and structure, unspecified site: Secondary | ICD-10-CM | POA: Diagnosis present

## 2021-01-25 DIAGNOSIS — N179 Acute kidney failure, unspecified: Secondary | ICD-10-CM | POA: Diagnosis not present

## 2021-01-25 DIAGNOSIS — R7989 Other specified abnormal findings of blood chemistry: Secondary | ICD-10-CM

## 2021-01-25 DIAGNOSIS — Z7984 Long term (current) use of oral hypoglycemic drugs: Secondary | ICD-10-CM | POA: Diagnosis not present

## 2021-01-25 DIAGNOSIS — I6503 Occlusion and stenosis of bilateral vertebral arteries: Secondary | ICD-10-CM

## 2021-01-25 DIAGNOSIS — Z79899 Other long term (current) drug therapy: Secondary | ICD-10-CM | POA: Diagnosis not present

## 2021-01-25 DIAGNOSIS — R778 Other specified abnormalities of plasma proteins: Secondary | ICD-10-CM

## 2021-01-25 DIAGNOSIS — Z7982 Long term (current) use of aspirin: Secondary | ICD-10-CM | POA: Diagnosis not present

## 2021-01-25 HISTORY — DX: Occlusion and stenosis of bilateral vertebral arteries: I65.03

## 2021-01-25 HISTORY — DX: Other ill-defined heart diseases: I51.89

## 2021-01-25 LAB — ECHOCARDIOGRAM COMPLETE
AR max vel: 1.78 cm2
AV Peak grad: 13.8 mmHg
Ao pk vel: 1.86 m/s
Area-P 1/2: 3.83 cm2
Height: 65 in
S' Lateral: 2.67 cm
Weight: 2784 oz

## 2021-01-25 LAB — CBC
HCT: 39.2 % (ref 36.0–46.0)
Hemoglobin: 13.2 g/dL (ref 12.0–15.0)
MCH: 30.3 pg (ref 26.0–34.0)
MCHC: 33.7 g/dL (ref 30.0–36.0)
MCV: 90.1 fL (ref 80.0–100.0)
Platelets: 244 10*3/uL (ref 150–400)
RBC: 4.35 MIL/uL (ref 3.87–5.11)
RDW: 12 % (ref 11.5–15.5)
WBC: 11.3 10*3/uL — ABNORMAL HIGH (ref 4.0–10.5)
nRBC: 0 % (ref 0.0–0.2)

## 2021-01-25 LAB — LIPID PANEL
Cholesterol: 122 mg/dL (ref 0–200)
HDL: 42 mg/dL (ref >40)
LDL Cholesterol: 44 mg/dL (ref 0–99)
Total CHOL/HDL Ratio: 2.9 RATIO
Triglycerides: 181 mg/dL — ABNORMAL HIGH (ref <150)
VLDL: 36 mg/dL (ref 0–40)

## 2021-01-25 LAB — CREATININE, SERUM
Creatinine, Ser: 0.98 mg/dL (ref 0.44–1.00)
GFR, Estimated: >60 mL/min (ref >60)

## 2021-01-25 LAB — CBG MONITORING, ED
Glucose-Capillary: 354 mg/dL — ABNORMAL HIGH (ref 70–99)
Glucose-Capillary: 342 mg/dL — ABNORMAL HIGH (ref 70–99)
Glucose-Capillary: 282 mg/dL — ABNORMAL HIGH (ref 70–99)
Glucose-Capillary: 309 mg/dL — ABNORMAL HIGH (ref 70–99)

## 2021-01-25 LAB — TROPONIN I (HIGH SENSITIVITY)
Troponin I (High Sensitivity): 113 ng/L (ref <18)
Troponin I (High Sensitivity): 142 ng/L (ref <18)

## 2021-01-25 MED ORDER — ACETAMINOPHEN 650 MG RE SUPP
650.0000 mg | RECTAL | Status: DC | PRN
  Filled 2021-01-25: qty 1

## 2021-01-25 MED ORDER — ENOXAPARIN SODIUM 40 MG/0.4ML IJ SOSY
40.0000 mg | PREFILLED_SYRINGE | INTRAMUSCULAR | Status: DC
  Administered 2021-01-25 – 2021-01-27 (×3): 40 mg via SUBCUTANEOUS
  Filled 2021-01-25 (×3): qty 0.4

## 2021-01-25 MED ORDER — INSULIN ASPART 100 UNIT/ML IJ SOLN
0.0000 [IU] | Freq: Every day | INTRAMUSCULAR | Status: DC
  Administered 2021-01-25: 4 [IU] via SUBCUTANEOUS
  Administered 2021-01-26: 3 [IU] via SUBCUTANEOUS
  Filled 2021-01-25 (×2): qty 1

## 2021-01-25 MED ORDER — INSULIN ASPART 100 UNIT/ML IJ SOLN
0.0000 [IU] | Freq: Three times a day (TID) | INTRAMUSCULAR | Status: DC
  Administered 2021-01-25: 8 [IU] via SUBCUTANEOUS
  Administered 2021-01-25: 11 [IU] via SUBCUTANEOUS
  Administered 2021-01-25: 15 [IU] via SUBCUTANEOUS
  Administered 2021-01-26 (×2): 8 [IU] via SUBCUTANEOUS
  Administered 2021-01-26: 15 [IU] via SUBCUTANEOUS
  Administered 2021-01-27 (×2): 8 [IU] via SUBCUTANEOUS
  Administered 2021-01-27: 5 [IU] via SUBCUTANEOUS
  Filled 2021-01-25 (×9): qty 1

## 2021-01-25 MED ORDER — ASPIRIN EC 81 MG PO TBEC
81.0000 mg | DELAYED_RELEASE_TABLET | Freq: Every day | ORAL | Status: DC
  Administered 2021-01-25 – 2021-01-27 (×3): 81 mg via ORAL
  Filled 2021-01-25 (×3): qty 1

## 2021-01-25 MED ORDER — CLOPIDOGREL BISULFATE 75 MG PO TABS
75.0000 mg | ORAL_TABLET | Freq: Every day | ORAL | Status: DC
  Administered 2021-01-25 – 2021-01-27 (×3): 75 mg via ORAL
  Filled 2021-01-25 (×3): qty 1

## 2021-01-25 MED ORDER — SODIUM CHLORIDE 0.9 % IV SOLN: INTRAVENOUS | Status: DC

## 2021-01-25 MED ORDER — ATORVASTATIN CALCIUM 80 MG PO TABS
80.0000 mg | ORAL_TABLET | Freq: Every evening | ORAL | Status: DC
  Administered 2021-01-25 – 2021-01-26 (×2): 80 mg via ORAL
  Filled 2021-01-25: qty 4
  Filled 2021-01-25: qty 1

## 2021-01-25 MED ORDER — STROKE: EARLY STAGES OF RECOVERY BOOK: Freq: Once | Status: DC

## 2021-01-25 MED ORDER — ACETAMINOPHEN 325 MG PO TABS: 650.0000 mg | ORAL_TABLET | ORAL | Status: DC | PRN

## 2021-01-25 MED ORDER — LISINOPRIL 20 MG PO TABS
40.0000 mg | ORAL_TABLET | Freq: Every day | ORAL | Status: DC
  Administered 2021-01-25 – 2021-01-27 (×3): 40 mg via ORAL
  Filled 2021-01-25: qty 2
  Filled 2021-01-25 (×2): qty 4

## 2021-01-25 MED ORDER — CARVEDILOL 12.5 MG PO TABS
12.5000 mg | ORAL_TABLET | Freq: Two times a day (BID) | ORAL | Status: DC
  Administered 2021-01-25 – 2021-01-27 (×5): 12.5 mg via ORAL
  Filled 2021-01-25: qty 2
  Filled 2021-01-25: qty 1
  Filled 2021-01-25: qty 2
  Filled 2021-01-25: qty 1
  Filled 2021-01-25: qty 2

## 2021-01-25 MED ORDER — ACETAMINOPHEN 160 MG/5ML PO SOLN
650.0000 mg | ORAL | Status: DC | PRN
  Filled 2021-01-25: qty 20.3

## 2021-01-25 MED ORDER — IOHEXOL 350 MG/ML SOLN
75.0000 mL | Freq: Once | INTRAVENOUS | Status: AC | PRN
  Administered 2021-01-25: 75 mL via INTRAVENOUS
  Filled 2021-01-25: qty 75

## 2021-01-25 NOTE — Progress Notes (Signed)
PT Cancellation Note  Patient Details Name: REILLY MOLCHAN MRN: [id number] DOB: [birth year]   Cancelled Treatment:    Reason Eval/Treat Not Completed: Medical issues which prohibited therapy: Pt's BG currently 354 and trending up falling outside guidelines for participation with PT services.  Will attempt to see pt at a future date/time as medically appropriate.     Linus Salmons PT, DPT 01/25/21, 9:10 AM

## 2021-01-25 NOTE — ED Notes (Signed)
Pt sleeping, resting comfortably, NAD, chest rise & fall. No needs identified at this time. Bed low & locked. Call light & personal items within reach.

## 2021-01-25 NOTE — Consult Note (Signed)
Neurology Consultation Reason for Consult: Stroke Referring Physician: Amery, S  CC: Leg weakness  History is obtained from: Patient  HPI: Morgan Nicholson is a 75 y.o. female with a history of DM, hypertension, hypercholesterolemia, CKD who presents with leg weakness that started around 2 PM yesterday.  She states that it was quite abrupt and she was having trouble walking and therefore she sought care in the emergency department.  In the ER, she initially complained of bilateral leg weakness and therefore stroke was not initially suspected.  Once an MRI was obtained, however, it was seen that she did have a left parietal stroke.    LKW: 2 PM tpa given?: no, stroke not initially suspected   ROS: A 14 point ROS was performed and is negative except as noted in the HPI.  Past Medical History:  Diagnosis Date   Arthritis    in hips   Cancer ([place])    skin cancer   Chronic kidney disease    stage 2   Diabetes mellitus without complication (HCC)    High cholesterol    Hypertension    Osteopenia      Family History  Problem Relation Age of Onset   Breast cancer Neg Hx      Social History:  reports that she has never smoked. She has never used smokeless tobacco. She reports that she does not drink alcohol and does not use drugs.  Exam: Current vital signs: BP (!) 170/80 (BP Location: Right Arm)   Pulse 84   Temp 98.9 F (37.2 C) (Oral)   Resp 17   Ht 5\' 5"  (1.651 m)   Wt 78.9 kg   SpO2 96%   BMI 28.96 kg/m  Vital signs in last 24 hours: Temp:  [98.9 F (37.2 C)-99.2 F (37.3 C)] 98.9 F (37.2 C) (10/16 0918) Pulse Rate:  [74-100] 84 (10/16 0918) Resp:  [17-18] 17 (10/16 0918) BP: (145-191)/(72-95) 170/80 (10/16 0918) SpO2:  [93 %-98 %] 96 % (10/16 0918) Weight:  [78.9 kg] 78.9 kg (10/15 1556)   Physical Exam  Constitutional: Appears well-developed and well-nourished.  Psych: Affect appropriate to situation Eyes: No scleral injection HENT: No OP obstruction MSK:  no joint deformities.  Cardiovascular: Normal rate and regular rhythm.  Respiratory: Effort normal, non-labored breathing GI: Soft.  No distension. There is no tenderness.  Skin: WDI  Neuro: Mental Status: Patient is awake, alert, oriented to person, place, month, year, and situation. Patient is able to give a clear and coherent history. No signs of aphasia or neglect Cranial Nerves: II: Visual Fields are full. Pupils are equal, round, and reactive to light.   III,IV, VI: EOMI without ptosis or diploplia.  V: Facial sensation is symmetric to temperature VII: Facial movement with ? Mild right NL fold flattening VIII: hearing is intact to voice X: Uvula elevates symmetrically XI: Shoulder shrug is symmetric. XII: tongue is midline without atrophy or fasciculations.  Motor: Tone is normal. Bulk is normal. 5/5 strength was present in bilateral UE with the exception of very mild right triceps weakness. 4/5 RLE, 5/5 on the left Sensory: Sensation is diminished in the high cervical region on the right, C1 and C2 area.   Cerebellar: FNF and HKS are intact bilaterally   I have reviewed labs in epic and the results pertinent to this consultation are: LDL 44   I have reviewed the images obtained:MRI brain - focus of acute sichemia in the left.    Impression: 75 yo F with  risk factors including CKD, HTN, hyperlipidemia, DM presenting with an acute ischemic infarct that could be thrombotic or possibly embolic.Marland Kitchen She will need further evaluation as outlined below. I suspect her sense that her left leg is weak is that she cannot compensate for the right leg weakness as the stroke would not explain this.   Recommendations: 1) ASA 81mg  daily + plavix 75mg  daily x 3 weeks 2) echo 3) Telemetry, would favor prolonged cardiac monitoring at discharge.  4) CT angio head and neck 5) no need for more aggressive lipid control given LDL 6) PT,OT,ST   Roland Rack, MD [facility] [phone]  If 7pm- 7am, please page neurology on call as listed in [place].

## 2021-01-25 NOTE — Progress Notes (Signed)
*  PRELIMINARY RESULTS* Echocardiogram 2D Echocardiogram has been performed.  Morgan Nicholson Morgan Sculley 01/25/2021, 10:46 AM

## 2021-01-25 NOTE — ED Notes (Signed)
Dr. Damita Dunnings notified of critical troponin 113 called from lab. No new verbal orders received.

## 2021-01-25 NOTE — Progress Notes (Signed)
Patient ID: [name], female   DOB: 1945-12-08, 75 y.o.   MRN: [id number] This is a no charge note as patient was admitted this AM.  H&P reviewed patient seen and examined. Morgan Nicholson is a 75 y.o. female with medical history significant for HTN, DM, CKD 3, HLD who presents with acute onset weakness of bilateral lower extremities right greater than left with difficulty ambulating starting at around 2 PM, arriving to the ED at 7 PM.  She denies headache or visual disturbance or weakness numbness or tingling in the arm or face.  She was previously in her usual state of health and denies any recent illness  MRI with small focus of acute ischemia at the base of the left central sulcus.  No hemorrhage or mass-effect.  Chronic microvascular ischemia  No facial droop EOMI UE b/l 5/5 RLE 4/5 LLE 5/5 Otherwise neuro exam unremarkable.  A/p: Neurology's input was appreciated Recommend aspirin 81 mg daily plus Plavix 75 mg x 3 weeks Obtain echo Telemetry for now but will need monitor/Linq as outpatient as this possibly could be thrombotic in nature Obtain CT angio head and neck PT/OT/ST  LDL goal less than 70% Permissive hypertension

## 2021-01-25 NOTE — Progress Notes (Addendum)
OT Cancellation Note  Patient Details Name: ALOIS MINCER MRN: [id number] DOB: 1945/06/29   Cancelled Treatment:    Reason Eval/Treat Not Completed: Medical issues which prohibited therapy. Pt with most recent glucose 354 (time 0832), outside of safe parameters for therapy evaluation at this time.     Addendum: pt's glucose continues to be elevated at 342 (time: 1237). OT will continue to follow and evaluate when medically appropriate.    Fredirick Maudlin, OTR/L [place]

## 2021-01-25 NOTE — H&P (Signed)
History and Physical    Morgan Nicholson ZOX:[id number] DOB: Nov 11, 1945 DOA: 01/24/2021  PCP: Kirk Ruths, MD   Patient coming from: home  I have personally briefly reviewed patient's old medical records in [place]  Chief Complaint: lower extremity weakness yeah Dr. Damita Dunnings  HPI: Morgan Nicholson is a 75 y.o. female with medical history significant for HTN, DM, CKD 3, HLD who presents with acute onset weakness of bilateral lower extremities right greater than left with difficulty ambulating starting at around 2 PM, arriving to the ED at 7 PM.  She denies headache or visual disturbance or weakness numbness or tingling in the arm or face.  She was previously in her usual state of health and denies any recent illness.  Denies cough or shortness of breath, denies chest pain, fever or chills, nausea vomiting abdominal pain or diarrhea.  ED course: On arrival BP 191/95, temp 99.2 with otherwise normal vitals Blood work significant for glucose 292, creatinine 1.13 which is baseline, troponin 41-1 13  EKG, personally viewed and interpreted: Sinus rhythm at 83 with no acute ST-T wave changes  Imaging: MRI with small focus of acute ischemia at the base of the left central sulcus.  No hemorrhage or mass-effect.  Chronic microvascular ischemia  Patient given aspirin 324.  Hospitalist consulted for admission.  Review of Systems: As per HPI otherwise all other systems on review of systems negative.    Past Medical History:  Diagnosis Date   Arthritis    in hips   Cancer ([place])    skin cancer   Chronic kidney disease    stage 2   Diabetes mellitus without complication ([place])    High cholesterol    Hypertension    Osteopenia     Past Surgical History:  Procedure Laterality Date   CARPAL TUNNEL RELEASE Right 05/01/2020   Procedure: CARPAL TUNNEL RELEASE ENDOSCOPIC;  Surgeon: Corky Mull, MD;  Location: [facility] ORS;  Service: Orthopedics;  Laterality: Right;   colonic polyps      COLONOSCOPY WITH PROPOFOL N/A 02/23/2017   Procedure: COLONOSCOPY WITH PROPOFOL;  Surgeon: Manya Silvas, MD;  Location: [facility]COPY;  Service: Endoscopy;  Laterality: N/A;   skin cancer removal Bilateral    arms   TRIGGER FINGER RELEASE Right 05/01/2020   Procedure: RELEASE TRIGGER FINGER/A-1 PULLEY;  Surgeon: Corky Mull, MD;  Location: [facility] ORS;  Service: Orthopedics;  Laterality: Right;   TUBAL LIGATION       reports that she has never smoked. She has never used smokeless tobacco. She reports that she does not drink alcohol and does not use drugs.  No Known Allergies  Family History  Problem Relation Age of Onset   Breast cancer Neg Hx       Prior to Admission medications   Medication Sig Start Date End Date Taking? Authorizing Provider  amLODipine (NORVASC) 5 MG tablet Take 5 mg by mouth every evening.    [provider]  aspirin EC 81 MG tablet Take 81 mg by mouth every evening.    [provider]  atorvastatin (LIPITOR) 80 MG tablet Take 80 mg by mouth every evening.    [provider]  Calcium Carb-Cholecalciferol (CALCIUM 600+D3 PO) Take 1 tablet by mouth in the morning and at bedtime.    [provider]  carvedilol (COREG) 12.5 MG tablet Take 12.5 mg by mouth 2 (two) times daily. 04/01/20   [provider]  fluocinonide (LIDEX) 0.05 % external solution  Apply 1 application topically daily as needed (scalp dermatitis). 12/13/19   [provider]  glimepiride (AMARYL) 2 MG tablet Take 2 mg by mouth at bedtime.    [provider]  insulin NPH-regular Human (NOVOLIN 70/30) (70-30) 100 UNIT/ML injection Inject 24 Units into the skin 2 (two) times daily with a meal. With breakfast & with supper    [provider]  ketoconazole (NIZORAL) 2 % shampoo Apply 1 application topically every other day. 02/12/20   [provider]  lisinopril (PRINIVIL,ZESTRIL) 40 MG tablet Take 40 mg daily by mouth.     [provider]  metFORMIN (GLUCOPHAGE) 1000 MG tablet Take 1,000 mg by mouth in the morning and at bedtime.    [provider]  traMADol (ULTRAM) 50 MG tablet Take 1 tablet (50 mg total) by mouth every 6 (six) hours as needed. 05/01/20 05/01/21  Corky Mull, MD    Physical Exam: Vitals:   01/24/21 1556 01/24/21 2030 01/24/21 2325 01/24/21 2330  BP: (!) 191/95 (!) 171/87 (!) 185/94 (!) 176/92  Pulse: 91 74 100 85  Resp: 18 18    Temp: 99.2 F (37.3 C)     TempSrc: Oral     SpO2: 96% 96% 97% 95%  Weight: 78.9 kg     Height: 5\' 5"  (1.651 m)        Vitals:   01/24/21 1556 01/24/21 2030 01/24/21 2325 01/24/21 2330  BP: (!) 191/95 (!) 171/87 (!) 185/94 (!) 176/92  Pulse: 91 74 100 85  Resp: 18 18    Temp: 99.2 F (37.3 C)     TempSrc: Oral     SpO2: 96% 96% 97% 95%  Weight: 78.9 kg     Height: 5\' 5"  (1.651 m)         Constitutional: Alert and oriented x 3 . Not in any apparent distress HEENT:      Head: Normocephalic and atraumatic.         Eyes: PERLA, EOMI, Conjunctivae are normal. Sclera is non-icteric.       Mouth/Throat: Mucous membranes are moist.       Neck: Supple with no signs of meningismus. Cardiovascular: Regular rate and rhythm. No murmurs, gallops, or rubs. 2+ symmetrical distal pulses are present . No JVD. NoLE edema Respiratory: Respiratory effort normal .Lungs sounds clear bilaterally. No wheezes, crackles, or rhonchi.  Gastrointestinal: Soft, non tender, and non distended with positive bowel sounds.  Genitourinary: No CVA tenderness. Musculoskeletal: Nontender with normal range of motion in all extremities. No cyanosis, or erythema of extremities. Neurologic:  Face is symmetric. Moving all extremities. Slight differential weakness right leg Skin: Skin is warm, dry.  No rash or ulcers Psychiatric: Mood and affect are normal    Labs on Admission: I have personally reviewed following labs and imaging studies  CBC: Recent Labs  Lab  01/24/21 1606  WBC 10.6*  HGB 13.4  HCT 38.9  MCV 90.3  PLT 694   Basic Metabolic Panel: Recent Labs  Lab 01/24/21 1606  NA 135  K 4.2  CL 102  CO2 21*  GLUCOSE 292*  BUN 21  CREATININE 1.13*  CALCIUM 9.6  MG 1.7   GFR: Estimated Creatinine Clearance: 44.7 mL/min (A) (by C-G formula based on SCr of 1.13 mg/dL (H)). Liver Function Tests: No results for input(s): AST, ALT, ALKPHOS, BILITOT, PROT, ALBUMIN in the last 168 hours. No results for input(s): LIPASE, AMYLASE in the last 168 hours. No results for input(s): AMMONIA in  the last 168 hours. Coagulation Profile: No results for input(s): INR, PROTIME in the last 168 hours. Cardiac Enzymes: No results for input(s): CKTOTAL, CKMB, CKMBINDEX, TROPONINI in the last 168 hours. BNP (last 3 results) No results for input(s): PROBNP in the last 8760 hours. HbA1C: No results for input(s): HGBA1C in the last 72 hours. CBG: Recent Labs  Lab 01/24/21 2028  GLUCAP 248*   Lipid Profile: No results for input(s): CHOL, HDL, LDLCALC, TRIG, CHOLHDL, LDLDIRECT in the last 72 hours. Thyroid Function Tests: Recent Labs    01/24/21 1606  TSH 2.754   Anemia Panel: No results for input(s): VITAMINB12, FOLATE, FERRITIN, TIBC, IRON, RETICCTPCT in the last 72 hours. Urine analysis:    Component Value Date/Time   COLORURINE STRAW (A) 01/24/2021 1606   APPEARANCEUR CLEAR (A) 01/24/2021 1606   LABSPEC 1.016 01/24/2021 1606   PHURINE 5.0 01/24/2021 1606   GLUCOSEU >=500 (A) 01/24/2021 1606   HGBUR NEGATIVE 01/24/2021 1606   BILIRUBINUR NEGATIVE 01/24/2021 1606   KETONESUR NEGATIVE 01/24/2021 1606   PROTEINUR NEGATIVE 01/24/2021 1606   NITRITE NEGATIVE 01/24/2021 1606   LEUKOCYTESUR NEGATIVE 01/24/2021 1606    Radiological Exams on Admission: CT Head Wo Contrast  Result Date: 01/24/2021 CLINICAL DATA:  Leg weakness and difficulty ambulating EXAM: CT HEAD WITHOUT CONTRAST TECHNIQUE: Contiguous axial images were obtained from the  base of the skull through the vertex without intravenous contrast. COMPARISON:  None. FINDINGS: Brain: No evidence of acute infarction, hemorrhage, hydrocephalus, extra-axial collection or mass lesion/mass effect. Vascular: No hyperdense vessel or unexpected calcification. Skull: Normal. Negative for fracture or focal lesion. Sinuses/Orbits: No acute finding. Other: None. IMPRESSION: No acute intracranial abnormality noted. Electronically Signed   By: Inez Catalina M.D.   On: 01/24/2021 20:11   MR BRAIN WO CONTRAST  Result Date: 01/24/2021 CLINICAL DATA:  Lower extremity weakness EXAM: MRI HEAD WITHOUT CONTRAST TECHNIQUE: Multiplanar, multiecho pulse sequences of the brain and surrounding structures were obtained without intravenous contrast. COMPARISON:  None. FINDINGS: Brain: There is a focus of abnormal diffusion restriction at the base of left central sulcus. No other diffusion abnormality. No acute or chronic hemorrhage. There is multifocal hyperintense T2-weighted signal within the white matter. Parenchymal volume and CSF spaces are normal. The midline structures are normal. Vascular: Major flow voids are preserved. Skull and upper cervical spine: Normal calvarium and skull base. Visualized upper cervical spine and soft tissues are normal. Sinuses/Orbits:No paranasal sinus fluid levels or advanced mucosal thickening. No mastoid or middle ear effusion. Normal orbits. IMPRESSION: 1. Small focus of acute ischemia at the base of the left central sulcus. No hemorrhage or mass effect. 2. Findings of chronic microvascular ischemia. Electronically Signed   By: Ulyses Jarred M.D.   On: 01/24/2021 23:21     Assessment/Plan    Acute CVA (cerebrovascular accident) ([place]) -MRI with small focus of acute ischemia at the base of the left central sulcus - Continue aspirin and will add plavix for a 30 day overlap, atorvastatin to continue pending neurology eval - Permissive hypertension - Stroke work-up to include  cardiac monitoring, echocardiogram and carotid Doppler - PT OT and speech therapy consult - Neurology consult    Elevated troponin - Troponin 44-113, denies chest pain and EKG nonacute - Likely secondary to acute CVA - Continue to trend to peak - Echocardiogram to evaluate for wall motion abnormality - Consider cardiology consult if it continues to uptrend    Hyperglycemia due to type 2 diabetes mellitus ([place]) - Blood sugar 292 -  Sliding scale insulin to maintain euglycemia    HTN (hypertension), benign - BP elevated above goal at 191/95 on arrival - We will hold antihypertensives for permissive hypertension for 48 hours    Chronic kidney disease, stage 3a ([place]) - Renal function at baseline    DVT prophylaxis: Lovenox  Code Status: full code  Family Communication:  none  Disposition Plan: Back to previous home environment Consults called: none  Status:observation    Athena Masse MD [facility]     01/25/2021, 12:10 AM

## 2021-01-25 NOTE — ED Notes (Signed)
Patient resting quietly at this time.

## 2021-01-26 ENCOUNTER — Other Ambulatory Visit: Payer: Self-pay | Admitting: Physician Assistant

## 2021-01-26 DIAGNOSIS — I639 Cerebral infarction, unspecified: Secondary | ICD-10-CM

## 2021-01-26 LAB — CBC
HCT: 38.5 % (ref 36.0–46.0)
Hemoglobin: 13.5 g/dL (ref 12.0–15.0)
MCH: 31.5 pg (ref 26.0–34.0)
MCHC: 35.1 g/dL (ref 30.0–36.0)
MCV: 89.7 fL (ref 80.0–100.0)
Platelets: 253 10*3/uL (ref 150–400)
RBC: 4.29 MIL/uL (ref 3.87–5.11)
RDW: 11.9 % (ref 11.5–15.5)
WBC: 11.1 10*3/uL — ABNORMAL HIGH (ref 4.0–10.5)
nRBC: 0 % (ref 0.0–0.2)

## 2021-01-26 LAB — GLUCOSE, CAPILLARY
Glucose-Capillary: 294 mg/dL — ABNORMAL HIGH (ref 70–99)
Glucose-Capillary: 261 mg/dL — ABNORMAL HIGH (ref 70–99)

## 2021-01-26 LAB — TROPONIN I (HIGH SENSITIVITY): Troponin I (High Sensitivity): 76 ng/L — ABNORMAL HIGH (ref <18)

## 2021-01-26 LAB — CBG MONITORING, ED
Glucose-Capillary: 290 mg/dL — ABNORMAL HIGH (ref 70–99)
Glucose-Capillary: 423 mg/dL — ABNORMAL HIGH (ref 70–99)
Glucose-Capillary: 421 mg/dL — ABNORMAL HIGH (ref 70–99)

## 2021-01-26 LAB — HEMOGLOBIN A1C
Hgb A1c MFr Bld: 8.7 % — ABNORMAL HIGH (ref 4.8–5.6)
Mean Plasma Glucose: 203 mg/dL

## 2021-01-26 LAB — GLUCOSE, RANDOM: Glucose, Bld: 450 mg/dL — ABNORMAL HIGH (ref 70–99)

## 2021-01-26 MED ORDER — INSULIN ASPART 100 UNIT/ML IJ SOLN
10.0000 [IU] | Freq: Three times a day (TID) | INTRAMUSCULAR | Status: DC
  Administered 2021-01-26 – 2021-01-27 (×3): 10 [IU] via SUBCUTANEOUS
  Filled 2021-01-26 (×3): qty 1

## 2021-01-26 MED ORDER — INSULIN GLARGINE-YFGN 100 UNIT/ML ~~LOC~~ SOLN
10.0000 [IU] | Freq: Every day | SUBCUTANEOUS | Status: DC
  Administered 2021-01-26: 10 [IU] via SUBCUTANEOUS
  Filled 2021-01-26: qty 0.1

## 2021-01-26 MED ORDER — INSULIN ASPART 100 UNIT/ML IJ SOLN
5.0000 [IU] | Freq: Three times a day (TID) | INTRAMUSCULAR | Status: DC
  Administered 2021-01-26: 5 [IU] via SUBCUTANEOUS
  Filled 2021-01-26: qty 1

## 2021-01-26 MED ORDER — INSULIN GLARGINE-YFGN 100 UNIT/ML ~~LOC~~ SOLN
10.0000 [IU] | Freq: Once | SUBCUTANEOUS | Status: AC
  Administered 2021-01-26: 10 [IU] via SUBCUTANEOUS
  Filled 2021-01-26: qty 0.1

## 2021-01-26 MED ORDER — INSULIN GLARGINE-YFGN 100 UNIT/ML ~~LOC~~ SOLN
20.0000 [IU] | Freq: Every day | SUBCUTANEOUS | Status: DC
  Filled 2021-01-26: qty 0.2

## 2021-01-26 NOTE — Evaluation (Signed)
Physical Therapy Evaluation Patient Details Name: Morgan Nicholson MRN: [id number] DOB: [birth year] Today's Date: 01/26/2021  History of Present Illness  Pt is a 75 y.o. female with medical history significant for HTN, DM, CKD 3, HLD who presents with acute onset weakness of bilateral lower extremities (right greater than left) with difficulty ambulating. MRI revealed small focus of acute ischemia at the base of the left central sulcus.   Clinical Impression  Pt was pleasant and motivated to participate during the session and overall performed well during the session. Pt required no physical assistance with any functional task with SpO2, BP, and HR all WNL.  Pt ambulated with a slow cadence but was generally steady with the RW.  During balance assessment and training without an AD the pt did present with balance impairments including SLS time on BLE's of < 1 sec with significant effort needed to prevent LOB during attempts.  Pt was able to maintain stability with feet together with both eyes open as well as with eyes closed.  Pt presented with mild strength impairment to the RLE compared to the left but coordination and sensation were [facility] and equal left/right. Lengthy discussion held with pt regarding recommendation for 24/7 supervision upon initial discharge with pt stating she would contact her three local children to develop a plan for coverage. Pt educated that HHPT/OT could help guide pt and family on recommendations for supervision once home. Pt will benefit from HHPT upon discharge to safely address deficits listed in patient problem list for decreased caregiver assistance and eventual return to PLOF.         Recommendations for follow up therapy are one component of a multi-disciplinary discharge planning process, led by the attending physician.  Recommendations may be updated based on patient status, additional functional criteria and insurance authorization.  Follow Up Recommendations Home  health PT;Supervision/Assistance - 24 hour    Equipment Recommendations  Rolling walker with 5" wheels;3in1 (PT)    Recommendations for Other Services       Precautions / Restrictions Precautions Precautions: Fall Restrictions Weight Bearing Restrictions: No      Mobility  Bed Mobility Overal bed mobility: Modified Independent             General bed mobility comments: Min extra time and effort only    Transfers Overall transfer level: Needs assistance Equipment used: Rolling walker (2 wheeled) Transfers: Sit to/from Stand Sit to Stand: Supervision         General transfer comment: Good eccentric and concentric control and stability  Ambulation/Gait Ambulation/Gait assistance: Supervision Gait Distance (Feet): 125 Feet Assistive device: Rolling walker (2 wheeled) Gait Pattern/deviations: Step-through pattern;Decreased step length - right;Decreased step length - left;Trunk flexed Gait velocity: decreased   General Gait Details: Slow cadence but generally steady without LOB with mod verbal cues for amb closer to the RW with upright posture and to stay within the RW during turns  Stairs            Wheelchair Mobility    Modified Rankin (Stroke Patients Only)       Balance Overall balance assessment: Needs assistance Sitting-balance support: No upper extremity supported;Feet unsupported Sitting balance-Leahy Scale: Good Sitting balance - Comments: Good static and dynamic sitting balance including reaching outside BOS   Standing balance support: No upper extremity supported;Bilateral upper extremity supported;During functional activity   Standing balance comment: Fair to good stability during standing activities with the RW and poor to fair standing balance without UE support  [place] - Eyes Opened: 10 [place] - Eyes Closed: 10   High Level Balance Comments: SLS time < 1 sec on BLE's with pt generally able to make attempts without  physical assist for stability although did require min A during one attempt to prevent LOB             Pertinent Vitals/Pain Pain Assessment: No/denies pain    Home Living Family/patient expects to be discharged to:: Private residence Living Arrangements: Alone Available Help at Discharge: Family;Available PRN/intermittently Type of Home: Mobile home Home Access: Stairs to enter Entrance Stairs-Rails: Left Entrance Stairs-Number of Steps: 3 Home Layout: One level Home Equipment: Walker - 4 wheels      Prior Function Level of Independence: Independent         Comments: Ind amb community distances without an AD, no fall history, walks 1 mile/day for exercise, Ind with ADLs     Hand Dominance   Dominant Hand: Right    Extremity/Trunk Assessment   Upper Extremity Assessment Upper Extremity Assessment: Overall WFL for tasks assessed    Lower Extremity Assessment Lower Extremity Assessment: RLE deficits/detail;LLE deficits/detail RLE Deficits / Details: RLE hip flex and knee ext strength grossly 4/5, knee flex 4-/5 RLE Sensation: WNL RLE Coordination: WNL LLE Deficits / Details: LLE hip flex, knee ext, and knee flex 4+/5 LLE Sensation: WNL LLE Coordination: WNL       Communication   Communication: No difficulties  Cognition Arousal/Alertness: Awake/alert Behavior During Therapy: WFL for tasks assessed/performed Overall Cognitive Status: Within Functional Limits for tasks assessed                                        General Comments      Exercises Other Exercises Other Exercises: Static and dynamic standing balance training with eyes open/closed and feet apart/together   Assessment/Plan    PT Assessment Patient needs continued PT services  PT Problem List Decreased strength;Decreased balance;Decreased activity tolerance;Decreased mobility;Decreased knowledge of use of DME       PT Treatment Interventions DME instruction;Gait  training;Stair training;Functional mobility training;Therapeutic activities;Therapeutic exercise;Balance training;Patient/family education    PT Goals (Current goals can be found in the Care Plan section)  Acute Rehab PT Goals Patient Stated Goal: To get back to walking normally PT Goal Formulation: With patient Time For Goal Achievement: 02/08/21 Potential to Achieve Goals: Good    Frequency 7X/week   Barriers to discharge        Co-evaluation               AM-PAC PT "6 Clicks" Mobility  Outcome Measure Help needed turning from your back to your side while in a flat bed without using bedrails?: None Help needed moving from lying on your back to sitting on the side of a flat bed without using bedrails?: None Help needed moving to and from a bed to a chair (including a wheelchair)?: A Little Help needed standing up from a chair using your arms (e.g., wheelchair or bedside chair)?: A Little Help needed to walk in hospital room?: A Little Help needed climbing 3-5 steps with a railing? : A Little 6 Click Score: 20    End of Session Equipment Utilized During Treatment: Gait belt Activity Tolerance: Patient tolerated treatment well Patient left: in bed;with call bell/phone within reach Nurse Communication: Mobility status PT Visit Diagnosis: Unsteadiness on feet (R26.81);Difficulty in walking, not elsewhere classified (  R26.2);Muscle weakness (generalized) (M62.81);Hemiplegia and hemiparesis Hemiplegia - Right/Left: Right Hemiplegia - dominant/non-dominant: Dominant Hemiplegia - caused by: Cerebral infarction    Time: 0211-1735 PT Time Calculation (min) (ACUTE ONLY): 42 min   Charges:   PT Evaluation $PT Eval Moderate Complexity: 1 Mod PT Treatments $Therapeutic Exercise: 8-22 mins       D. Scott Britnay Magnussen PT, DPT 01/26/21, 1:50 PM

## 2021-01-26 NOTE — Progress Notes (Signed)
Monitor ordered per request of neurology for acute CVA.

## 2021-01-26 NOTE — Progress Notes (Signed)
PROGRESS NOTE    Morgan Nicholson  MMN:[id number] DOB: 1945/10/26 DOA: 01/24/2021 PCP: Kirk Ruths, MD    Brief Narrative:  This is a no charge note as patient was admitted this AM.  H&P reviewed patient seen and examined. Morgan Nicholson is a 75 y.o. female with medical history significant for HTN, DM, CKD 3, HLD who presents with acute onset weakness of bilateral lower extremities right greater than left with difficulty ambulating starting at around 2 PM, arriving to the ED at 7 PM.  She denies headache or visual disturbance or weakness numbness or tingling in the arm or face.  She was previously in her usual state of health and denies any recent illness   MRI with small focus of acute ischemia at the base of the left central sulcus.  No hemorrhage or mass-effect.  Chronic microvascular ischemia  10/17 BG in 300-400's  Consultants:  nephrology  Procedures:   Antimicrobials:     Subjective: Feels weak today. No sob, no cp  Objective: Vitals:   01/26/21 0857 01/26/21 1020 01/26/21 1230 01/26/21 1519  BP: (!) 149/88 123/61 126/65 99/69  Pulse:      Resp:  13 13 18   Temp:    98.4 F (36.9 C)  TempSrc:    Oral  SpO2:    97%  Weight:      Height:        Intake/Output Summary (Last 24 hours) at 01/26/2021 1837 Last data filed at 01/26/2021 1535 Gross per 24 hour  Intake 893.83 ml  Output --  Net 893.83 ml   Filed Weights   01/24/21 1556  Weight: 78.9 kg    Examination:  General exam: Appears calm and comfortable  Respiratory system: Clear to auscultation. Respiratory effort normal. Cardiovascular system: S1 & S2 heard, RRR. No JVD, murmurs, rubs, gallops or clicks. No pedal edema. Gastrointestinal system: Abdomen is nondistended, soft and nontender. No organomegaly or masses felt. Normal bowel sounds heard. Central nervous system: Alert and oriented.RLE 4/5 strenght, rest x3 is 5/5. No facial droop. Other neuroexam grossly intact Extremities:no  edema Psychiatry: Judgement and insight appear normal. Mood & affect appropriate.     Data Reviewed: I have personally reviewed following labs and imaging studies  CBC: Recent Labs  Lab 01/24/21 1606 01/25/21 0654 01/26/21 0944  WBC 10.6* 11.3* 11.1*  HGB 13.4 13.2 13.5  HCT 38.9 39.2 38.5  MCV 90.3 90.1 89.7  PLT 247 244 903   Basic Metabolic Panel: Recent Labs  Lab 01/24/21 1606 01/25/21 0654 01/26/21 1255  NA 135  --   --   K 4.2  --   --   CL 102  --   --   CO2 21*  --   --   GLUCOSE 292*  --  450*  BUN 21  --   --   CREATININE 1.13* 0.98  --   CALCIUM 9.6  --   --   MG 1.7  --   --    GFR: Estimated Creatinine Clearance: 51.5 mL/min (by C-G formula based on SCr of 0.98 mg/dL). Liver Function Tests: No results for input(s): AST, ALT, ALKPHOS, BILITOT, PROT, ALBUMIN in the last 168 hours. No results for input(s): LIPASE, AMYLASE in the last 168 hours. No results for input(s): AMMONIA in the last 168 hours. Coagulation Profile: No results for input(s): INR, PROTIME in the last 168 hours. Cardiac Enzymes: No results for input(s): CKTOTAL, CKMB, CKMBINDEX, TROPONINI in the last 168 hours. BNP (last  3 results) No results for input(s): PROBNP in the last 8760 hours. HbA1C: Recent Labs    01/25/21 0654  HGBA1C 8.7*   CBG: Recent Labs  Lab 01/25/21 2133 01/26/21 0800 01/26/21 1243 01/26/21 1411 01/26/21 1628  GLUCAP 309* 290* 423* 421* 294*   Lipid Profile: Recent Labs    01/25/21 0654  CHOL 122  HDL 42  LDLCALC 44  TRIG 181*  CHOLHDL 2.9   Thyroid Function Tests: Recent Labs    01/24/21 1606  TSH 2.754   Anemia Panel: No results for input(s): VITAMINB12, FOLATE, FERRITIN, TIBC, IRON, RETICCTPCT in the last 72 hours. Sepsis Labs: No results for input(s): PROCALCITON, LATICACIDVEN in the last 168 hours.  Recent Results (from the past 240 hour(s))  Resp Panel by RT-PCR (Flu A&B, Covid) Nasopharyngeal Swab     Status: None   Collection  Time: 01/24/21  8:21 PM   Specimen: Nasopharyngeal Swab; Nasopharyngeal(NP) swabs in vial transport medium  Result Value Ref Range Status   SARS Coronavirus 2 by RT PCR NEGATIVE NEGATIVE Final    Comment: (NOTE) SARS-CoV-2 target nucleic acids are NOT DETECTED.  The SARS-CoV-2 RNA is generally detectable in upper respiratory specimens during the acute phase of infection. The lowest concentration of SARS-CoV-2 viral copies this assay can detect is 138 copies/mL. A negative result does not preclude SARS-Cov-2 infection and should not be used as the sole basis for treatment or other patient management decisions. A negative result may occur with  improper specimen collection/handling, submission of specimen other than nasopharyngeal swab, presence of viral mutation(s) within the areas targeted by this assay, and inadequate number of viral copies(<138 copies/mL). A negative result must be combined with clinical observations, patient history, and epidemiological information. The expected result is Negative.  Fact Sheet for Patients:  EntrepreneurPulse.com.au  Fact Sheet for Healthcare Providers:  IncredibleEmployment.be  This test is no t yet approved or cleared by the [place] FDA and  has been authorized for detection and/or diagnosis of SARS-CoV-2 by FDA under an Emergency Use Authorization (EUA). This EUA will remain  in effect (meaning this test can be used) for the duration of the COVID-19 declaration under Section 564(b)(1) of the Act, 21 U.S.C.section 360bbb-3(b)(1), unless the authorization is terminated  or revoked sooner.       Influenza A by PCR NEGATIVE NEGATIVE Final   Influenza B by PCR NEGATIVE NEGATIVE Final    Comment: (NOTE) The Xpert Xpress SARS-CoV-2/FLU/RSV plus assay is intended as an aid in the diagnosis of influenza from Nasopharyngeal swab specimens and should not be used as a sole basis for treatment. Nasal washings  and aspirates are unacceptable for Xpert Xpress SARS-CoV-2/FLU/RSV testing.  Fact Sheet for Patients: EntrepreneurPulse.com.au  Fact Sheet for Healthcare Providers: IncredibleEmployment.be  This test is not yet approved or cleared by the [place] FDA and has been authorized for detection and/or diagnosis of SARS-CoV-2 by FDA under an Emergency Use Authorization (EUA). This EUA will remain in effect (meaning this test can be used) for the duration of the COVID-19 declaration under Section 564(b)(1) of the Act, 21 U.S.C. section 360bbb-3(b)(1), unless the authorization is terminated or revoked.  Performed at [facility], [address] [place], [place] [address]          Radiology Studies: CT ANGIO HEAD NECK W WO CM  Result Date: 01/25/2021 CLINICAL DATA:  Neuro deficit, acute, stroke suspected EXAM: CT ANGIOGRAPHY HEAD AND NECK TECHNIQUE: Multidetector CT imaging of the head and neck was performed  using the standard protocol during bolus administration of intravenous contrast. Multiplanar CT image reconstructions and MIPs were obtained to evaluate the vascular anatomy. Carotid stenosis measurements (when applicable) are obtained utilizing NASCET criteria, using the distal internal carotid diameter as the denominator. CONTRAST:  44mL OMNIPAQUE IOHEXOL 350 MG/ML SOLN COMPARISON:  MRI and CT 01/24/2021. FINDINGS: CT HEAD FINDINGS Brain: Small acute infarct in the region of the a left central sulcus, better characterized on recent MRI. No evidence of interval acute large vascular territory infarct, acute hemorrhage, mass effect, or midline shift. Vascular: See below. Skull: No acute fracture. Sinuses: Visualized sinuses are largely clear. Orbits: No acute findings in the visualized orbits. Review of the MIP images confirms the above findings CTA NECK FINDINGS Aortic arch: Great vessel origins are patent. Right carotid system: Predominately  calcific atherosclerosis at the carotid bifurcation with approximately 55% stenosis of the ICA origin. Left carotid system: Mild-to-moderate stenosis of the common carotid artery due to calcific atherosclerosis. Predominately calcific atherosclerosis at the carotid bifurcation with approximately 45% stenosis Vertebral arteries: Right dominant. Moderate (approximately 40%) stenosis of the right vertebral artery origin. Severe left vertebral artery origin stenosis. Skeleton: Mild for age multilevel degenerative change of the cervical spine. Osteopenia. Other neck: No acute abnormality. Subcentimeter thyroid nodules. Not clinically significant; no follow-up imaging recommended (ref: J Am Coll Radiol. 2015 Feb;12(2): 143-50). Upper chest: Visualized lung apices are clear. Review of the MIP images confirms the above findings CTA HEAD FINDINGS Anterior circulation: Calcific atherosclerosis of bilateral paraclinoid ICAs with mild (approximately 20%) right paraclinoid ICA stenosis. Bilateral MCAs and ACAs patent without proximal hemodynamically significant stenosis. No aneurysm identified. Posterior circulation: Mild atherosclerosis right intradural vertebral artery. Bilateral intradural vertebral arteries, basilar artery, and posterior cerebral arteries are patent without proximal hemodynamically significant stenosis. No aneurysm identified. Venous sinuses: As permitted by contrast timing, patent. Review of the MIP images confirms the above findings IMPRESSION: CT head: 1. Small acute infarct in the region of the a left central sulcus, better characterized on recent MRI. 2. No evidence of interval acute intracranial abnormality. CTA head: 1. No large vessel occlusion or proximal hemodynamically significant stenosis. 2. Mild right paraclinoid ICA stenosis. CTA neck: 1. Bilateral carotid bifurcation atherosclerosis with approximately 55% right and 45% left proximal ICA stenosis. 2. Severe left and moderate right vertebral  artery origin stenosis. Electronically Signed   By: Margaretha Sheffield M.D.   On: 01/25/2021 11:41   CT Head Wo Contrast  Result Date: 01/24/2021 CLINICAL DATA:  Leg weakness and difficulty ambulating EXAM: CT HEAD WITHOUT CONTRAST TECHNIQUE: Contiguous axial images were obtained from the base of the skull through the vertex without intravenous contrast. COMPARISON:  None. FINDINGS: Brain: No evidence of acute infarction, hemorrhage, hydrocephalus, extra-axial collection or mass lesion/mass effect. Vascular: No hyperdense vessel or unexpected calcification. Skull: Normal. Negative for fracture or focal lesion. Sinuses/Orbits: No acute finding. Other: None. IMPRESSION: No acute intracranial abnormality noted. Electronically Signed   By: Inez Catalina M.D.   On: 01/24/2021 20:11   MR BRAIN WO CONTRAST  Result Date: 01/24/2021 CLINICAL DATA:  Lower extremity weakness EXAM: MRI HEAD WITHOUT CONTRAST TECHNIQUE: Multiplanar, multiecho pulse sequences of the brain and surrounding structures were obtained without intravenous contrast. COMPARISON:  None. FINDINGS: Brain: There is a focus of abnormal diffusion restriction at the base of left central sulcus. No other diffusion abnormality. No acute or chronic hemorrhage. There is multifocal hyperintense T2-weighted signal within the white matter. Parenchymal volume and CSF spaces are normal. The midline  structures are normal. Vascular: Major flow voids are preserved. Skull and upper cervical spine: Normal calvarium and skull base. Visualized upper cervical spine and soft tissues are normal. Sinuses/Orbits:No paranasal sinus fluid levels or advanced mucosal thickening. No mastoid or middle ear effusion. Normal orbits. IMPRESSION: 1. Small focus of acute ischemia at the base of the left central sulcus. No hemorrhage or mass effect. 2. Findings of chronic microvascular ischemia. Electronically Signed   By: Ulyses Jarred M.D.   On: 01/24/2021 23:21   US Carotid Bilateral  (at [facility] and [facility] only)  Result Date: 01/25/2021 CLINICAL DATA:  Stroke EXAM: BILATERAL CAROTID DUPLEX ULTRASOUND TECHNIQUE: Pearline Cables scale imaging, color Doppler and duplex ultrasound were performed of bilateral carotid and vertebral arteries in the neck. COMPARISON:  None. FINDINGS: Criteria: Quantification of carotid stenosis is based on velocity parameters that correlate the residual internal carotid diameter with NASCET-based stenosis levels, using the diameter of the distal internal carotid lumen as the denominator for stenosis measurement. The following velocity measurements were obtained: RIGHT ICA: 122 cm/sec CCA: 85 cm/sec SYSTOLIC ICA/CCA RATIO:  1.4 ECA: 128 cm/sec LEFT ICA: 113 cm/sec CCA: 409 cm/sec SYSTOLIC ICA/CCA RATIO:  1.1 ECA: 95 cm/sec RIGHT CAROTID ARTERY: Mild to moderate amount of calcified plaque at the carotid bifurcation. RIGHT VERTEBRAL ARTERY:  Appropriate antegrade flow. LEFT CAROTID ARTERY: Moderate amount of calcified plaque at the carotid bifurcation and within the proximal LEFT ICA LEFT VERTEBRAL ARTERY:  Appropriate antegrade flow Upper extremity blood pressures: Not provided. IMPRESSION: Moderate amount of calcified plaque at the bilateral carotid artery bifurcations, but no occlusion or evidence of hemodynamically significant stenosis within the bilateral common carotid or internal carotid arteries based on velocity measurements. Electronically Signed   By: Franki Cabot M.D.   On: 01/25/2021 05:41   ECHOCARDIOGRAM COMPLETE  Result Date: 01/25/2021    ECHOCARDIOGRAM REPORT   Patient Name:   Kiesha A Rizzo Date of Exam: 01/25/2021 Medical Rec #:  [id number]    Height:       65.0 in Accession #:    [id number]   Weight:       174.0 lb Date of Birth:  [birth year]    BSA:          1.864 m Patient Age:    29 years     BP:           176/92 mmHg Patient Gender: F            HR:           80 bpm. Exam Location:  [facility] Procedure: 2D Echo, Cardiac Doppler and Color Doppler Indications:      Stroke I63.9  History:         Patient has no prior history of Echocardiogram examinations.                  Risk Factors:Hypertension and Diabetes.  Sonographer:     Alyse Low Roar Referring Phys:  [id number] Athena Masse Diagnosing Phys: Ida Rogue MD IMPRESSIONS  1. Left ventricular ejection fraction, by estimation, is 60 to 65%. The left ventricle has normal function. The left ventricle has no regional wall motion abnormalities. There is mild left ventricular hypertrophy. Left ventricular diastolic parameters are consistent with Grade I diastolic dysfunction (impaired relaxation).  2. Right ventricular systolic function is normal. The right ventricular size is normal. There is normal pulmonary artery systolic pressure. The estimated right ventricular systolic pressure is 95.2 mmHg.  3. Left atrial size was mildly dilated.  4. The inferior vena cava is normal in size with greater than 50% respiratory variability, suggesting right atrial pressure of 3 mmHg. FINDINGS  Left Ventricle: Left ventricular ejection fraction, by estimation, is 60 to 65%. The left ventricle has normal function. The left ventricle has no regional wall motion abnormalities. The left ventricular internal cavity size was normal in size. There is  mild left ventricular hypertrophy. Left ventricular diastolic parameters are consistent with Grade I diastolic dysfunction (impaired relaxation). Right Ventricle: The right ventricular size is normal. No increase in right ventricular wall thickness. Right ventricular systolic function is normal. There is normal pulmonary artery systolic pressure. The tricuspid regurgitant velocity is 2.59 m/s, and  with an assumed right atrial pressure of 5 mmHg, the estimated right ventricular systolic pressure is 42.5 mmHg. Left Atrium: Left atrial size was mildly dilated. Right Atrium: Right atrial size was normal in size. Pericardium: There is no evidence of pericardial effusion. Mitral Valve: The mitral valve is  normal in structure. Mild mitral annular calcification. No evidence of mitral valve regurgitation. No evidence of mitral valve stenosis. Tricuspid Valve: The tricuspid valve is normal in structure. Tricuspid valve regurgitation is mild . No evidence of tricuspid stenosis. Aortic Valve: The aortic valve was not well visualized. Aortic valve regurgitation is not visualized. No aortic stenosis is present. Aortic valve peak gradient measures 13.8 mmHg. Pulmonic Valve: The pulmonic valve was normal in structure. Pulmonic valve regurgitation is not visualized. No evidence of pulmonic stenosis. Aorta: The aortic root is normal in size and structure. Venous: The inferior vena cava is normal in size with greater than 50% respiratory variability, suggesting right atrial pressure of 3 mmHg. IAS/Shunts: No atrial level shunt detected by color flow Doppler.  LEFT VENTRICLE PLAX 2D LVIDd:         4.12 cm   Diastology LVIDs:         2.67 cm   LV e' medial:    5.66 cm/s LV PW:         1.04 cm   LV E/e' medial:  12.4 LV IVS:        1.22 cm   LV e' lateral:   8.70 cm/s LVOT diam:     1.80 cm   LV E/e' lateral: 8.1 LVOT Area:     2.54 cm  RIGHT VENTRICLE RV Basal diam:  2.59 cm RV Mid diam:    2.37 cm RV S prime:     12.90 cm/s TAPSE (M-mode): 1.5 cm LEFT ATRIUM             Index        RIGHT ATRIUM           Index LA diam:        3.90 cm 2.09 cm/m   RA Area:     10.80 cm LA Vol (A2C):   51.2 ml 27.46 ml/m  RA Volume:   20.10 ml  10.78 ml/m LA Vol (A4C):   51.9 ml 27.84 ml/m LA Biplane Vol: 51.8 ml 27.78 ml/m  AORTIC VALVE                 PULMONIC VALVE AV Area (Vmax): 1.78 cm     PV Vmax:        1.07 m/s AV Vmax:        185.50 cm/s  PV Peak grad:   4.6 mmHg AV Peak Grad:   13.8 mmHg    RVOT Peak grad: 4 mmHg LVOT Vmax:  130.00 cm/s  AORTA Ao Root diam: 3.10 cm MITRAL VALVE                TRICUSPID VALVE MV Area (PHT): 3.83 cm     TR Peak grad:   26.8 mmHg MV Decel Time: 198 msec     TR Vmax:        259.00 cm/s MV E  velocity: 70.30 cm/s MV A velocity: 116.00 cm/s  SHUNTS MV E/A ratio:  0.61         Systemic Diam: 1.80 cm MV A Prime:    12.5 cm/s Ida Rogue MD Electronically signed by Ida Rogue MD Signature Date/Time: 01/25/2021/1:35:54 PM    Final         Scheduled Meds:   stroke: mapping our early stages of recovery book   Does not apply Once   aspirin EC  81 mg Oral Daily   atorvastatin  80 mg Oral QPM   carvedilol  12.5 mg Oral BID   clopidogrel  75 mg Oral Daily   enoxaparin (LOVENOX) injection  40 mg Subcutaneous Q24H   insulin aspart  0-15 Units Subcutaneous TID WC   insulin aspart  0-5 Units Subcutaneous QHS   insulin aspart  10 Units Subcutaneous TID WC   [START ON 01/27/2021] insulin glargine-yfgn  20 Units Subcutaneous Daily   lisinopril  40 mg Oral Daily   Continuous Infusions:  sodium chloride 50 mL/hr at 01/25/21 0945    Assessment & Plan:   Principal Problem:   Acute CVA (cerebrovascular accident) [facility]) Active Problems:   HTN (hypertension), benign   Hyperglycemia due to type 2 diabetes mellitus ([place])   Chronic kidney disease, stage 3a ([place])   Elevated troponin   Acute CVA (cerebrovascular accident) ([place]) -MRI with small focus of acute ischemia at the base of the left central sulcus 10/17 neurology following Cta obtained, see results Goal LDL <70 PT/OT- rec. HH Gradual bp control, avoid hypotension Will need monitor placed prior to discharge by cardiology  Goal A1C <7% F/u neurology as outpt ASA 81mg  qd +75mg  QD x3 weeks. statin       Elevated troponin  Likely demand ischemia 2/2 stroke No cp TP trended down ECHo nml EF and No WMA    Hyperglycemia due to type 2 diabetes mellitus ([facility]) BG uncontrolled A1c 8.7 Start lantus 20units qd Novolog 10 units tid with meals      HTN (hypertension), benign Permissive hypertension..>>>>slowly to normotensive Avoid hypotension     Chronic kidney disease, stage 3a ([place]) Renal function improved with ivf      DVT prophylaxis: lovenox Code Status:full Family Communication: none at bedside Disposition Plan:  Status is: Inpatient    Remains inpatient appropriate because:Inpatient level of care appropriate due to severity of illness   Dispo: The patient is from: Home              Anticipated d/c is to: Home              Patient currently is not medically stable to d/c.              Difficult to place patient No            LOS: 1 day   Time spent: 45 min with >50% ON COC    Nolberto Hanlon, MD [facility] Pager [phone]  If 7PM-7AM, please contact night-coverage 01/26/2021, 6:37 PM

## 2021-01-26 NOTE — ED Notes (Signed)
Informed RN bed assigned 

## 2021-01-26 NOTE — Progress Notes (Addendum)
Neurology Progress Note  Patient ID: Morgan Nicholson is a 75 y.o.  with PMHx DM, hypertension, hypercholesterolemia, CKD who presents with leg weakness that started around 2 PM on 10/15, which she initially reported was bilateral but based on imaging, history and examination, suspected to be decompensation of chronic  left leg weakness secondary to acute onset right leg weakness secondary to a left parietal stroke   Subjective: No acute complaints, had just worked with physical therapy at the time of my evaluation and felt like she did have significant difficulty walking still but improved since presentation  Exam: Vitals:   01/26/21 0145 01/26/21 0540  BP: 118/78 128/66  Pulse: 86 69  Resp: 18 18  Temp: 98.4 F (36.9 C) 98.7 F (37.1 C)  SpO2: 96% 97%   Gen: In bed, comfortable  Resp: non-labored breathing, no grossly audible wheezing Cardiac: Perfusing extremities well  Abd: soft, nt  Neuro:   Mental Status: Patient is awake, alert, oriented to person, place, month, year, and situation. Patient is able to give a clear and coherent history. No signs of aphasia or neglect Cranial Nerves: II: Visual Fields are full. III,IV, VI: EOMI without ptosis or diploplia.  V: Facial sensation is symmetric to temperature VII: Facial movement with mild right NL fold flattening VIII: hearing is intact to voice X: Uvula elevates symmetrically XI: Shoulder shrug is symmetric. XII: tongue is midline without atrophy or fasciculations.  Motor: Tone is normal. Bulk is normal. 5/5 strength was present in bilateral UE with the exception of very mild right deltoid weakness. 4/5 RLE hip flexion, 4+/5 on the left Sensory: Sensation is diminished in the high cervical region on the right, C1 and C2 area.   Cerebellar: Finger-nose intact bilaterally  Pertinent Labs:   Basic Metabolic Panel: Recent Labs  Lab 01/24/21 1606 01/25/21 0654  NA 135  --   K 4.2  --   CL 102  --   CO2 21*  --    GLUCOSE 292*  --   BUN 21  --   CREATININE 1.13* 0.98  CALCIUM 9.6  --   MG 1.7  --     CBC: Recent Labs  Lab 01/24/21 1606 01/25/21 0654  WBC 10.6* 11.3*  HGB 13.4 13.2  HCT 38.9 39.2  MCV 90.3 90.1  PLT 247 244    Coagulation Studies: No results for input(s): LABPROT, INR in the last 72 hours.   Lab Results  Component Value Date   HGBA1C 8.7 (H) 01/25/2021   Lab Results  Component Value Date   CHOL 122 01/25/2021   HDL 42 01/25/2021   LDLCALC 44 01/25/2021   TRIG 181 (H) 01/25/2021   CHOLHDL 2.9 01/25/2021   Results for BRANNON, DECAIRE (MRN [id number]) as of 01/26/2021 08:25  Ref. Range 01/24/2021 16:06 01/24/2021 23:26 01/25/2021 06:54  Troponin I (High Sensitivity) Latest Ref Range: <18 ng/L 41 (H) 113 (HH) 142 (HH)    ECHO (TTE)   1. Left ventricular ejection fraction, by estimation, is 60 to 65%. The left ventricle has normal function. The left ventricle has no regional  wall motion abnormalities. There is mild left ventricular hypertrophy. Left ventricular diastolic parameters are consistent with Grade I diastolic dysfunction (impaired relaxation).   2. Right ventricular systolic function is normal. The right ventricular size is normal. There is normal pulmonary artery systolic pressure. The  estimated right ventricular systolic pressure is 18.5 mmHg.    3. Left atrial size was mildly dilated.   4.  The inferior vena cava is normal in size with greater than 50% respiratory variability, suggesting right atrial pressure of 3 mmHg.   CT angio head and neck personally reviewed, agree with radiology:  CT head: 1. Small acute infarct in the region of the a left central sulcus, better characterized on recent MRI. 2. No evidence of interval acute intracranial abnormality.   CTA head:   1. No large vessel occlusion or proximal hemodynamically significant stenosis. 2. Mild right paraclinoid ICA stenosis.   CTA neck: 1. Bilateral carotid bifurcation atherosclerosis  with approximately 55% right and 45% left proximal ICA stenosis. 2. Severe left and moderate right vertebral artery origin stenosis.    Impression: Left parietal stroke, most likely etiology small vessel disease versus potentially embolic.  From a stroke perspective her work-up is complete although it appears her blood sugar remains challenging to control.  She will benefit from event monitor placement to rule out paroxysmal atrial fibrillation as an etiology, which should be followed up outpatient by cardiology, neurology or her primary care physician  Recommendations:  # Left parietal stroke - Appreciate PT/OT recs - A1c above goal and sugar has been hard to control, appreciate primary team management and PCP follow-up for goal < 7%, discussed with patient - Normotension BP goal given patient has been tolerating normotension here well this morning and is >24 hours out from stroke; however any BP medication adjustments should be gradual to avoid hypotension.  - Reached out to cardiology for placement of event monitor prior to discharge  - Patient prefers outpatient follow-up with [facility] neurology, should be given number to call for appointment [phone]  # Leukocytosis - Workup per primary team and PCP  Neurology will be available on an as-needed basis going forward, please reach out if any questions or concerns arise  Lesleigh Noe MD-PhD [facility] [phone]  [facility] coverage for [facility] is from 8 AM to 4 AM in-house and 4 PM to 8 PM by telephone/video. 8 PM to 8 AM emergent questions or overnight urgent questions should be addressed to Teleneurology On-call or [facility] neurohospitalist; contact information can be found on AMION  Greater than 35 minutes were spent in care of this patient today, greater than 50% at bedside in discussion of the work-up and plan as documented above

## 2021-01-26 NOTE — Progress Notes (Signed)
SLP Cancellation Note  Patient Details Name: ALFREDA HAMMAD MRN: [id number] DOB: 17-Sep-1945   Cancelled treatment:        Chart reviewed. Discussion with Nsg. Speech is clear and appropriate, no dysphagia. No ST eval warranted at this time. Please reconsult if we can be of further assist.   Lucila Maine 01/26/2021, 1:15 PM

## 2021-01-26 NOTE — Progress Notes (Signed)
Inpatient Diabetes Program Recommendations  AACE/ADA: New Consensus Statement on Inpatient Glycemic Control (2015)  Target Ranges:  Prepandial:   less than 140 mg/dL      Peak postprandial:   less than 180 mg/dL (1-2 hours)      Critically ill patients:  140 - 180 mg/dL   Lab Results  Component Value Date   GLUCAP 290 (H) 01/26/2021   HGBA1C 8.7 (H) 01/25/2021    Review of Glycemic Control  Diabetes history: DM 2 Outpatient Diabetes medications: Amaryl 2 mg Daily at supper, Metformin 1000 mg bid, 70/30 26 units bid (equivalent of 36.4 units of basal insulin and 15.6 of short acting to cover meal intake) Current orders for Inpatient glycemic control:  Semglee 10 units Novolog 0-15 units tid + hs Novolog 5 units tid meal coverage  A1c 8.7% on 10/16   Note: Semglee and meal coverage added this am. Based on home dose may need more basal insulin again tonight if glucose trends still elevated at supper time.  Spoke with pt at bedside regarding A1c level of 8.7% and glucose control at home in the setting of CVA. Pt reports being consistent with her medications at home and checks her glucose every morning and almost every evening. Pt reports that in the mornings her glucose trends are in the 120's, and in the evenings her glucose trends are usually 150 or less. Pt expresses that when her glucose is 130 she often times drops overnight around 1-2 am (usually when 70/30 insulin peaks). Pt reports eating a snack every night to prevent hypoglycemia.   Suggested to pt to follow her glucose trends closely between now and her next PCP appt (late November) and to let her PCP see them. I explained she may need a decrease in her evening dose of 70/30 so she does not drop overnight, instead of having to eat snacks that raise her blood sugar up.  Encouraged glucose control.   Thanks,  Tama Headings RN, MSN, BC-ADM Inpatient Diabetes Coordinator Team Pager [phone] (8a-5p)

## 2021-01-26 NOTE — Evaluation (Signed)
Occupational Therapy Evaluation Patient Details Name: MERISSA RENWICK MRN: [id number] DOB: 1945/04/28 Today's Date: 01/26/2021   History of Present Illness 75 y.o. female with medical history significant for HTN, DM, CKD 3, HLD who presents with acute onset weakness of bilateral lower extremities (right greater than left) with difficulty ambulating. MRI revealed small focus of acute ischemia at the base of the left central sulcus.  No hemorrhage or mass-effect.  Chronic microvascular ischemia   Clinical Impression   Pt seen for OT evaluation this date. Prior to admission, pt was independent in all ADLs/IADLs and functional mobility, living alone in a 1-story mobile home with 3 steps to enter. Pt currently presents with decreased balance and requires SUPERVISION/SET-UP for seated ADLs, MIN A for functional mobility without AD (MIN GUARD for functional mobility with RW), and MIN GUARD for toilet transfers/hygiene. Pt would benefit from additional skilled OT services to maximize return to PLOF. Upon discharge, recommend HHOT and 24/7 supervision/assistance.     Recommendations for follow up therapy are one component of a multi-disciplinary discharge planning process, led by the attending physician.  Recommendations may be updated based on patient status, additional functional criteria and insurance authorization.   Follow Up Recommendations  Home health OT;Supervision/Assistance - 24 hour    Equipment Recommendations  Tub/shower bench;Other (comment) (2ww)       Precautions / Restrictions Precautions Precautions: Fall Restrictions Weight Bearing Restrictions: No      Mobility Bed Mobility Overal bed mobility: Modified Independent             General bed mobility comments: Able to perform with HOB elevated and use of bedrails    Transfers Overall transfer level: Needs assistance Equipment used: Rolling walker (2 wheeled) Transfers: Sit to/from Stand Sit to Stand: Min assist;Min  guard         General transfer comment: MIN A for initial steadying when upright without AD. MIN GUARD with use of RW    Balance Overall balance assessment: Needs assistance Sitting-balance support: No upper extremity supported;Feet unsupported Sitting balance-Leahy Scale: Good Sitting balance - Comments: Good sitting balance reaching within BOS at EOB   Standing balance support: During functional activity;No upper extremity supported Standing balance-Leahy Scale: Poor Standing balance comment: Requires MIN A fot steadying when walking without AD                           ADL either performed or assessed with clinical judgement   ADL Overall ADL's : Needs assistance/impaired     Grooming: Wash/dry hands;Supervision/safety;Standing               Lower Body Dressing: Supervision/safety;Sitting/lateral leans   Toilet Transfer: Min guard;Ambulation;Regular Toilet;Grab bars Toilet Transfer Details (indicate cue type and reason): Requires verbal cues for safe hand placement with RW use Toileting- Clothing Manipulation and Hygiene: Min guard;Sitting/lateral lean       Functional mobility during ADLs: Minimal assistance;Min guard;Rolling walker (MIN A without AD. MIN GUARD with RW)       Vision Baseline Vision/History: 1 Wears glasses              Pertinent Vitals/Pain Pain Assessment: Faces Faces Pain Scale: Hurts a little bit Pain Location: back Pain Descriptors / Indicators: Aching Pain Intervention(s): Limited activity within patient's tolerance;Monitored during session;Repositioned        Extremity/Trunk Assessment Upper Extremity Assessment Upper Extremity Assessment: Overall WFL for tasks assessed   Lower Extremity Assessment Lower Extremity Assessment: Defer to  PT evaluation   Cervical / Trunk Assessment Cervical / Trunk Assessment: Normal   Communication Communication Communication: No difficulties   Cognition Arousal/Alertness:  Awake/alert Behavior During Therapy: WFL for tasks assessed/performed Overall Cognitive Status: Within Functional Limits for tasks assessed                                 General Comments: A&Ox4. Pleasant and agreeable throughout   General Comments  SpO2 >92% on RA throughout session    Exercises Other Exercises Other Exercises: Educated pt on role of OT, POC, and d/c recommendations        Home Living Family/patient expects to be discharged to:: Private residence Living Arrangements: Alone Available Help at Discharge: Family;Available PRN/intermittently Type of Home: Mobile home Home Access: Stairs to enter Entrance Stairs-Number of Steps: 3 Entrance Stairs-Rails: Left Home Layout: One level     Bathroom Shower/Tub: Tub/shower unit         Home Equipment: None          Prior Functioning/Environment Level of Independence: Independent        Comments: At baseline, pt is independent with ADLs/IADLs and functional mobility without AD        OT Problem List: Impaired balance (sitting and/or standing);Decreased knowledge of use of DME or AE      OT Treatment/Interventions: Self-care/ADL training;Therapeutic exercise;DME and/or AE instruction;Therapeutic activities;Patient/family education;Balance training    OT Goals(Current goals can be found in the care plan section) Acute Rehab OT Goals Patient Stated Goal: to go home safely OT Goal Formulation: With patient Time For Goal Achievement: 02/09/21 Potential to Achieve Goals: Good  OT Frequency: Min 1X/week   Barriers to D/C:         AM-PAC OT "6 Clicks" Daily Activity     Outcome Measure Help from another person eating meals?: None Help from another person taking care of personal grooming?: A Little Help from another person toileting, which includes using toliet, bedpan, or urinal?: A Little Help from another person bathing (including washing, rinsing, drying)?: A Lot Help from another  person to put on and taking off regular upper body clothing?: None Help from another person to put on and taking off regular lower body clothing?: A Little 6 Click Score: 19   End of Session Equipment Utilized During Treatment: Gait belt;Rolling walker Nurse Communication: Mobility status  Activity Tolerance: Patient tolerated treatment well Patient left: in bed;with call bell/phone within reach  OT Visit Diagnosis: Unsteadiness on feet (R26.81)                Time: 5681-2751 OT Time Calculation (min): 30 min Charges:  OT General Charges $OT Visit: 1 Visit OT Evaluation $OT Eval Moderate Complexity: 1 Mod OT Treatments $Self Care/Home Management : 8-22 mins  Fredirick Maudlin, OTR/L [place]

## 2021-01-27 ENCOUNTER — Inpatient Hospital Stay (HOSPITAL_COMMUNITY)
Admit: 2021-01-27 | Discharge: 2021-01-27 | Disposition: A | Discharge status: Home / Self Care | Payer: Medicare HMO | Attending: Physician Assistant | Admitting: Physician Assistant

## 2021-01-27 DIAGNOSIS — I639 Cerebral infarction, unspecified: Secondary | ICD-10-CM | POA: Diagnosis not present

## 2021-01-27 LAB — GLUCOSE, CAPILLARY
Glucose-Capillary: 236 mg/dL — ABNORMAL HIGH (ref 70–99)
Glucose-Capillary: 279 mg/dL — ABNORMAL HIGH (ref 70–99)
Glucose-Capillary: 295 mg/dL — ABNORMAL HIGH (ref 70–99)

## 2021-01-27 MED ORDER — INSULIN GLARGINE-YFGN 100 UNIT/ML ~~LOC~~ SOLN
25.0000 [IU] | Freq: Every day | SUBCUTANEOUS | Status: DC
  Administered 2021-01-27: 25 [IU] via SUBCUTANEOUS
  Filled 2021-01-27 (×2): qty 0.25

## 2021-01-27 MED ORDER — CLOPIDOGREL BISULFATE 75 MG PO TABS: 75.0000 mg | ORAL_TABLET | Freq: Every day | ORAL | 0 refills | Status: AC

## 2021-01-27 MED ORDER — ASPIRIN EC 81 MG PO TBEC: 81.0000 mg | DELAYED_RELEASE_TABLET | Freq: Every evening | ORAL | 0 refills | Status: DC

## 2021-01-27 NOTE — Plan of Care (Signed)

## 2021-01-27 NOTE — Progress Notes (Signed)
Physical Therapy Treatment Patient Details Name: Morgan Nicholson MRN: [id number] DOB: 05/08/1945 Today's Date: 01/27/2021   History of Present Illness Pt is a 75 y.o. female with medical history significant for HTN, DM, CKD 3, HLD who presents with acute onset weakness of bilateral lower extremities (right greater than left) with difficulty ambulating. MRI revealed small focus of acute ischemia at the base of the left central sulcus.    PT Comments    Pt ultimately did well with PT session, she was able to safely manage a prolonged bout of ambulation.  She did not have any overt R knee buckling, but did show occasional hesitancy with WBing and/or foot placement - but had no LOBs or overt safety issues.  She did well with progressively difficult balance exercises - pt did require at least light HHA for dynamic tasks, tolerated eyes close, perturbations surprisingly well even with narrowing BOS.  Ultimately pt did well and showed ability to safely be home with family assist.  She had some anxiety about this on arrival but did appear to feel much more confident after performing exercises and activities well with PT today.    Recommendations for follow up therapy are one component of a multi-disciplinary discharge planning process, led by the attending physician.  Recommendations may be updated based on patient status, additional functional criteria and insurance authorization.  Follow Up Recommendations  Home health PT;Supervision/Assistance - 24 hour     Equipment Recommendations  Rolling walker with 5" wheels;3in1 (PT)    Recommendations for Other Services       Precautions / Restrictions Precautions Precautions: Fall Restrictions Weight Bearing Restrictions: No     Mobility  Bed Mobility Overal bed mobility: Modified Independent             General bed mobility comments: with HOB elevated    Transfers Overall transfer level: Needs assistance Equipment used: Rolling walker (2  wheeled) Transfers: Sit to/from Stand Sit to Stand: Supervision         General transfer comment: cuing for appropriate positioning and UE use, able to rise w/o assist  Ambulation/Gait Ambulation/Gait assistance: Supervision Gait Distance (Feet): 225 Feet Assistive device: Rolling walker (2 wheeled);1 person hand held assist       General Gait Details: Pt did well with prolonged bout of ambulation.  We did trial ~10 ft with single HHA - pt was safe and able to maintain balance but slower and more guarded - transitioned back to walker quickly as she was clearly more confident and steady with AD/both UEs   Stairs Stairs: Yes Stairs assistance: Supervision Stair Management: One rail Left;Sideways Number of Stairs: 5 General stair comments: Pt was able to ascend and descend steps w/o assist, significant UE use on rails but no safety concerns, etc   Wheelchair Mobility    Modified Rankin (Stroke Patients Only)       Balance Overall balance assessment: Needs assistance Sitting-balance support: No upper extremity supported;Feet unsupported Sitting balance-Leahy Scale: Good Sitting balance - Comments: Good sitting balance reaching within BOS   Standing balance support: Bilateral upper extremity supported;During functional activity Standing balance-Leahy Scale: Fair Standing balance comment: MIN GUARD for functional mobility of household distances with UE support from RW                            Cognition Arousal/Alertness: Awake/alert Behavior During Therapy: [facility] for tasks assessed/performed Overall Cognitive Status: Within Functional Limits for tasks assessed  General Comments: A&Ox4. Pleasant and agreeable throughout      Exercises Other Exercises Other Exercises: standing balance/NMRE exercises including: heel raises (3-5 second holds), static standing EO/EC shld width/NBOS with and w/o perturbations,  heel-toe ambulation, retro ambulation.  Multiple reps each, all with faded b/l HHA and consistent minimal standing and sitting rest breaks    General Comments        Pertinent Vitals/Pain Pain Assessment: No/denies pain Faces Pain Scale:  (minimal chronic soreness)    Home Living                      Prior Function            PT Goals (current goals can now be found in the care plan section) Acute Rehab PT Goals Patient Stated Goal: to go home safely Progress towards PT goals: Progressing toward goals    Frequency    7X/week      PT Plan Current plan remains appropriate    Co-evaluation              AM-PAC PT "6 Clicks" Mobility   Outcome Measure  Help needed turning from your back to your side while in a flat bed without using bedrails?: None Help needed moving from lying on your back to sitting on the side of a flat bed without using bedrails?: None Help needed moving to and from a bed to a chair (including a wheelchair)?: A Little Help needed standing up from a chair using your arms (e.g., wheelchair or bedside chair)?: A Little Help needed to walk in hospital room?: A Little Help needed climbing 3-5 steps with a railing? : A Little 6 Click Score: 20    End of Session Equipment Utilized During Treatment: Gait belt Activity Tolerance: Patient tolerated treatment well Patient left: in bed;with call bell/phone within reach Nurse Communication: Mobility status PT Visit Diagnosis: Unsteadiness on feet (R26.81);Difficulty in walking, not elsewhere classified (R26.2);Muscle weakness (generalized) (M62.81);Hemiplegia and hemiparesis Hemiplegia - Right/Left: Right Hemiplegia - dominant/non-dominant: Dominant Hemiplegia - caused by: Cerebral infarction     Time: 1110-1135 PT Time Calculation (min) (ACUTE ONLY): 25 min  Charges:  $Gait Training: 8-22 mins $Therapeutic Exercise: 8-22 mins                     Kreg Shropshire, DPT 01/27/2021, 2:51  PM

## 2021-01-27 NOTE — Progress Notes (Signed)
Occupational Therapy Treatment Patient Details Name: Morgan Nicholson MRN: [id number] DOB: Jun 03, 1945 Today's Date: 01/27/2021   History of present illness Pt is a 75 y.o. female with medical history significant for HTN, DM, CKD 3, HLD who presents with acute onset weakness of bilateral lower extremities (right greater than left) with difficulty ambulating. MRI revealed small focus of acute ischemia at the base of the left central sulcus.   OT comments  Pt seen for OT treatment on this date. Upon arrival to room, pt awake and seated upright in bed with daughter present. Pt verbalizing concern regarding using bathroom during night. During session, pt practiced stand pivot transfer bed<>BSC, and was able to perform x2 stand pivot transfers to/from [facility], requiring SUPERVISION only. Pt's daughter educated on proper body positioning for assisting pt during functional mobility, with daughter demonstrating proper level of assistance required as pt walked 141ft with RW. At end of session, pt and daughter reported increased confidence returning home with pt's current level of assist required. Pt is making good progress toward goals and continues to benefit from skilled OT services to maximize return to PLOF and minimize risk of future falls, injury, caregiver burden, and readmission. Will continue to follow POC. Discharge recommendation remains appropriate.     Recommendations for follow up therapy are one component of a multi-disciplinary discharge planning process, led by the attending physician.  Recommendations may be updated based on patient status, additional functional criteria and insurance authorization.    Follow Up Recommendations  Home health OT;Supervision/Assistance - 24 hour    Equipment Recommendations  Other (comment);3 in 1 bedside commode (2ww)       Precautions / Restrictions Precautions Precautions: Fall Restrictions Weight Bearing Restrictions: No       Mobility Bed  Mobility Overal bed mobility: Modified Independent             General bed mobility comments: with HOB elevated    Transfers Overall transfer level: Needs assistance Equipment used: Rolling walker (2 wheeled) Transfers: Sit to/from Stand Sit to Stand: Supervision         General transfer comment: Good eccentric and concentric control to/from [facility]. Requires verbal cues for safe hand placement with RW use    Balance Overall balance assessment: Needs assistance Sitting-balance support: No upper extremity supported;Feet unsupported Sitting balance-Leahy Scale: Good Sitting balance - Comments: Good sitting balance reaching within BOS   Standing balance support: Bilateral upper extremity supported;During functional activity Standing balance-Leahy Scale: Fair Standing balance comment: MIN GUARD for functional mobility of household distances with UE support from RW                           ADL either performed or assessed with clinical judgement   ADL Overall ADL's : Needs assistance/impaired     Grooming: Wash/dry hands;Supervision/safety;Standing                   Toilet Transfer: Supervision/safety;Stand-pivot;BSC;Ambulation;Regular Toilet;RW   Toileting- Clothing Manipulation and Hygiene: Supervision/safety;Sitting/lateral lean       Functional mobility during ADLs: Supervision/safety;Rolling walker (to walk 177ft with RW)        Cognition Arousal/Alertness: Awake/alert Behavior During Therapy: WFL for tasks assessed/performed Overall Cognitive Status: Within Functional Limits for tasks assessed                                 General Comments: A&Ox4. Pleasant and  agreeable throughout        Exercises Other Exercises Other Exercises: Educated caregiver on proper body positioning when providing MIN GUARD-MIN A during functional mobility; caregiver demonstrated good understanding of education provided           Pertinent  Vitals/ Pain       Pain Assessment: No/denies pain         Frequency  Min 1X/week        Progress Toward Goals  OT Goals(current goals can now be found in the care plan section)  Progress towards OT goals: Progressing toward goals  Acute Rehab OT Goals Patient Stated Goal: to go home safely OT Goal Formulation: With patient Time For Goal Achievement: 02/09/21 Potential to Achieve Goals: Good  Plan Discharge plan remains appropriate;Frequency remains appropriate       AM-PAC OT "6 Clicks" Daily Activity     Outcome Measure   Help from another person eating meals?: None Help from another person taking care of personal grooming?: A Little Help from another person toileting, which includes using toliet, bedpan, or urinal?: A Little Help from another person bathing (including washing, rinsing, drying)?: A Little Help from another person to put on and taking off regular upper body clothing?: None Help from another person to put on and taking off regular lower body clothing?: A Little 6 Click Score: 20    End of Session Equipment Utilized During Treatment: Gait belt;Rolling walker  OT Visit Diagnosis: Unsteadiness on feet (R26.81)   Activity Tolerance Patient tolerated treatment well   Patient Left in bed;with call bell/phone within reach;with bed alarm set;with family/visitor present   Nurse Communication Mobility status        Time: 5035-4656 OT Time Calculation (min): 23 min  Charges: OT General Charges $OT Visit: 1 Visit OT Treatments $Self Care/Home Management : 8-22 mins $Therapeutic Activity: 8-22 mins  Fredirick Maudlin, OTR/L [place]

## 2021-01-27 NOTE — TOC Initial Note (Signed)
Transition of Care [facility]) - Initial/Assessment Note    Patient Details  Name: Morgan Nicholson MRN: [id number] Date of Birth: 10/19/1945  Transition of Care [facility]) CM/SW Contact:    Eileen Stanford, LCSW Phone Number: 01/27/2021, 1:58 PM  Clinical Narrative:  CSW confirmed pt will need HH. NO agency preference. Pt's daughter confirmed pt has a shower stool and Rolator at home. CSW ordered a bedside commode and 2 wheeled roller through [employer]--to be delivered at bedside prior to dc.   Referral provided to [facility] and they will service pt.                 Expected Discharge Plan: [place] Barriers to Discharge: No Barriers Identified   Patient Goals and CMS Choice Patient states their goals for this hospitalization and ongoing recovery are:: for pt to go home   Choice offered to / list presented to : Adult Children  Expected Discharge Plan and Services Expected Discharge Plan: [place] In-house Referral: Clinical Social Work   Post Acute Care Choice: [place] arrangements for the past 2 months: [place] Expected Discharge Date: 01/27/21                           [facility] Agency: [place] Date [facility] Agency Contacted: 01/27/21 Time HH Agency Contacted: 38 Representative spoke with at [place]: [facility]  Prior Living Arrangements/Services Living arrangements for the past 2 months: [place] with:: Self Patient language and need for interpreter reviewed:: Yes Do you feel safe going back to the place where you live?: Yes      Need for Family Participation in Patient Care: Yes (Comment) Care giver support system in place?: Yes (comment)   Criminal Activity/Legal Involvement Pertinent to Current Situation/Hospitalization: No - Comment as needed  Activities of Daily Living Home Assistive Devices/Equipment: Walker (specify type) ADL Screening (condition at time of admission) Patient's cognitive ability  adequate to safely complete daily activities?: Yes Is the patient deaf or have difficulty hearing?: No Does the patient have difficulty seeing, even when wearing glasses/contacts?: No Does the patient have difficulty concentrating, remembering, or making decisions?: No Patient able to express need for assistance with ADLs?: Yes Does the patient have difficulty dressing or bathing?: No Independently performs ADLs?: Yes (appropriate for developmental age) Does the patient have difficulty walking or climbing stairs?: Yes Weakness of Legs: Both Weakness of Arms/Hands: None  Permission Sought/Granted Permission sought to share information with : Family Supports    Share Information with NAME: lynette     Permission granted to share info w Relationship: daughter     Emotional Assessment Appearance:: Appears stated age Attitude/Demeanor/Rapport: Engaged   Orientation: : Oriented to Self, Oriented to Place, Oriented to  Time, Oriented to Situation Alcohol / Substance Use: Not Applicable Psych Involvement: No (comment)  Admission diagnosis:  Stroke (cerebrum) ([place]) [I63.9] Acute CVA (cerebrovascular accident) ([place]) [I63.9] Weakness of right lower extremity [R29.898] Cerebrovascular accident (CVA), unspecified mechanism ([place]) [I63.9] Patient Active Problem List   Diagnosis Date Noted   Acute CVA (cerebrovascular accident) ([place]) 01/25/2021   Hyperglycemia due to type 2 diabetes mellitus ([place]) 01/25/2021   Chronic kidney disease, stage 3a ([place]) 01/25/2021   Elevated troponin 01/25/2021   HTN (hypertension), benign 10/13/2013   PCP:  Kirk Ruths, MD Pharmacy:   [facility] [address], [place] - [address] [place] [address] [place]  [place] [address] Phone: [phone] Fax: [phone]  [facility] [place] [address] Phone: [phone] Fax: [phone]  [id number] -  [place], [place] - [address] [place] [address] Phone: [phone] Fax: [phone]     Social Determinants of Health (SDOH) Interventions    Readmission Risk Interventions No flowsheet data found.

## 2021-01-27 NOTE — Discharge Summary (Signed)
Morgan Nicholson WEX:[id number] DOB: [birth year] DOA: 01/24/2021  PCP: Kirk Ruths, MD  Admit date: 01/24/2021 Discharge date: 01/27/2021  Admitted From: home Disposition:  home  Recommendations for Outpatient Follow-up:  Follow up with PCP in 1 week Please obtain BMP/CBC in one week Please follow up with cardiology in 2 weeks for monitor f/u F/u with neurology in one week  Home Health:yes    Discharge Condition:Stable CODE STATUS:full  Diet recommendation: Heart Healthy / Carb Modified  Brief/Interim Summary: Per HPI: Morgan Nicholson is a 75 y.o. female with medical history significant for HTN, DM, CKD 3, HLD who presents with acute onset weakness of bilateral lower extremities right greater than left with difficulty ambulating starting at around 2 PM, arriving to the ED at 7 PM.  She denies headache or visual disturbance or weakness numbness or tingling in the arm or face.  She was previously in her usual state of health and denies any recent illness.On arrival BP 191/95, temp 99.2 with otherwise normal vitals Blood work significant for glucose 292, creatinine 1.13 which is baseline, troponin mildly elevated.  EKG with nonischemic ST changes MRI with small focus of acute ischemia at the base of the left central sulcus.  No hemorrhage or mass-effect.  Chronic microvascular ischemia.  Neurology was consulted.  During her hospitalization her blood glucose levels were elevated from 300s to 400s.  Acute left parietal CVA (cerebrovascular accident) ([place]) -MRI with small focus of acute ischemia at the base of the left central sulcus Neurology was consulted CTA was obtained with results below Goal LDL <70 PT/OT- rec. HH Her blood pressure was gradually decreased.   Monitor was placed prior to her discharge by cardiology to rule out dysrhythmia as cause of her stroke. Neurology recommended ASA 81mg  qd +75mg  QD x3 weeks then aspirin thereafter Continue statins.         Elevated  troponin  Likely demand ischemia 2/2 stroke No cp Troponin trended down ECHo nml EF and No WMA   Hyperglycemia due to type 2 diabetes mellitus (HCC) BG uncontrolled A1c 8.7 Was managed with insulin in the hospital.  It has improved.  She will need to follow-up with her primary care for close monitoring      HTN (hypertension), benign Permissive hypertension..>>>>slowly to normotensive Avoid hypotension      AKI on Chronic kidney disease, stage 3a ([place]) Renal function improved with ivf          Discharge Diagnoses:  Principal Problem:   Acute CVA (cerebrovascular accident) ([place]) Active Problems:   HTN (hypertension), benign   Hyperglycemia due to type 2 diabetes mellitus ([place])   Chronic kidney disease, stage 3a ([place])   Elevated troponin    Discharge Instructions  Discharge Instructions     Call MD for:  persistant dizziness or light-headedness   Complete by: As directed    Diet - low sodium heart healthy   Complete by: As directed    Diet Carb Modified   Complete by: As directed    Discharge instructions   Complete by: As directed    F/u with pcp for your blood pressure , ,may need adjustments. Called neurology to follow-up with as outpatient, phone number [phone]   Increase activity slowly   Complete by: As directed       Allergies as of 01/27/2021   No Known Allergies      Medication List     STOP taking these medications    amLODipine 5 MG tablet Commonly  known as: NORVASC   ibuprofen 200 MG tablet Commonly known as: ADVIL   traMADol 50 MG tablet Commonly known as: Ultram       TAKE these medications    acetaminophen 500 MG tablet Commonly known as: TYLENOL Take 1,000 mg by mouth every 6 (six) hours as needed for mild pain.   aspirin EC 81 MG tablet Take 1 tablet (81 mg total) by mouth every evening. Take it with your plavix What changed: additional instructions   atorvastatin 80 MG tablet Commonly known as: LIPITOR Take  80 mg by mouth every evening.   CALCIUM 600+D3 PO Take 1 tablet by mouth in the morning and at bedtime.   carvedilol 12.5 MG tablet Commonly known as: COREG Take 12.5 mg by mouth 2 (two) times daily.   clopidogrel 75 MG tablet Commonly known as: PLAVIX Take 1 tablet (75 mg total) by mouth daily for 21 days. Start taking on: January 28, 2021   fluocinonide 0.05 % external solution Commonly known as: LIDEX Apply 1 application topically daily as needed (scalp dermatitis).   glimepiride 2 MG tablet Commonly known as: AMARYL Take 2 mg by mouth daily with supper.   insulin NPH-regular Human (70-30) 100 UNIT/ML injection Inject 26 Units into the skin 2 (two) times daily with a meal. With breakfast & with supper   ketoconazole 2 % shampoo Commonly known as: NIZORAL Apply 1 application topically every other day.   lisinopril 40 MG tablet Commonly known as: ZESTRIL Take 40 mg daily by mouth.   metFORMIN 1000 MG tablet Commonly known as: GLUCOPHAGE Take 1,000 mg by mouth 2 (two) times daily with a meal.               Durable Medical Equipment  (From admission, onward)           Start     Ordered   01/27/21 0815  For home use only DME Walker rolling  Once       Question Answer Comment  Walker: With Gillis Wheels   Patient needs a walker to treat with the following condition Stroke (cerebrum) ([place])      01/27/21 0814   01/27/21 0815  For home use only DME Shower stool  Once        01/27/21 8938            Follow-up Information     Kirk Ruths, MD Follow up on 02/03/2021.   Specialty: Internal Medicine Why: @ 4pm Contact information: [address] [facility] [place] [address] [phone]         Vladimir Crofts, MD Follow up in 1 week(s).   Specialty: Neurology Why: @ Contact information: [place] [facility] [place] [address] [phone]         Nelva Bush, MD  Follow up on 02/26/2021.   Specialty: Cardiology Why: @ 8:40am Contact information: [place] [address] [phone]                No Known Allergies  Consultations: Neurology   Procedures/Studies: CT ANGIO HEAD NECK W WO CM  Result Date: 01/25/2021 CLINICAL DATA:  Neuro deficit, acute, stroke suspected EXAM: CT ANGIOGRAPHY HEAD AND NECK TECHNIQUE: Multidetector CT imaging of the head and neck was performed using the standard protocol during bolus administration of intravenous contrast. Multiplanar CT image reconstructions and MIPs were obtained to evaluate the vascular anatomy. Carotid stenosis measurements (  when applicable) are obtained utilizing NASCET criteria, using the distal internal carotid diameter as the denominator. CONTRAST:  65mL OMNIPAQUE IOHEXOL 350 MG/ML SOLN COMPARISON:  MRI and CT 01/24/2021. FINDINGS: CT HEAD FINDINGS Brain: Small acute infarct in the region of the a left central sulcus, better characterized on recent MRI. No evidence of interval acute large vascular territory infarct, acute hemorrhage, mass effect, or midline shift. Vascular: See below. Skull: No acute fracture. Sinuses: Visualized sinuses are largely clear. Orbits: No acute findings in the visualized orbits. Review of the MIP images confirms the above findings CTA NECK FINDINGS Aortic arch: Great vessel origins are patent. Right carotid system: Predominately calcific atherosclerosis at the carotid bifurcation with approximately 55% stenosis of the ICA origin. Left carotid system: Mild-to-moderate stenosis of the common carotid artery due to calcific atherosclerosis. Predominately calcific atherosclerosis at the carotid bifurcation with approximately 45% stenosis Vertebral arteries: Right dominant. Moderate (approximately 40%) stenosis of the right vertebral artery origin. Severe left vertebral artery origin stenosis. Skeleton: Mild for age multilevel degenerative change  of the cervical spine. Osteopenia. Other neck: No acute abnormality. Subcentimeter thyroid nodules. Not clinically significant; no follow-up imaging recommended (ref: J Am Coll Radiol. 2015 Feb;12(2): 143-50). Upper chest: Visualized lung apices are clear. Review of the MIP images confirms the above findings CTA HEAD FINDINGS Anterior circulation: Calcific atherosclerosis of bilateral paraclinoid ICAs with mild (approximately 20%) right paraclinoid ICA stenosis. Bilateral MCAs and ACAs patent without proximal hemodynamically significant stenosis. No aneurysm identified. Posterior circulation: Mild atherosclerosis right intradural vertebral artery. Bilateral intradural vertebral arteries, basilar artery, and posterior cerebral arteries are patent without proximal hemodynamically significant stenosis. No aneurysm identified. Venous sinuses: As permitted by contrast timing, patent. Review of the MIP images confirms the above findings IMPRESSION: CT head: 1. Small acute infarct in the region of the a left central sulcus, better characterized on recent MRI. 2. No evidence of interval acute intracranial abnormality. CTA head: 1. No large vessel occlusion or proximal hemodynamically significant stenosis. 2. Mild right paraclinoid ICA stenosis. CTA neck: 1. Bilateral carotid bifurcation atherosclerosis with approximately 55% right and 45% left proximal ICA stenosis. 2. Severe left and moderate right vertebral artery origin stenosis. Electronically Signed   By: Margaretha Sheffield M.D.   On: 01/25/2021 11:41   CT Head Wo Contrast  Result Date: 01/24/2021 CLINICAL DATA:  Leg weakness and difficulty ambulating EXAM: CT HEAD WITHOUT CONTRAST TECHNIQUE: Contiguous axial images were obtained from the base of the skull through the vertex without intravenous contrast. COMPARISON:  None. FINDINGS: Brain: No evidence of acute infarction, hemorrhage, hydrocephalus, extra-axial collection or mass lesion/mass effect. Vascular: No  hyperdense vessel or unexpected calcification. Skull: Normal. Negative for fracture or focal lesion. Sinuses/Orbits: No acute finding. Other: None. IMPRESSION: No acute intracranial abnormality noted. Electronically Signed   By: Inez Catalina M.D.   On: 01/24/2021 20:11   MR BRAIN WO CONTRAST  Result Date: 01/24/2021 CLINICAL DATA:  Lower extremity weakness EXAM: MRI HEAD WITHOUT CONTRAST TECHNIQUE: Multiplanar, multiecho pulse sequences of the brain and surrounding structures were obtained without intravenous contrast. COMPARISON:  None. FINDINGS: Brain: There is a focus of abnormal diffusion restriction at the base of left central sulcus. No other diffusion abnormality. No acute or chronic hemorrhage. There is multifocal hyperintense T2-weighted signal within the white matter. Parenchymal volume and CSF spaces are normal. The midline structures are normal. Vascular: Major flow voids are preserved. Skull and upper cervical spine: Normal calvarium and skull base. Visualized upper cervical spine and soft tissues  are normal. Sinuses/Orbits:No paranasal sinus fluid levels or advanced mucosal thickening. No mastoid or middle ear effusion. Normal orbits. IMPRESSION: 1. Small focus of acute ischemia at the base of the left central sulcus. No hemorrhage or mass effect. 2. Findings of chronic microvascular ischemia. Electronically Signed   By: Ulyses Jarred M.D.   On: 01/24/2021 23:21   US Carotid Bilateral (at [facility] and [facility] only)  Result Date: 01/25/2021 CLINICAL DATA:  Stroke EXAM: BILATERAL CAROTID DUPLEX ULTRASOUND TECHNIQUE: Pearline Cables scale imaging, color Doppler and duplex ultrasound were performed of bilateral carotid and vertebral arteries in the neck. COMPARISON:  None. FINDINGS: Criteria: Quantification of carotid stenosis is based on velocity parameters that correlate the residual internal carotid diameter with NASCET-based stenosis levels, using the diameter of the distal internal carotid lumen as the  denominator for stenosis measurement. The following velocity measurements were obtained: RIGHT ICA: 122 cm/sec CCA: 85 cm/sec SYSTOLIC ICA/CCA RATIO:  1.4 ECA: 128 cm/sec LEFT ICA: 113 cm/sec CCA: 403 cm/sec SYSTOLIC ICA/CCA RATIO:  1.1 ECA: 95 cm/sec RIGHT CAROTID ARTERY: Mild to moderate amount of calcified plaque at the carotid bifurcation. RIGHT VERTEBRAL ARTERY:  Appropriate antegrade flow. LEFT CAROTID ARTERY: Moderate amount of calcified plaque at the carotid bifurcation and within the proximal LEFT ICA LEFT VERTEBRAL ARTERY:  Appropriate antegrade flow Upper extremity blood pressures: Not provided. IMPRESSION: Moderate amount of calcified plaque at the bilateral carotid artery bifurcations, but no occlusion or evidence of hemodynamically significant stenosis within the bilateral common carotid or internal carotid arteries based on velocity measurements. Electronically Signed   By: Franki Cabot M.D.   On: 01/25/2021 05:41   MM 3D SCREEN BREAST BILATERAL  Result Date: 01/14/2021 CLINICAL DATA:  Screening. EXAM: DIGITAL SCREENING BILATERAL MAMMOGRAM WITH TOMOSYNTHESIS AND CAD TECHNIQUE: Bilateral screening digital craniocaudal and mediolateral oblique mammograms were obtained. Bilateral screening digital breast tomosynthesis was performed. The images were evaluated with computer-aided detection. COMPARISON:  Previous exam(s). ACR Breast Density Category b: There are scattered areas of fibroglandular density. FINDINGS: There are no findings suspicious for malignancy. IMPRESSION: No mammographic evidence of malignancy. A result letter of this screening mammogram will be mailed directly to the patient. RECOMMENDATION: Screening mammogram in one year. (Code:SM-B-01Y) BI-RADS CATEGORY  1: Negative. Electronically Signed   By: Audie Pinto M.D.   On: 01/14/2021 14:11  ECHOCARDIOGRAM COMPLETE  Result Date: 01/25/2021    ECHOCARDIOGRAM REPORT   Patient Name:   Joel A Rufus Date of Exam: 01/25/2021  Medical Rec #:  [id number]    Height:       65.0 in Accession #:    [id number]   Weight:       174.0 lb Date of Birth:  02-Oct-1945    BSA:          1.864 m Patient Age:    62 years     BP:           176/92 mmHg Patient Gender: F            HR:           80 bpm. Exam Location:  [facility] Procedure: 2D Echo, Cardiac Doppler and Color Doppler Indications:     Stroke I63.9  History:         Patient has no prior history of Echocardiogram examinations.                  Risk Factors:Hypertension and Diabetes.  Sonographer:     Alyse Low Roar Referring Phys:  [id number] Ovid  Lucile Shutters Diagnosing Phys: Ida Rogue MD IMPRESSIONS  1. Left ventricular ejection fraction, by estimation, is 60 to 65%. The left ventricle has normal function. The left ventricle has no regional wall motion abnormalities. There is mild left ventricular hypertrophy. Left ventricular diastolic parameters are consistent with Grade I diastolic dysfunction (impaired relaxation).  2. Right ventricular systolic function is normal. The right ventricular size is normal. There is normal pulmonary artery systolic pressure. The estimated right ventricular systolic pressure is 68.3 mmHg.  3. Left atrial size was mildly dilated.  4. The inferior vena cava is normal in size with greater than 50% respiratory variability, suggesting right atrial pressure of 3 mmHg. FINDINGS  Left Ventricle: Left ventricular ejection fraction, by estimation, is 60 to 65%. The left ventricle has normal function. The left ventricle has no regional wall motion abnormalities. The left ventricular internal cavity size was normal in size. There is  mild left ventricular hypertrophy. Left ventricular diastolic parameters are consistent with Grade I diastolic dysfunction (impaired relaxation). Right Ventricle: The right ventricular size is normal. No increase in right ventricular wall thickness. Right ventricular systolic function is normal. There is normal pulmonary artery systolic pressure. The  tricuspid regurgitant velocity is 2.59 m/s, and  with an assumed right atrial pressure of 5 mmHg, the estimated right ventricular systolic pressure is 41.9 mmHg. Left Atrium: Left atrial size was mildly dilated. Right Atrium: Right atrial size was normal in size. Pericardium: There is no evidence of pericardial effusion. Mitral Valve: The mitral valve is normal in structure. Mild mitral annular calcification. No evidence of mitral valve regurgitation. No evidence of mitral valve stenosis. Tricuspid Valve: The tricuspid valve is normal in structure. Tricuspid valve regurgitation is mild . No evidence of tricuspid stenosis. Aortic Valve: The aortic valve was not well visualized. Aortic valve regurgitation is not visualized. No aortic stenosis is present. Aortic valve peak gradient measures 13.8 mmHg. Pulmonic Valve: The pulmonic valve was normal in structure. Pulmonic valve regurgitation is not visualized. No evidence of pulmonic stenosis. Aorta: The aortic root is normal in size and structure. Venous: The inferior vena cava is normal in size with greater than 50% respiratory variability, suggesting right atrial pressure of 3 mmHg. IAS/Shunts: No atrial level shunt detected by color flow Doppler.  LEFT VENTRICLE PLAX 2D LVIDd:         4.12 cm   Diastology LVIDs:         2.67 cm   LV e' medial:    5.66 cm/s LV PW:         1.04 cm   LV E/e' medial:  12.4 LV IVS:        1.22 cm   LV e' lateral:   8.70 cm/s LVOT diam:     1.80 cm   LV E/e' lateral: 8.1 LVOT Area:     2.54 cm  RIGHT VENTRICLE RV Basal diam:  2.59 cm RV Mid diam:    2.37 cm RV S prime:     12.90 cm/s TAPSE (M-mode): 1.5 cm LEFT ATRIUM             Index        RIGHT ATRIUM           Index LA diam:        3.90 cm 2.09 cm/m   RA Area:     10.80 cm LA Vol (A2C):   51.2 ml 27.46 ml/m  RA Volume:   20.10 ml  10.78 ml/m LA Vol (A4C):  51.9 ml 27.84 ml/m LA Biplane Vol: 51.8 ml 27.78 ml/m  AORTIC VALVE                 PULMONIC VALVE AV Area (Vmax): 1.78 cm      PV Vmax:        1.07 m/s AV Vmax:        185.50 cm/s  PV Peak grad:   4.6 mmHg AV Peak Grad:   13.8 mmHg    RVOT Peak grad: 4 mmHg LVOT Vmax:      130.00 cm/s  AORTA Ao Root diam: 3.10 cm MITRAL VALVE                TRICUSPID VALVE MV Area (PHT): 3.83 cm     TR Peak grad:   26.8 mmHg MV Decel Time: 198 msec     TR Vmax:        259.00 cm/s MV E velocity: 70.30 cm/s MV A velocity: 116.00 cm/s  SHUNTS MV E/A ratio:  0.61         Systemic Diam: 1.80 cm MV A Prime:    12.5 cm/s Ida Rogue MD Electronically signed by Ida Rogue MD Signature Date/Time: 01/25/2021/1:35:54 PM    Final       Subjective: No chest pain, dizziness, shortness of breath  Discharge Exam: Vitals:   01/27/21 0722 01/27/21 1138  BP: (!) 161/75 133/86  Pulse: 72 75  Resp: 18 18  Temp: 97.6 F (36.4 C) 98.1 F (36.7 C)  SpO2: 97% 95%   Vitals:   01/26/21 2347 01/27/21 0444 01/27/21 0722 01/27/21 1138  BP: 137/74 (!) 165/90 (!) 161/75 133/86  Pulse: 85 70 72 75  Resp: 18 17 18 18   Temp:   97.6 F (36.4 C) 98.1 F (36.7 C)  TempSrc:   Oral Oral  SpO2: 95% 97% 97% 95%  Weight:      Height:        General: Pt is alert, awake, not in acute distress Cardiovascular: RRR, S1/S2 +, no rubs, no gallops Respiratory: CTA bilaterally, no wheezing, no rhonchi Abdominal: Soft, NT, ND, bowel sounds + Extremities: no edema, no cyanosis    The results of significant diagnostics from this hospitalization (including imaging, microbiology, ancillary and laboratory) are listed below for reference.     Microbiology: Recent Results (from the past 240 hour(s))  Resp Panel by RT-PCR (Flu A&B, Covid) Nasopharyngeal Swab     Status: None   Collection Time: 01/24/21  8:21 PM   Specimen: Nasopharyngeal Swab; Nasopharyngeal(NP) swabs in vial transport medium  Result Value Ref Range Status   SARS Coronavirus 2 by RT PCR NEGATIVE NEGATIVE Final    Comment: (NOTE) SARS-CoV-2 target nucleic acids are NOT DETECTED.  The  SARS-CoV-2 RNA is generally detectable in upper respiratory specimens during the acute phase of infection. The lowest concentration of SARS-CoV-2 viral copies this assay can detect is 138 copies/mL. A negative result does not preclude SARS-Cov-2 infection and should not be used as the sole basis for treatment or other patient management decisions. A negative result may occur with  improper specimen collection/handling, submission of specimen other than nasopharyngeal swab, presence of viral mutation(s) within the areas targeted by this assay, and inadequate number of viral copies(<138 copies/mL). A negative result must be combined with clinical observations, patient history, and epidemiological information. The expected result is Negative.  Fact Sheet for Patients:  EntrepreneurPulse.com.au  Fact Sheet for Healthcare Providers:  IncredibleEmployment.be  This test is no t  yet approved or cleared by the [place] and  has been authorized for detection and/or diagnosis of SARS-CoV-2 by FDA under an Emergency Use Authorization (EUA). This EUA will remain  in effect (meaning this test can be used) for the duration of the COVID-19 declaration under Section 564(b)(1) of the Act, 21 U.S.C.section 360bbb-3(b)(1), unless the authorization is terminated  or revoked sooner.       Influenza A by PCR NEGATIVE NEGATIVE Final   Influenza B by PCR NEGATIVE NEGATIVE Final    Comment: (NOTE) The Xpert Xpress SARS-CoV-2/FLU/RSV plus assay is intended as an aid in the diagnosis of influenza from Nasopharyngeal swab specimens and should not be used as a sole basis for treatment. Nasal washings and aspirates are unacceptable for Xpert Xpress SARS-CoV-2/FLU/RSV testing.  Fact Sheet for Patients: EntrepreneurPulse.com.au  Fact Sheet for Healthcare Providers: IncredibleEmployment.be  This test is not yet approved or  cleared by the [place] FDA and has been authorized for detection and/or diagnosis of SARS-CoV-2 by FDA under an Emergency Use Authorization (EUA). This EUA will remain in effect (meaning this test can be used) for the duration of the COVID-19 declaration under Section 564(b)(1) of the Act, 21 U.S.C. section 360bbb-3(b)(1), unless the authorization is terminated or revoked.  Performed at [facility], [address] [place], [place] [address]      Labs: BNP (last 3 results) No results for input(s): BNP in the last 8760 hours. Basic Metabolic Panel: Recent Labs  Lab 01/24/21 1606 01/25/21 0654 01/26/21 1255  NA 135  --   --   K 4.2  --   --   CL 102  --   --   CO2 21*  --   --   GLUCOSE 292*  --  450*  BUN 21  --   --   CREATININE 1.13* 0.98  --   CALCIUM 9.6  --   --   MG 1.7  --   --    Liver Function Tests: No results for input(s): AST, ALT, ALKPHOS, BILITOT, PROT, ALBUMIN in the last 168 hours. No results for input(s): LIPASE, AMYLASE in the last 168 hours. No results for input(s): AMMONIA in the last 168 hours. CBC: Recent Labs  Lab 01/24/21 1606 01/25/21 0654 01/26/21 0944  WBC 10.6* 11.3* 11.1*  HGB 13.4 13.2 13.5  HCT 38.9 39.2 38.5  MCV 90.3 90.1 89.7  PLT 247 244 253   Cardiac Enzymes: No results for input(s): CKTOTAL, CKMB, CKMBINDEX, TROPONINI in the last 168 hours. BNP: Invalid input(s): POCBNP CBG: Recent Labs  Lab 01/26/21 1411 01/26/21 1628 01/26/21 2205 01/27/21 0724 01/27/21 1141  GLUCAP 421* 294* 261* 236* 279*   D-Dimer No results for input(s): DDIMER in the last 72 hours. Hgb A1c Recent Labs    01/25/21 0654  HGBA1C 8.7*   Lipid Profile Recent Labs    01/25/21 0654  CHOL 122  HDL 42  LDLCALC 44  TRIG 181*  CHOLHDL 2.9   Thyroid function studies Recent Labs    01/24/21 1606  TSH 2.754   Anemia work up No results for input(s): VITAMINB12, FOLATE, FERRITIN, TIBC, IRON, RETICCTPCT in the last 72  hours. Urinalysis    Component Value Date/Time   COLORURINE STRAW (A) 01/24/2021 1606   APPEARANCEUR CLEAR (A) 01/24/2021 1606   LABSPEC 1.016 01/24/2021 1606   PHURINE 5.0 01/24/2021 1606   GLUCOSEU >=500 (A) 01/24/2021 1606   HGBUR NEGATIVE 01/24/2021 [place] 01/24/2021 1606  KETONESUR NEGATIVE 01/24/2021 [place] 01/24/2021 1606   NITRITE NEGATIVE 01/24/2021 1606   LEUKOCYTESUR NEGATIVE 01/24/2021 1606   Sepsis Labs Invalid input(s): PROCALCITONIN,  WBC,  LACTICIDVEN Microbiology Recent Results (from the past 240 hour(s))  Resp Panel by RT-PCR (Flu A&B, Covid) Nasopharyngeal Swab     Status: None   Collection Time: 01/24/21  8:21 PM   Specimen: Nasopharyngeal Swab; Nasopharyngeal(NP) swabs in vial transport medium  Result Value Ref Range Status   SARS Coronavirus 2 by RT PCR NEGATIVE NEGATIVE Final    Comment: (NOTE) SARS-CoV-2 target nucleic acids are NOT DETECTED.  The SARS-CoV-2 RNA is generally detectable in upper respiratory specimens during the acute phase of infection. The lowest concentration of SARS-CoV-2 viral copies this assay can detect is 138 copies/mL. A negative result does not preclude SARS-Cov-2 infection and should not be used as the sole basis for treatment or other patient management decisions. A negative result may occur with  improper specimen collection/handling, submission of specimen other than nasopharyngeal swab, presence of viral mutation(s) within the areas targeted by this assay, and inadequate number of viral copies(<138 copies/mL). A negative result must be combined with clinical observations, patient history, and epidemiological information. The expected result is Negative.  Fact Sheet for Patients:  EntrepreneurPulse.com.au  Fact Sheet for Healthcare Providers:  IncredibleEmployment.be  This test is no t yet approved or cleared by the [place] FDA and  has  been authorized for detection and/or diagnosis of SARS-CoV-2 by FDA under an Emergency Use Authorization (EUA). This EUA will remain  in effect (meaning this test can be used) for the duration of the COVID-19 declaration under Section 564(b)(1) of the Act, 21 U.S.C.section 360bbb-3(b)(1), unless the authorization is terminated  or revoked sooner.       Influenza A by PCR NEGATIVE NEGATIVE Final   Influenza B by PCR NEGATIVE NEGATIVE Final    Comment: (NOTE) The Xpert Xpress SARS-CoV-2/FLU/RSV plus assay is intended as an aid in the diagnosis of influenza from Nasopharyngeal swab specimens and should not be used as a sole basis for treatment. Nasal washings and aspirates are unacceptable for Xpert Xpress SARS-CoV-2/FLU/RSV testing.  Fact Sheet for Patients: EntrepreneurPulse.com.au  Fact Sheet for Healthcare Providers: IncredibleEmployment.be  This test is not yet approved or cleared by the [place] FDA and has been authorized for detection and/or diagnosis of SARS-CoV-2 by FDA under an Emergency Use Authorization (EUA). This EUA will remain in effect (meaning this test can be used) for the duration of the COVID-19 declaration under Section 564(b)(1) of the Act, 21 U.S.C. section 360bbb-3(b)(1), unless the authorization is terminated or revoked.  Performed at [facility], [address], [place], [place] [address]      Time coordinating discharge: Over 30 minutes  SIGNED:   Nolberto Hanlon, MD  [facility] 01/27/2021, 1:09 PM Pager   If 7PM-7AM, please contact night-coverage [email or web address] Password [email or web address]

## 2021-01-27 NOTE — Progress Notes (Signed)
Per Dr. Saunders Revel, please have the Zio order assigned to neurology, and the reading MD for the monitor can be Dr. Saunders Revel

## 2021-01-28 DIAGNOSIS — I639 Cerebral infarction, unspecified: Secondary | ICD-10-CM | POA: Diagnosis not present

## 2021-01-30 DIAGNOSIS — R29898 Other symptoms and signs involving the musculoskeletal system: Secondary | ICD-10-CM | POA: Diagnosis not present

## 2021-01-30 DIAGNOSIS — E1122 Type 2 diabetes mellitus with diabetic chronic kidney disease: Secondary | ICD-10-CM | POA: Diagnosis not present

## 2021-01-30 DIAGNOSIS — I081 Rheumatic disorders of both mitral and tricuspid valves: Secondary | ICD-10-CM | POA: Diagnosis not present

## 2021-01-30 DIAGNOSIS — M160 Bilateral primary osteoarthritis of hip: Secondary | ICD-10-CM | POA: Diagnosis not present

## 2021-01-30 DIAGNOSIS — N1831 Chronic kidney disease, stage 3a: Secondary | ICD-10-CM | POA: Diagnosis not present

## 2021-01-30 DIAGNOSIS — I69398 Other sequelae of cerebral infarction: Secondary | ICD-10-CM | POA: Diagnosis not present

## 2021-01-30 DIAGNOSIS — I1310 Hypertensive heart and chronic kidney disease without heart failure, with stage 1 through stage 4 chronic kidney disease, or unspecified chronic kidney disease: Secondary | ICD-10-CM | POA: Diagnosis not present

## 2021-01-30 DIAGNOSIS — M47812 Spondylosis without myelopathy or radiculopathy, cervical region: Secondary | ICD-10-CM | POA: Diagnosis not present

## 2021-01-30 DIAGNOSIS — M8580 Other specified disorders of bone density and structure, unspecified site: Secondary | ICD-10-CM | POA: Diagnosis not present

## 2021-02-02 DIAGNOSIS — N1831 Chronic kidney disease, stage 3a: Secondary | ICD-10-CM | POA: Diagnosis not present

## 2021-02-02 DIAGNOSIS — I1310 Hypertensive heart and chronic kidney disease without heart failure, with stage 1 through stage 4 chronic kidney disease, or unspecified chronic kidney disease: Secondary | ICD-10-CM | POA: Diagnosis not present

## 2021-02-02 DIAGNOSIS — M8580 Other specified disorders of bone density and structure, unspecified site: Secondary | ICD-10-CM | POA: Diagnosis not present

## 2021-02-02 DIAGNOSIS — M47812 Spondylosis without myelopathy or radiculopathy, cervical region: Secondary | ICD-10-CM | POA: Diagnosis not present

## 2021-02-02 DIAGNOSIS — E1122 Type 2 diabetes mellitus with diabetic chronic kidney disease: Secondary | ICD-10-CM | POA: Diagnosis not present

## 2021-02-02 DIAGNOSIS — M160 Bilateral primary osteoarthritis of hip: Secondary | ICD-10-CM | POA: Diagnosis not present

## 2021-02-02 DIAGNOSIS — I081 Rheumatic disorders of both mitral and tricuspid valves: Secondary | ICD-10-CM | POA: Diagnosis not present

## 2021-02-02 DIAGNOSIS — I69398 Other sequelae of cerebral infarction: Secondary | ICD-10-CM | POA: Diagnosis not present

## 2021-02-02 DIAGNOSIS — R29898 Other symptoms and signs involving the musculoskeletal system: Secondary | ICD-10-CM | POA: Diagnosis not present

## 2021-02-03 DIAGNOSIS — N1831 Chronic kidney disease, stage 3a: Secondary | ICD-10-CM | POA: Diagnosis not present

## 2021-02-03 DIAGNOSIS — N182 Chronic kidney disease, stage 2 (mild): Secondary | ICD-10-CM | POA: Diagnosis not present

## 2021-02-03 DIAGNOSIS — M47812 Spondylosis without myelopathy or radiculopathy, cervical region: Secondary | ICD-10-CM | POA: Diagnosis not present

## 2021-02-03 DIAGNOSIS — R29898 Other symptoms and signs involving the musculoskeletal system: Secondary | ICD-10-CM | POA: Diagnosis not present

## 2021-02-03 DIAGNOSIS — I779 Disorder of arteries and arterioles, unspecified: Secondary | ICD-10-CM | POA: Insufficient documentation

## 2021-02-03 DIAGNOSIS — M160 Bilateral primary osteoarthritis of hip: Secondary | ICD-10-CM | POA: Diagnosis not present

## 2021-02-03 DIAGNOSIS — I081 Rheumatic disorders of both mitral and tricuspid valves: Secondary | ICD-10-CM | POA: Diagnosis not present

## 2021-02-03 DIAGNOSIS — E1122 Type 2 diabetes mellitus with diabetic chronic kidney disease: Secondary | ICD-10-CM | POA: Diagnosis not present

## 2021-02-03 DIAGNOSIS — I69398 Other sequelae of cerebral infarction: Secondary | ICD-10-CM | POA: Diagnosis not present

## 2021-02-03 DIAGNOSIS — I1310 Hypertensive heart and chronic kidney disease without heart failure, with stage 1 through stage 4 chronic kidney disease, or unspecified chronic kidney disease: Secondary | ICD-10-CM | POA: Diagnosis not present

## 2021-02-03 DIAGNOSIS — Z794 Long term (current) use of insulin: Secondary | ICD-10-CM | POA: Diagnosis not present

## 2021-02-03 DIAGNOSIS — M8580 Other specified disorders of bone density and structure, unspecified site: Secondary | ICD-10-CM | POA: Diagnosis not present

## 2021-02-04 DIAGNOSIS — R29898 Other symptoms and signs involving the musculoskeletal system: Secondary | ICD-10-CM | POA: Diagnosis not present

## 2021-02-04 DIAGNOSIS — I69398 Other sequelae of cerebral infarction: Secondary | ICD-10-CM | POA: Diagnosis not present

## 2021-02-04 DIAGNOSIS — M47812 Spondylosis without myelopathy or radiculopathy, cervical region: Secondary | ICD-10-CM | POA: Diagnosis not present

## 2021-02-04 DIAGNOSIS — M160 Bilateral primary osteoarthritis of hip: Secondary | ICD-10-CM | POA: Diagnosis not present

## 2021-02-04 DIAGNOSIS — M8580 Other specified disorders of bone density and structure, unspecified site: Secondary | ICD-10-CM | POA: Diagnosis not present

## 2021-02-04 DIAGNOSIS — E1122 Type 2 diabetes mellitus with diabetic chronic kidney disease: Secondary | ICD-10-CM | POA: Diagnosis not present

## 2021-02-04 DIAGNOSIS — I1310 Hypertensive heart and chronic kidney disease without heart failure, with stage 1 through stage 4 chronic kidney disease, or unspecified chronic kidney disease: Secondary | ICD-10-CM | POA: Diagnosis not present

## 2021-02-04 DIAGNOSIS — I081 Rheumatic disorders of both mitral and tricuspid valves: Secondary | ICD-10-CM | POA: Diagnosis not present

## 2021-02-04 DIAGNOSIS — N1831 Chronic kidney disease, stage 3a: Secondary | ICD-10-CM | POA: Diagnosis not present

## 2021-02-05 DIAGNOSIS — M47812 Spondylosis without myelopathy or radiculopathy, cervical region: Secondary | ICD-10-CM | POA: Diagnosis not present

## 2021-02-05 DIAGNOSIS — I1310 Hypertensive heart and chronic kidney disease without heart failure, with stage 1 through stage 4 chronic kidney disease, or unspecified chronic kidney disease: Secondary | ICD-10-CM | POA: Diagnosis not present

## 2021-02-05 DIAGNOSIS — M160 Bilateral primary osteoarthritis of hip: Secondary | ICD-10-CM | POA: Diagnosis not present

## 2021-02-05 DIAGNOSIS — M8580 Other specified disorders of bone density and structure, unspecified site: Secondary | ICD-10-CM | POA: Diagnosis not present

## 2021-02-05 DIAGNOSIS — I69398 Other sequelae of cerebral infarction: Secondary | ICD-10-CM | POA: Diagnosis not present

## 2021-02-05 DIAGNOSIS — I081 Rheumatic disorders of both mitral and tricuspid valves: Secondary | ICD-10-CM | POA: Diagnosis not present

## 2021-02-05 DIAGNOSIS — R29898 Other symptoms and signs involving the musculoskeletal system: Secondary | ICD-10-CM | POA: Diagnosis not present

## 2021-02-05 DIAGNOSIS — N1831 Chronic kidney disease, stage 3a: Secondary | ICD-10-CM | POA: Diagnosis not present

## 2021-02-05 DIAGNOSIS — E1122 Type 2 diabetes mellitus with diabetic chronic kidney disease: Secondary | ICD-10-CM | POA: Diagnosis not present

## 2021-02-06 DIAGNOSIS — M160 Bilateral primary osteoarthritis of hip: Secondary | ICD-10-CM | POA: Diagnosis not present

## 2021-02-06 DIAGNOSIS — Z8673 Personal history of transient ischemic attack (TIA), and cerebral infarction without residual deficits: Secondary | ICD-10-CM | POA: Diagnosis not present

## 2021-02-06 DIAGNOSIS — I081 Rheumatic disorders of both mitral and tricuspid valves: Secondary | ICD-10-CM | POA: Diagnosis not present

## 2021-02-06 DIAGNOSIS — R29898 Other symptoms and signs involving the musculoskeletal system: Secondary | ICD-10-CM | POA: Diagnosis not present

## 2021-02-06 DIAGNOSIS — I1310 Hypertensive heart and chronic kidney disease without heart failure, with stage 1 through stage 4 chronic kidney disease, or unspecified chronic kidney disease: Secondary | ICD-10-CM | POA: Diagnosis not present

## 2021-02-06 DIAGNOSIS — I69398 Other sequelae of cerebral infarction: Secondary | ICD-10-CM | POA: Diagnosis not present

## 2021-02-06 DIAGNOSIS — N1831 Chronic kidney disease, stage 3a: Secondary | ICD-10-CM | POA: Diagnosis not present

## 2021-02-06 DIAGNOSIS — G4733 Obstructive sleep apnea (adult) (pediatric): Secondary | ICD-10-CM | POA: Diagnosis not present

## 2021-02-06 DIAGNOSIS — E1122 Type 2 diabetes mellitus with diabetic chronic kidney disease: Secondary | ICD-10-CM | POA: Diagnosis not present

## 2021-02-09 DIAGNOSIS — M8580 Other specified disorders of bone density and structure, unspecified site: Secondary | ICD-10-CM | POA: Diagnosis not present

## 2021-02-09 DIAGNOSIS — I69398 Other sequelae of cerebral infarction: Secondary | ICD-10-CM | POA: Diagnosis not present

## 2021-02-09 DIAGNOSIS — N1831 Chronic kidney disease, stage 3a: Secondary | ICD-10-CM | POA: Diagnosis not present

## 2021-02-09 DIAGNOSIS — I1310 Hypertensive heart and chronic kidney disease without heart failure, with stage 1 through stage 4 chronic kidney disease, or unspecified chronic kidney disease: Secondary | ICD-10-CM | POA: Diagnosis not present

## 2021-02-09 DIAGNOSIS — I081 Rheumatic disorders of both mitral and tricuspid valves: Secondary | ICD-10-CM | POA: Diagnosis not present

## 2021-02-09 DIAGNOSIS — M47812 Spondylosis without myelopathy or radiculopathy, cervical region: Secondary | ICD-10-CM | POA: Diagnosis not present

## 2021-02-09 DIAGNOSIS — M160 Bilateral primary osteoarthritis of hip: Secondary | ICD-10-CM | POA: Diagnosis not present

## 2021-02-09 DIAGNOSIS — R29898 Other symptoms and signs involving the musculoskeletal system: Secondary | ICD-10-CM | POA: Diagnosis not present

## 2021-02-09 DIAGNOSIS — E1122 Type 2 diabetes mellitus with diabetic chronic kidney disease: Secondary | ICD-10-CM | POA: Diagnosis not present

## 2021-02-10 DIAGNOSIS — M8580 Other specified disorders of bone density and structure, unspecified site: Secondary | ICD-10-CM | POA: Diagnosis not present

## 2021-02-10 DIAGNOSIS — E1122 Type 2 diabetes mellitus with diabetic chronic kidney disease: Secondary | ICD-10-CM | POA: Diagnosis not present

## 2021-02-10 DIAGNOSIS — M47812 Spondylosis without myelopathy or radiculopathy, cervical region: Secondary | ICD-10-CM | POA: Diagnosis not present

## 2021-02-10 DIAGNOSIS — R29898 Other symptoms and signs involving the musculoskeletal system: Secondary | ICD-10-CM | POA: Diagnosis not present

## 2021-02-10 DIAGNOSIS — M160 Bilateral primary osteoarthritis of hip: Secondary | ICD-10-CM | POA: Diagnosis not present

## 2021-02-10 DIAGNOSIS — N1831 Chronic kidney disease, stage 3a: Secondary | ICD-10-CM | POA: Diagnosis not present

## 2021-02-10 DIAGNOSIS — I081 Rheumatic disorders of both mitral and tricuspid valves: Secondary | ICD-10-CM | POA: Diagnosis not present

## 2021-02-10 DIAGNOSIS — I1310 Hypertensive heart and chronic kidney disease without heart failure, with stage 1 through stage 4 chronic kidney disease, or unspecified chronic kidney disease: Secondary | ICD-10-CM | POA: Diagnosis not present

## 2021-02-10 DIAGNOSIS — I69398 Other sequelae of cerebral infarction: Secondary | ICD-10-CM | POA: Diagnosis not present

## 2021-02-12 DIAGNOSIS — E1122 Type 2 diabetes mellitus with diabetic chronic kidney disease: Secondary | ICD-10-CM | POA: Diagnosis not present

## 2021-02-12 DIAGNOSIS — M160 Bilateral primary osteoarthritis of hip: Secondary | ICD-10-CM | POA: Diagnosis not present

## 2021-02-12 DIAGNOSIS — N1831 Chronic kidney disease, stage 3a: Secondary | ICD-10-CM | POA: Diagnosis not present

## 2021-02-12 DIAGNOSIS — I1310 Hypertensive heart and chronic kidney disease without heart failure, with stage 1 through stage 4 chronic kidney disease, or unspecified chronic kidney disease: Secondary | ICD-10-CM | POA: Diagnosis not present

## 2021-02-12 DIAGNOSIS — I69398 Other sequelae of cerebral infarction: Secondary | ICD-10-CM | POA: Diagnosis not present

## 2021-02-12 DIAGNOSIS — M47812 Spondylosis without myelopathy or radiculopathy, cervical region: Secondary | ICD-10-CM | POA: Diagnosis not present

## 2021-02-12 DIAGNOSIS — R29898 Other symptoms and signs involving the musculoskeletal system: Secondary | ICD-10-CM | POA: Diagnosis not present

## 2021-02-12 DIAGNOSIS — M8580 Other specified disorders of bone density and structure, unspecified site: Secondary | ICD-10-CM | POA: Diagnosis not present

## 2021-02-12 DIAGNOSIS — I081 Rheumatic disorders of both mitral and tricuspid valves: Secondary | ICD-10-CM | POA: Diagnosis not present

## 2021-02-16 DIAGNOSIS — Z794 Long term (current) use of insulin: Secondary | ICD-10-CM | POA: Diagnosis not present

## 2021-02-16 DIAGNOSIS — I63412 Cerebral infarction due to embolism of left middle cerebral artery: Secondary | ICD-10-CM | POA: Diagnosis not present

## 2021-02-16 DIAGNOSIS — I779 Disorder of arteries and arterioles, unspecified: Secondary | ICD-10-CM | POA: Diagnosis not present

## 2021-02-16 DIAGNOSIS — I10 Essential (primary) hypertension: Secondary | ICD-10-CM | POA: Diagnosis not present

## 2021-02-16 DIAGNOSIS — E1122 Type 2 diabetes mellitus with diabetic chronic kidney disease: Secondary | ICD-10-CM | POA: Diagnosis not present

## 2021-02-16 DIAGNOSIS — E1169 Type 2 diabetes mellitus with other specified complication: Secondary | ICD-10-CM | POA: Diagnosis not present

## 2021-02-16 DIAGNOSIS — E785 Hyperlipidemia, unspecified: Secondary | ICD-10-CM | POA: Diagnosis not present

## 2021-02-16 DIAGNOSIS — N182 Chronic kidney disease, stage 2 (mild): Secondary | ICD-10-CM | POA: Diagnosis not present

## 2021-02-17 DIAGNOSIS — M47812 Spondylosis without myelopathy or radiculopathy, cervical region: Secondary | ICD-10-CM | POA: Diagnosis not present

## 2021-02-17 DIAGNOSIS — M8580 Other specified disorders of bone density and structure, unspecified site: Secondary | ICD-10-CM | POA: Diagnosis not present

## 2021-02-17 DIAGNOSIS — E1122 Type 2 diabetes mellitus with diabetic chronic kidney disease: Secondary | ICD-10-CM | POA: Diagnosis not present

## 2021-02-17 DIAGNOSIS — N1831 Chronic kidney disease, stage 3a: Secondary | ICD-10-CM | POA: Diagnosis not present

## 2021-02-17 DIAGNOSIS — I69398 Other sequelae of cerebral infarction: Secondary | ICD-10-CM | POA: Diagnosis not present

## 2021-02-17 DIAGNOSIS — I1310 Hypertensive heart and chronic kidney disease without heart failure, with stage 1 through stage 4 chronic kidney disease, or unspecified chronic kidney disease: Secondary | ICD-10-CM | POA: Diagnosis not present

## 2021-02-17 DIAGNOSIS — R29898 Other symptoms and signs involving the musculoskeletal system: Secondary | ICD-10-CM | POA: Diagnosis not present

## 2021-02-17 DIAGNOSIS — M160 Bilateral primary osteoarthritis of hip: Secondary | ICD-10-CM | POA: Diagnosis not present

## 2021-02-17 DIAGNOSIS — I081 Rheumatic disorders of both mitral and tricuspid valves: Secondary | ICD-10-CM | POA: Diagnosis not present

## 2021-02-18 DIAGNOSIS — I69398 Other sequelae of cerebral infarction: Secondary | ICD-10-CM | POA: Diagnosis not present

## 2021-02-18 DIAGNOSIS — I081 Rheumatic disorders of both mitral and tricuspid valves: Secondary | ICD-10-CM | POA: Diagnosis not present

## 2021-02-18 DIAGNOSIS — E1122 Type 2 diabetes mellitus with diabetic chronic kidney disease: Secondary | ICD-10-CM | POA: Diagnosis not present

## 2021-02-18 DIAGNOSIS — M160 Bilateral primary osteoarthritis of hip: Secondary | ICD-10-CM | POA: Diagnosis not present

## 2021-02-18 DIAGNOSIS — R29898 Other symptoms and signs involving the musculoskeletal system: Secondary | ICD-10-CM | POA: Diagnosis not present

## 2021-02-18 DIAGNOSIS — I1310 Hypertensive heart and chronic kidney disease without heart failure, with stage 1 through stage 4 chronic kidney disease, or unspecified chronic kidney disease: Secondary | ICD-10-CM | POA: Diagnosis not present

## 2021-02-18 DIAGNOSIS — N1831 Chronic kidney disease, stage 3a: Secondary | ICD-10-CM | POA: Diagnosis not present

## 2021-02-19 DIAGNOSIS — I69398 Other sequelae of cerebral infarction: Secondary | ICD-10-CM | POA: Diagnosis not present

## 2021-02-19 DIAGNOSIS — M160 Bilateral primary osteoarthritis of hip: Secondary | ICD-10-CM | POA: Diagnosis not present

## 2021-02-19 DIAGNOSIS — I1310 Hypertensive heart and chronic kidney disease without heart failure, with stage 1 through stage 4 chronic kidney disease, or unspecified chronic kidney disease: Secondary | ICD-10-CM | POA: Diagnosis not present

## 2021-02-19 DIAGNOSIS — N1831 Chronic kidney disease, stage 3a: Secondary | ICD-10-CM | POA: Diagnosis not present

## 2021-02-19 DIAGNOSIS — I081 Rheumatic disorders of both mitral and tricuspid valves: Secondary | ICD-10-CM | POA: Diagnosis not present

## 2021-02-19 DIAGNOSIS — M8580 Other specified disorders of bone density and structure, unspecified site: Secondary | ICD-10-CM | POA: Diagnosis not present

## 2021-02-19 DIAGNOSIS — R29898 Other symptoms and signs involving the musculoskeletal system: Secondary | ICD-10-CM | POA: Diagnosis not present

## 2021-02-19 DIAGNOSIS — M47812 Spondylosis without myelopathy or radiculopathy, cervical region: Secondary | ICD-10-CM | POA: Diagnosis not present

## 2021-02-19 DIAGNOSIS — E1122 Type 2 diabetes mellitus with diabetic chronic kidney disease: Secondary | ICD-10-CM | POA: Diagnosis not present

## 2021-02-24 DIAGNOSIS — I1310 Hypertensive heart and chronic kidney disease without heart failure, with stage 1 through stage 4 chronic kidney disease, or unspecified chronic kidney disease: Secondary | ICD-10-CM | POA: Diagnosis not present

## 2021-02-24 DIAGNOSIS — M8580 Other specified disorders of bone density and structure, unspecified site: Secondary | ICD-10-CM | POA: Diagnosis not present

## 2021-02-24 DIAGNOSIS — I69398 Other sequelae of cerebral infarction: Secondary | ICD-10-CM | POA: Diagnosis not present

## 2021-02-24 DIAGNOSIS — M47812 Spondylosis without myelopathy or radiculopathy, cervical region: Secondary | ICD-10-CM | POA: Diagnosis not present

## 2021-02-24 DIAGNOSIS — N1831 Chronic kidney disease, stage 3a: Secondary | ICD-10-CM | POA: Diagnosis not present

## 2021-02-24 DIAGNOSIS — R29898 Other symptoms and signs involving the musculoskeletal system: Secondary | ICD-10-CM | POA: Diagnosis not present

## 2021-02-24 DIAGNOSIS — I081 Rheumatic disorders of both mitral and tricuspid valves: Secondary | ICD-10-CM | POA: Diagnosis not present

## 2021-02-24 DIAGNOSIS — M160 Bilateral primary osteoarthritis of hip: Secondary | ICD-10-CM | POA: Diagnosis not present

## 2021-02-24 DIAGNOSIS — E1122 Type 2 diabetes mellitus with diabetic chronic kidney disease: Secondary | ICD-10-CM | POA: Diagnosis not present

## 2021-02-26 ENCOUNTER — Ambulatory Visit: Payer: Medicare HMO | Admitting: Internal Medicine

## 2021-02-26 DIAGNOSIS — I081 Rheumatic disorders of both mitral and tricuspid valves: Secondary | ICD-10-CM | POA: Diagnosis not present

## 2021-02-26 DIAGNOSIS — R29898 Other symptoms and signs involving the musculoskeletal system: Secondary | ICD-10-CM | POA: Diagnosis not present

## 2021-02-26 DIAGNOSIS — E1122 Type 2 diabetes mellitus with diabetic chronic kidney disease: Secondary | ICD-10-CM | POA: Diagnosis not present

## 2021-02-26 DIAGNOSIS — I69398 Other sequelae of cerebral infarction: Secondary | ICD-10-CM | POA: Diagnosis not present

## 2021-02-26 DIAGNOSIS — I1310 Hypertensive heart and chronic kidney disease without heart failure, with stage 1 through stage 4 chronic kidney disease, or unspecified chronic kidney disease: Secondary | ICD-10-CM | POA: Diagnosis not present

## 2021-02-26 DIAGNOSIS — N1831 Chronic kidney disease, stage 3a: Secondary | ICD-10-CM | POA: Diagnosis not present

## 2021-02-26 DIAGNOSIS — M160 Bilateral primary osteoarthritis of hip: Secondary | ICD-10-CM | POA: Diagnosis not present

## 2021-02-26 DIAGNOSIS — M47812 Spondylosis without myelopathy or radiculopathy, cervical region: Secondary | ICD-10-CM | POA: Diagnosis not present

## 2021-02-26 DIAGNOSIS — M8580 Other specified disorders of bone density and structure, unspecified site: Secondary | ICD-10-CM | POA: Diagnosis not present

## 2021-03-02 DIAGNOSIS — I69398 Other sequelae of cerebral infarction: Secondary | ICD-10-CM | POA: Diagnosis not present

## 2021-03-02 DIAGNOSIS — M160 Bilateral primary osteoarthritis of hip: Secondary | ICD-10-CM | POA: Diagnosis not present

## 2021-03-02 DIAGNOSIS — N182 Chronic kidney disease, stage 2 (mild): Secondary | ICD-10-CM | POA: Diagnosis not present

## 2021-03-02 DIAGNOSIS — I10 Essential (primary) hypertension: Secondary | ICD-10-CM | POA: Diagnosis not present

## 2021-03-02 DIAGNOSIS — I081 Rheumatic disorders of both mitral and tricuspid valves: Secondary | ICD-10-CM | POA: Diagnosis not present

## 2021-03-02 DIAGNOSIS — M47812 Spondylosis without myelopathy or radiculopathy, cervical region: Secondary | ICD-10-CM | POA: Diagnosis not present

## 2021-03-02 DIAGNOSIS — R29898 Other symptoms and signs involving the musculoskeletal system: Secondary | ICD-10-CM | POA: Diagnosis not present

## 2021-03-02 DIAGNOSIS — N1831 Chronic kidney disease, stage 3a: Secondary | ICD-10-CM | POA: Diagnosis not present

## 2021-03-02 DIAGNOSIS — E1122 Type 2 diabetes mellitus with diabetic chronic kidney disease: Secondary | ICD-10-CM | POA: Diagnosis not present

## 2021-03-02 DIAGNOSIS — E1169 Type 2 diabetes mellitus with other specified complication: Secondary | ICD-10-CM | POA: Diagnosis not present

## 2021-03-02 DIAGNOSIS — I1310 Hypertensive heart and chronic kidney disease without heart failure, with stage 1 through stage 4 chronic kidney disease, or unspecified chronic kidney disease: Secondary | ICD-10-CM | POA: Diagnosis not present

## 2021-03-02 DIAGNOSIS — M8580 Other specified disorders of bone density and structure, unspecified site: Secondary | ICD-10-CM | POA: Diagnosis not present

## 2021-03-02 DIAGNOSIS — Z794 Long term (current) use of insulin: Secondary | ICD-10-CM | POA: Diagnosis not present

## 2021-03-02 DIAGNOSIS — E785 Hyperlipidemia, unspecified: Secondary | ICD-10-CM | POA: Diagnosis not present

## 2021-03-04 DIAGNOSIS — Z95818 Presence of other cardiac implants and grafts: Secondary | ICD-10-CM

## 2021-03-04 DIAGNOSIS — I63412 Cerebral infarction due to embolism of left middle cerebral artery: Secondary | ICD-10-CM | POA: Diagnosis not present

## 2021-03-04 HISTORY — DX: Presence of other cardiac implants and grafts: Z95.818

## 2021-03-04 HISTORY — PX: LOOP RECORDER IMPLANT: SHX5954

## 2021-03-11 DIAGNOSIS — E1169 Type 2 diabetes mellitus with other specified complication: Secondary | ICD-10-CM | POA: Diagnosis not present

## 2021-03-11 DIAGNOSIS — E785 Hyperlipidemia, unspecified: Secondary | ICD-10-CM | POA: Diagnosis not present

## 2021-03-11 DIAGNOSIS — N182 Chronic kidney disease, stage 2 (mild): Secondary | ICD-10-CM | POA: Diagnosis not present

## 2021-03-11 DIAGNOSIS — I779 Disorder of arteries and arterioles, unspecified: Secondary | ICD-10-CM | POA: Diagnosis not present

## 2021-03-11 DIAGNOSIS — I129 Hypertensive chronic kidney disease with stage 1 through stage 4 chronic kidney disease, or unspecified chronic kidney disease: Secondary | ICD-10-CM | POA: Diagnosis not present

## 2021-03-11 DIAGNOSIS — E1122 Type 2 diabetes mellitus with diabetic chronic kidney disease: Secondary | ICD-10-CM | POA: Diagnosis not present

## 2021-03-11 DIAGNOSIS — Z794 Long term (current) use of insulin: Secondary | ICD-10-CM | POA: Diagnosis not present

## 2021-03-12 DIAGNOSIS — Z95818 Presence of other cardiac implants and grafts: Secondary | ICD-10-CM | POA: Diagnosis not present

## 2021-03-12 DIAGNOSIS — Z8673 Personal history of transient ischemic attack (TIA), and cerebral infarction without residual deficits: Secondary | ICD-10-CM | POA: Insufficient documentation

## 2021-03-13 DIAGNOSIS — I1310 Hypertensive heart and chronic kidney disease without heart failure, with stage 1 through stage 4 chronic kidney disease, or unspecified chronic kidney disease: Secondary | ICD-10-CM | POA: Diagnosis not present

## 2021-03-13 DIAGNOSIS — M8580 Other specified disorders of bone density and structure, unspecified site: Secondary | ICD-10-CM | POA: Diagnosis not present

## 2021-03-13 DIAGNOSIS — N1831 Chronic kidney disease, stage 3a: Secondary | ICD-10-CM | POA: Diagnosis not present

## 2021-03-13 DIAGNOSIS — E1122 Type 2 diabetes mellitus with diabetic chronic kidney disease: Secondary | ICD-10-CM | POA: Diagnosis not present

## 2021-03-13 DIAGNOSIS — I69398 Other sequelae of cerebral infarction: Secondary | ICD-10-CM | POA: Diagnosis not present

## 2021-03-13 DIAGNOSIS — I081 Rheumatic disorders of both mitral and tricuspid valves: Secondary | ICD-10-CM | POA: Diagnosis not present

## 2021-03-13 DIAGNOSIS — M160 Bilateral primary osteoarthritis of hip: Secondary | ICD-10-CM | POA: Diagnosis not present

## 2021-03-13 DIAGNOSIS — M47812 Spondylosis without myelopathy or radiculopathy, cervical region: Secondary | ICD-10-CM | POA: Diagnosis not present

## 2021-03-13 DIAGNOSIS — R29898 Other symptoms and signs involving the musculoskeletal system: Secondary | ICD-10-CM | POA: Diagnosis not present

## 2021-03-16 DIAGNOSIS — M47812 Spondylosis without myelopathy or radiculopathy, cervical region: Secondary | ICD-10-CM | POA: Diagnosis not present

## 2021-03-16 DIAGNOSIS — I1310 Hypertensive heart and chronic kidney disease without heart failure, with stage 1 through stage 4 chronic kidney disease, or unspecified chronic kidney disease: Secondary | ICD-10-CM | POA: Diagnosis not present

## 2021-03-16 DIAGNOSIS — R29898 Other symptoms and signs involving the musculoskeletal system: Secondary | ICD-10-CM | POA: Diagnosis not present

## 2021-03-16 DIAGNOSIS — I081 Rheumatic disorders of both mitral and tricuspid valves: Secondary | ICD-10-CM | POA: Diagnosis not present

## 2021-03-16 DIAGNOSIS — E1122 Type 2 diabetes mellitus with diabetic chronic kidney disease: Secondary | ICD-10-CM | POA: Diagnosis not present

## 2021-03-16 DIAGNOSIS — M160 Bilateral primary osteoarthritis of hip: Secondary | ICD-10-CM | POA: Diagnosis not present

## 2021-03-16 DIAGNOSIS — I69398 Other sequelae of cerebral infarction: Secondary | ICD-10-CM | POA: Diagnosis not present

## 2021-03-16 DIAGNOSIS — M8580 Other specified disorders of bone density and structure, unspecified site: Secondary | ICD-10-CM | POA: Diagnosis not present

## 2021-03-16 DIAGNOSIS — N1831 Chronic kidney disease, stage 3a: Secondary | ICD-10-CM | POA: Diagnosis not present

## 2021-03-23 DIAGNOSIS — I1310 Hypertensive heart and chronic kidney disease without heart failure, with stage 1 through stage 4 chronic kidney disease, or unspecified chronic kidney disease: Secondary | ICD-10-CM | POA: Diagnosis not present

## 2021-03-23 DIAGNOSIS — R29898 Other symptoms and signs involving the musculoskeletal system: Secondary | ICD-10-CM | POA: Diagnosis not present

## 2021-03-23 DIAGNOSIS — M160 Bilateral primary osteoarthritis of hip: Secondary | ICD-10-CM | POA: Diagnosis not present

## 2021-03-23 DIAGNOSIS — N1831 Chronic kidney disease, stage 3a: Secondary | ICD-10-CM | POA: Diagnosis not present

## 2021-03-23 DIAGNOSIS — I081 Rheumatic disorders of both mitral and tricuspid valves: Secondary | ICD-10-CM | POA: Diagnosis not present

## 2021-03-23 DIAGNOSIS — I69398 Other sequelae of cerebral infarction: Secondary | ICD-10-CM | POA: Diagnosis not present

## 2021-03-23 DIAGNOSIS — M8580 Other specified disorders of bone density and structure, unspecified site: Secondary | ICD-10-CM | POA: Diagnosis not present

## 2021-03-23 DIAGNOSIS — M47812 Spondylosis without myelopathy or radiculopathy, cervical region: Secondary | ICD-10-CM | POA: Diagnosis not present

## 2021-03-23 DIAGNOSIS — E1122 Type 2 diabetes mellitus with diabetic chronic kidney disease: Secondary | ICD-10-CM | POA: Diagnosis not present

## 2021-04-12 DIAGNOSIS — G4733 Obstructive sleep apnea (adult) (pediatric): Secondary | ICD-10-CM | POA: Diagnosis not present

## 2021-06-09 DIAGNOSIS — G4733 Obstructive sleep apnea (adult) (pediatric): Secondary | ICD-10-CM | POA: Diagnosis not present

## 2021-06-09 DIAGNOSIS — I779 Disorder of arteries and arterioles, unspecified: Secondary | ICD-10-CM | POA: Diagnosis not present

## 2021-06-09 DIAGNOSIS — Z8673 Personal history of transient ischemic attack (TIA), and cerebral infarction without residual deficits: Secondary | ICD-10-CM | POA: Diagnosis not present

## 2021-06-10 DIAGNOSIS — Z794 Long term (current) use of insulin: Secondary | ICD-10-CM | POA: Diagnosis not present

## 2021-06-10 DIAGNOSIS — E1122 Type 2 diabetes mellitus with diabetic chronic kidney disease: Secondary | ICD-10-CM | POA: Diagnosis not present

## 2021-06-10 DIAGNOSIS — E1169 Type 2 diabetes mellitus with other specified complication: Secondary | ICD-10-CM | POA: Diagnosis not present

## 2021-06-10 DIAGNOSIS — Z95818 Presence of other cardiac implants and grafts: Secondary | ICD-10-CM | POA: Diagnosis not present

## 2021-06-10 DIAGNOSIS — N182 Chronic kidney disease, stage 2 (mild): Secondary | ICD-10-CM | POA: Diagnosis not present

## 2021-06-10 DIAGNOSIS — Z8673 Personal history of transient ischemic attack (TIA), and cerebral infarction without residual deficits: Secondary | ICD-10-CM | POA: Diagnosis not present

## 2021-06-10 DIAGNOSIS — E785 Hyperlipidemia, unspecified: Secondary | ICD-10-CM | POA: Diagnosis not present

## 2021-06-10 DIAGNOSIS — I10 Essential (primary) hypertension: Secondary | ICD-10-CM | POA: Diagnosis not present

## 2021-07-07 DIAGNOSIS — Z8616 Personal history of COVID-19: Secondary | ICD-10-CM | POA: Diagnosis not present

## 2021-07-07 DIAGNOSIS — Z8673 Personal history of transient ischemic attack (TIA), and cerebral infarction without residual deficits: Secondary | ICD-10-CM | POA: Diagnosis not present

## 2021-07-07 DIAGNOSIS — G4733 Obstructive sleep apnea (adult) (pediatric): Secondary | ICD-10-CM | POA: Diagnosis not present

## 2021-07-07 DIAGNOSIS — I779 Disorder of arteries and arterioles, unspecified: Secondary | ICD-10-CM | POA: Diagnosis not present

## 2021-07-23 DIAGNOSIS — E785 Hyperlipidemia, unspecified: Secondary | ICD-10-CM | POA: Diagnosis not present

## 2021-07-23 DIAGNOSIS — E1169 Type 2 diabetes mellitus with other specified complication: Secondary | ICD-10-CM | POA: Diagnosis not present

## 2021-07-23 DIAGNOSIS — E1122 Type 2 diabetes mellitus with diabetic chronic kidney disease: Secondary | ICD-10-CM | POA: Diagnosis not present

## 2021-07-23 DIAGNOSIS — I10 Essential (primary) hypertension: Secondary | ICD-10-CM | POA: Diagnosis not present

## 2021-07-23 DIAGNOSIS — Z794 Long term (current) use of insulin: Secondary | ICD-10-CM | POA: Diagnosis not present

## 2021-07-23 DIAGNOSIS — N182 Chronic kidney disease, stage 2 (mild): Secondary | ICD-10-CM | POA: Diagnosis not present

## 2021-07-30 DIAGNOSIS — E1122 Type 2 diabetes mellitus with diabetic chronic kidney disease: Secondary | ICD-10-CM | POA: Diagnosis not present

## 2021-07-30 DIAGNOSIS — I779 Disorder of arteries and arterioles, unspecified: Secondary | ICD-10-CM | POA: Diagnosis not present

## 2021-07-30 DIAGNOSIS — E1169 Type 2 diabetes mellitus with other specified complication: Secondary | ICD-10-CM | POA: Diagnosis not present

## 2021-07-30 DIAGNOSIS — Z0000 Encounter for general adult medical examination without abnormal findings: Secondary | ICD-10-CM | POA: Diagnosis not present

## 2021-07-30 DIAGNOSIS — Z1389 Encounter for screening for other disorder: Secondary | ICD-10-CM | POA: Diagnosis not present

## 2021-07-30 DIAGNOSIS — E785 Hyperlipidemia, unspecified: Secondary | ICD-10-CM | POA: Diagnosis not present

## 2021-07-30 DIAGNOSIS — Z794 Long term (current) use of insulin: Secondary | ICD-10-CM | POA: Diagnosis not present

## 2021-07-30 DIAGNOSIS — N182 Chronic kidney disease, stage 2 (mild): Secondary | ICD-10-CM | POA: Diagnosis not present

## 2021-08-12 DIAGNOSIS — Z8673 Personal history of transient ischemic attack (TIA), and cerebral infarction without residual deficits: Secondary | ICD-10-CM | POA: Diagnosis not present

## 2021-10-08 DIAGNOSIS — Z95818 Presence of other cardiac implants and grafts: Secondary | ICD-10-CM | POA: Diagnosis not present

## 2021-10-08 DIAGNOSIS — I10 Essential (primary) hypertension: Secondary | ICD-10-CM | POA: Diagnosis not present

## 2021-10-08 DIAGNOSIS — E1122 Type 2 diabetes mellitus with diabetic chronic kidney disease: Secondary | ICD-10-CM | POA: Diagnosis not present

## 2021-10-08 DIAGNOSIS — N182 Chronic kidney disease, stage 2 (mild): Secondary | ICD-10-CM | POA: Diagnosis not present

## 2021-10-08 DIAGNOSIS — Z794 Long term (current) use of insulin: Secondary | ICD-10-CM | POA: Diagnosis not present

## 2021-10-08 DIAGNOSIS — Z8673 Personal history of transient ischemic attack (TIA), and cerebral infarction without residual deficits: Secondary | ICD-10-CM | POA: Diagnosis not present

## 2021-11-18 ENCOUNTER — Encounter: Payer: Self-pay | Admitting: *Deleted

## 2021-11-18 ENCOUNTER — Emergency Department
Admission: EM | Admit: 2021-11-18 | Discharge: 2021-11-18 | Discharge status: Against Medical Advice | Payer: Medicare HMO | Attending: Emergency Medicine | Admitting: Emergency Medicine

## 2021-11-18 ENCOUNTER — Other Ambulatory Visit: Payer: Self-pay

## 2021-11-18 DIAGNOSIS — E11649 Type 2 diabetes mellitus with hypoglycemia without coma: Secondary | ICD-10-CM | POA: Insufficient documentation

## 2021-11-18 DIAGNOSIS — Z5321 Procedure and treatment not carried out due to patient leaving prior to being seen by health care provider: Secondary | ICD-10-CM | POA: Diagnosis not present

## 2021-11-18 LAB — BASIC METABOLIC PANEL
Anion gap: 8 (ref 5–15)
BUN: 28 mg/dL — ABNORMAL HIGH (ref 8–23)
CO2: 23 mmol/L (ref 22–32)
Calcium: 9.8 mg/dL (ref 8.9–10.3)
Chloride: 109 mmol/L (ref 98–111)
Creatinine, Ser: 1.06 mg/dL — ABNORMAL HIGH (ref 0.44–1.00)
GFR, Estimated: 54 mL/min — ABNORMAL LOW (ref >60)
Glucose, Bld: 116 mg/dL — ABNORMAL HIGH (ref 70–99)
Potassium: 4.6 mmol/L (ref 3.5–5.1)
Sodium: 140 mmol/L (ref 135–145)

## 2021-11-18 LAB — CBC WITH DIFFERENTIAL/PLATELET
Abs Immature Granulocytes: 0.03 10*3/uL (ref 0.00–0.07)
Basophils Absolute: 0.1 10*3/uL (ref 0.0–0.1)
Basophils Relative: 1 %
Eosinophils Absolute: 0.4 10*3/uL (ref 0.0–0.5)
Eosinophils Relative: 4 %
HCT: 44.9 % (ref 36.0–46.0)
Hemoglobin: 14.6 g/dL (ref 12.0–15.0)
Immature Granulocytes: 0 %
Lymphocytes Relative: 19 %
Lymphs Abs: 1.8 10*3/uL (ref 0.7–4.0)
MCH: 29.6 pg (ref 26.0–34.0)
MCHC: 32.5 g/dL (ref 30.0–36.0)
MCV: 91.1 fL (ref 80.0–100.0)
Monocytes Absolute: 0.7 10*3/uL (ref 0.1–1.0)
Monocytes Relative: 8 %
Neutro Abs: 6.5 10*3/uL (ref 1.7–7.7)
Neutrophils Relative %: 68 %
Platelets: 245 10*3/uL (ref 150–400)
RBC: 4.93 MIL/uL (ref 3.87–5.11)
RDW: 12.5 % (ref 11.5–15.5)
WBC: 9.5 10*3/uL (ref 4.0–10.5)
nRBC: 0 % (ref 0.0–0.2)

## 2021-11-18 LAB — URINALYSIS, ROUTINE W REFLEX MICROSCOPIC
Bacteria, UA: NONE SEEN
Bilirubin Urine: NEGATIVE
Glucose, UA: >=500 mg/dL — AB
Hgb urine dipstick: NEGATIVE
Ketones, ur: NEGATIVE mg/dL
Nitrite: NEGATIVE
Protein, ur: NEGATIVE mg/dL
Specific Gravity, Urine: 1.016 (ref 1.005–1.030)
pH: 6 (ref 5.0–8.0)

## 2021-11-18 LAB — CBG MONITORING, ED: Glucose-Capillary: 121 mg/dL — ABNORMAL HIGH (ref 70–99)

## 2021-11-18 NOTE — ED Provider Triage Note (Signed)
  Emergency Medicine Provider Triage Evaluation Note  Morgan Nicholson , a 76 y.o.female,  was evaluated in triage.  Pt complains of hypoglycemia.  Patient states that she has diabetes and is currently on medications, however she states that her blood sugar was 67 this morning and has not been able to rise more than 30 points after eating several sugary foods.  She states that she feels weak and fatigued.   Review of Systems  Positive: Fatigue Negative: Denies fever, chest pain, vomiting  Physical Exam  There were no vitals filed for this visit. Gen:   Awake, no distress   Resp:  Normal effort  MSK:   Moves extremities without difficulty  Other:    Medical Decision Making  Given the patient's initial medical screening exam, the following diagnostic evaluation has been ordered. The patient will be placed in the appropriate treatment space, once one is available, to complete the evaluation and treatment. I have discussed the plan of care with the patient and I have advised the patient that an ED physician or mid-level practitioner will reevaluate their condition after the test results have been received, as the results may give them additional insight into the type of treatment they may need.    Diagnostics: Labs, UA  Treatments: none immediately   Teodoro Spray, [place] 11/18/21 [phone]

## 2021-11-18 NOTE — ED Triage Notes (Signed)
Pt states her blood sugar is low today.  No n/v/d  pt states her new monitor is sending her alerts.  Pt alert  speech clear.

## 2021-11-23 DIAGNOSIS — Z794 Long term (current) use of insulin: Secondary | ICD-10-CM | POA: Diagnosis not present

## 2021-11-23 DIAGNOSIS — E785 Hyperlipidemia, unspecified: Secondary | ICD-10-CM | POA: Diagnosis not present

## 2021-11-23 DIAGNOSIS — E1122 Type 2 diabetes mellitus with diabetic chronic kidney disease: Secondary | ICD-10-CM | POA: Diagnosis not present

## 2021-11-23 DIAGNOSIS — I779 Disorder of arteries and arterioles, unspecified: Secondary | ICD-10-CM | POA: Diagnosis not present

## 2021-11-23 DIAGNOSIS — E1169 Type 2 diabetes mellitus with other specified complication: Secondary | ICD-10-CM | POA: Diagnosis not present

## 2021-11-23 DIAGNOSIS — N182 Chronic kidney disease, stage 2 (mild): Secondary | ICD-10-CM | POA: Diagnosis not present

## 2021-11-30 DIAGNOSIS — Z794 Long term (current) use of insulin: Secondary | ICD-10-CM | POA: Diagnosis not present

## 2021-11-30 DIAGNOSIS — I779 Disorder of arteries and arterioles, unspecified: Secondary | ICD-10-CM | POA: Diagnosis not present

## 2021-11-30 DIAGNOSIS — E1169 Type 2 diabetes mellitus with other specified complication: Secondary | ICD-10-CM | POA: Diagnosis not present

## 2021-11-30 DIAGNOSIS — E1122 Type 2 diabetes mellitus with diabetic chronic kidney disease: Secondary | ICD-10-CM | POA: Diagnosis not present

## 2021-11-30 DIAGNOSIS — E785 Hyperlipidemia, unspecified: Secondary | ICD-10-CM | POA: Diagnosis not present

## 2021-11-30 DIAGNOSIS — I129 Hypertensive chronic kidney disease with stage 1 through stage 4 chronic kidney disease, or unspecified chronic kidney disease: Secondary | ICD-10-CM | POA: Diagnosis not present

## 2021-11-30 DIAGNOSIS — M4312 Spondylolisthesis, cervical region: Secondary | ICD-10-CM | POA: Diagnosis not present

## 2021-11-30 DIAGNOSIS — N182 Chronic kidney disease, stage 2 (mild): Secondary | ICD-10-CM | POA: Diagnosis not present

## 2021-11-30 DIAGNOSIS — M542 Cervicalgia: Secondary | ICD-10-CM | POA: Diagnosis not present

## 2021-12-04 DIAGNOSIS — E785 Hyperlipidemia, unspecified: Secondary | ICD-10-CM | POA: Diagnosis not present

## 2021-12-04 DIAGNOSIS — I10 Essential (primary) hypertension: Secondary | ICD-10-CM | POA: Diagnosis not present

## 2021-12-04 DIAGNOSIS — E1122 Type 2 diabetes mellitus with diabetic chronic kidney disease: Secondary | ICD-10-CM | POA: Diagnosis not present

## 2021-12-04 DIAGNOSIS — E1169 Type 2 diabetes mellitus with other specified complication: Secondary | ICD-10-CM | POA: Diagnosis not present

## 2021-12-04 DIAGNOSIS — Z794 Long term (current) use of insulin: Secondary | ICD-10-CM | POA: Diagnosis not present

## 2021-12-04 DIAGNOSIS — N182 Chronic kidney disease, stage 2 (mild): Secondary | ICD-10-CM | POA: Diagnosis not present

## 2021-12-04 DIAGNOSIS — I779 Disorder of arteries and arterioles, unspecified: Secondary | ICD-10-CM | POA: Diagnosis not present

## 2021-12-10 DIAGNOSIS — E1169 Type 2 diabetes mellitus with other specified complication: Secondary | ICD-10-CM | POA: Diagnosis not present

## 2021-12-10 DIAGNOSIS — R2689 Other abnormalities of gait and mobility: Secondary | ICD-10-CM | POA: Diagnosis not present

## 2021-12-10 DIAGNOSIS — M542 Cervicalgia: Secondary | ICD-10-CM | POA: Diagnosis not present

## 2021-12-10 DIAGNOSIS — Z794 Long term (current) use of insulin: Secondary | ICD-10-CM | POA: Diagnosis not present

## 2021-12-10 DIAGNOSIS — G4733 Obstructive sleep apnea (adult) (pediatric): Secondary | ICD-10-CM | POA: Diagnosis not present

## 2021-12-10 DIAGNOSIS — E785 Hyperlipidemia, unspecified: Secondary | ICD-10-CM | POA: Diagnosis not present

## 2021-12-10 DIAGNOSIS — I152 Hypertension secondary to endocrine disorders: Secondary | ICD-10-CM | POA: Diagnosis not present

## 2021-12-10 DIAGNOSIS — E1159 Type 2 diabetes mellitus with other circulatory complications: Secondary | ICD-10-CM | POA: Diagnosis not present

## 2021-12-10 DIAGNOSIS — Z8673 Personal history of transient ischemic attack (TIA), and cerebral infarction without residual deficits: Secondary | ICD-10-CM | POA: Diagnosis not present

## 2021-12-17 ENCOUNTER — Other Ambulatory Visit: Payer: Self-pay | Admitting: Student

## 2021-12-17 ENCOUNTER — Other Ambulatory Visit (HOSPITAL_COMMUNITY): Payer: Self-pay | Admitting: Student

## 2021-12-17 DIAGNOSIS — Z8673 Personal history of transient ischemic attack (TIA), and cerebral infarction without residual deficits: Secondary | ICD-10-CM

## 2021-12-18 DIAGNOSIS — E1165 Type 2 diabetes mellitus with hyperglycemia: Secondary | ICD-10-CM | POA: Diagnosis not present

## 2021-12-21 DIAGNOSIS — M799 Soft tissue disorder, unspecified: Secondary | ICD-10-CM | POA: Diagnosis not present

## 2021-12-21 DIAGNOSIS — R2689 Other abnormalities of gait and mobility: Secondary | ICD-10-CM | POA: Diagnosis not present

## 2021-12-21 DIAGNOSIS — M6281 Muscle weakness (generalized): Secondary | ICD-10-CM | POA: Diagnosis not present

## 2021-12-23 DIAGNOSIS — R2689 Other abnormalities of gait and mobility: Secondary | ICD-10-CM | POA: Diagnosis not present

## 2021-12-23 DIAGNOSIS — M6281 Muscle weakness (generalized): Secondary | ICD-10-CM | POA: Diagnosis not present

## 2021-12-23 DIAGNOSIS — M799 Soft tissue disorder, unspecified: Secondary | ICD-10-CM | POA: Diagnosis not present

## 2021-12-25 ENCOUNTER — Ambulatory Visit
Admission: RE | Admit: 2021-12-25 | Discharge: 2021-12-25 | Disposition: A | Discharge status: Home / Self Care | Payer: Medicare HMO | Source: Ambulatory Visit | Attending: Student | Admitting: Student

## 2021-12-25 DIAGNOSIS — Z8673 Personal history of transient ischemic attack (TIA), and cerebral infarction without residual deficits: Secondary | ICD-10-CM | POA: Insufficient documentation

## 2021-12-25 DIAGNOSIS — M4802 Spinal stenosis, cervical region: Secondary | ICD-10-CM | POA: Diagnosis not present

## 2021-12-25 DIAGNOSIS — M50221 Other cervical disc displacement at C4-C5 level: Secondary | ICD-10-CM | POA: Diagnosis not present

## 2021-12-25 DIAGNOSIS — M50222 Other cervical disc displacement at C5-C6 level: Secondary | ICD-10-CM | POA: Diagnosis not present

## 2021-12-29 DIAGNOSIS — R2689 Other abnormalities of gait and mobility: Secondary | ICD-10-CM | POA: Diagnosis not present

## 2021-12-29 DIAGNOSIS — M799 Soft tissue disorder, unspecified: Secondary | ICD-10-CM | POA: Diagnosis not present

## 2021-12-29 DIAGNOSIS — M6281 Muscle weakness (generalized): Secondary | ICD-10-CM | POA: Diagnosis not present

## 2021-12-30 DIAGNOSIS — I10 Essential (primary) hypertension: Secondary | ICD-10-CM | POA: Diagnosis not present

## 2021-12-30 DIAGNOSIS — Z794 Long term (current) use of insulin: Secondary | ICD-10-CM | POA: Diagnosis not present

## 2021-12-30 DIAGNOSIS — E785 Hyperlipidemia, unspecified: Secondary | ICD-10-CM | POA: Diagnosis not present

## 2021-12-30 DIAGNOSIS — E1169 Type 2 diabetes mellitus with other specified complication: Secondary | ICD-10-CM | POA: Diagnosis not present

## 2021-12-30 DIAGNOSIS — E1122 Type 2 diabetes mellitus with diabetic chronic kidney disease: Secondary | ICD-10-CM | POA: Diagnosis not present

## 2021-12-30 DIAGNOSIS — I779 Disorder of arteries and arterioles, unspecified: Secondary | ICD-10-CM | POA: Diagnosis not present

## 2021-12-30 DIAGNOSIS — N182 Chronic kidney disease, stage 2 (mild): Secondary | ICD-10-CM | POA: Diagnosis not present

## 2021-12-30 NOTE — Progress Notes (Unsigned)
Referring Physician:  Kerry Dory, PA-C [place],  [place] [address]  Primary Physician:  Kirk Ruths, MD  History of Present Illness: 12/31/2021 Morgan Nicholson is here today with a chief complaint of has an area on the right side of her neck (about the size of her hand) where she feels tightness, tingling, numbness.  Is been having symptoms for approximately 6 months.  Of note, she did have a stroke last October.  She has recovered reasonably well but had some resulting balance issues from this.  She has been working on physical therapy for her balance, but not for her neck.  Turning her head to the right causes discomfort.  Nothing really helps.   Bowel/Bladder Dysfunction: none  Conservative measures:  Physical therapy: currently participating in for her balance at [facility] therapy including regular antiinflammatories:  tylenol Injections: has not had epidural steroid injections  Past Surgery:  denies   Morgan Nicholson has no symptoms of cervical myelopathy.  The symptoms are causing a significant impact on the patient's life.   Review of Systems:  A 10 point review of systems is negative, except for the pertinent positives and negatives detailed in the HPI.  Past Medical History: Past Medical History:  Diagnosis Date   Arthritis    in hips   Cancer ([place])    skin cancer   Chronic kidney disease    stage 2   Diabetes mellitus without complication (HCC)    High cholesterol    Hypertension    Osteopenia     Past Surgical History: Past Surgical History:  Procedure Laterality Date   CARPAL TUNNEL RELEASE Right 05/01/2020   Procedure: CARPAL TUNNEL RELEASE ENDOSCOPIC;  Surgeon: Corky Mull, MD;  Location: [facility] ORS;  Service: Orthopedics;  Laterality: Right;   colonic polyps     COLONOSCOPY WITH PROPOFOL N/A 02/23/2017   Procedure: COLONOSCOPY WITH PROPOFOL;  Surgeon: Manya Silvas, MD;  Location: [facility] ENDOSCOPY;   Service: Endoscopy;  Laterality: N/A;   skin cancer removal Bilateral    arms   TRIGGER FINGER RELEASE Right 05/01/2020   Procedure: RELEASE TRIGGER FINGER/A-1 PULLEY;  Surgeon: Corky Mull, MD;  Location: [facility] ORS;  Service: Orthopedics;  Laterality: Right;   TUBAL LIGATION      Allergies: Allergies as of 12/31/2021   (No Known Allergies)    Medications: Current Meds  Medication Sig   acetaminophen (TYLENOL) 500 MG tablet Take 1,000 mg by mouth every 6 (six) hours as needed for mild pain.   aspirin EC 81 MG tablet Take 1 tablet (81 mg total) by mouth every evening. Take it with your plavix   atorvastatin (LIPITOR) 80 MG tablet Take 80 mg by mouth every evening.   Calcium Carb-Cholecalciferol (CALCIUM 600+D3 PO) Take 1 tablet by mouth in the morning and at bedtime.   carvedilol (COREG) 12.5 MG tablet Take 12.5 mg by mouth 2 (two) times daily.   fluocinonide (LIDEX) 0.05 % external solution Apply 1 application topically daily as needed (scalp dermatitis).   glimepiride (AMARYL) 2 MG tablet Take 2 mg by mouth daily with supper.   insulin NPH-regular Human (NOVOLIN 70/30) (70-30) 100 UNIT/ML injection Inject 26 Units into the skin 2 (two) times daily with a meal. With breakfast & with supper   ketoconazole (NIZORAL) 2 % shampoo Apply 1 application topically every other day.   lisinopril (PRINIVIL,ZESTRIL) 40 MG tablet Take 40 mg daily by mouth.   metFORMIN (GLUCOPHAGE)  1000 MG tablet Take 1,000 mg by mouth 2 (two) times daily with a meal.    Social History: Social History   Tobacco Use   Smoking status: Never   Smokeless tobacco: Never  Vaping Use   Vaping Use: Never used  Substance Use Topics   Alcohol use: No   Drug use: No    Family Medical History: Family History  Problem Relation Age of Onset   Breast cancer Neg Hx     Physical Examination: There were no vitals filed for this visit.  General: Patient is well developed, well nourished, calm, collected, and in no  apparent distress. Attention to examination is appropriate.  Neck:   Supple.  Limited rotation to the right with some discomfort in her neck Respiratory: Patient is breathing without any difficulty.   NEUROLOGICAL:     Awake, alert, oriented to person, place, and time.  Speech is clear and fluent. Fund of knowledge is appropriate.   Cranial Nerves: Pupils equal round and reactive to light.  Facial tone is symmetric.  Facial sensation is symmetric. Shoulder shrug is symmetric. Tongue protrusion is midline.  There is no pronator drift.  ROM of spine: full.    Strength: Side Biceps Triceps Deltoid Interossei Grip Wrist Ext. Wrist Flex.  R '5 5 5 5 5 5 5  '$ L '5 5 5 5 5 5 5   '$ Side Iliopsoas Quads Hamstring PF DF EHL  R '5 5 5 5 5 5  '$ L '5 5 5 5 5 5   '$ Reflexes are 1+ and symmetric at the biceps, triceps, brachioradialis, patella and achilles.   Hoffman's is absent.  Clonus is not present.  Toes are down-going.  Bilateral upper and lower extremity sensation is intact to light touch.    No evidence of dysmetria noted.  Gait is slowed and requires a cane  Medical Decision Making  Imaging: MRI C spine 12/25/21   C4-5: Moderate right facet hypertrophy and small disc bulge. Moderate spinal canal stenosis. Severe right and moderate left neural foraminal stenosis.   C5-6: Small disc bulge with bilateral uncovertebral hypertrophy. Moderate spinal canal stenosis. Moderate right and severe left neural foraminal stenosis.   C6-7: No disc herniation. Ligamentum flavum infolding. There is no spinal canal stenosis. No neural foraminal stenosis.   C7-T1: Normal disc space and facet joints. There is no spinal canal stenosis. No neural foraminal stenosis.   IMPRESSION: 1. 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 2. Multilevel moderate to severe facet arthrosis may be a source of local neck pain.      Electronically Signed   By: Ulyses Jarred M.D.   On: 12/28/2021 01:54  I have personally reviewed the images and agree with the above interpretation.  Assessment and Plan: Morgan Nicholson is a pleasant 76 y.o. female with moderate cervical stenosis and with neck pain.  She has history of a stroke affecting her left sided frontal lobe near her primary motor system.  I feel that her balance issues are more likely to be from her recovery from her stroke.  She does not have any frankly myelopathic or radiculopathic symptoms at this time.  I would not recommend surgical intervention.  Her pain at this point is not very severe, as she would not even consider taking NSAIDs for it.  I think that proceeding with physical therapy for her neck pain is the most appropriate step forward.  We will see her  back in 6 weeks.  If she is continues to have pain or if it worsens, we will discuss injections for her at that time.   I spent a total of 30 minutes in face-to-face and non-face-to-face activities related to this patient's care today.  Thank you for involving me in the care of this patient.      Irania Durell K. Izora Ribas MD, [facility] Neurosurgery

## 2021-12-31 ENCOUNTER — Encounter: Payer: Self-pay | Admitting: Neurosurgery

## 2021-12-31 ENCOUNTER — Ambulatory Visit: Payer: Medicare HMO | Admitting: Neurosurgery

## 2021-12-31 VITALS — BP 118/74 | Ht 65.0 in | Wt 175.6 lb

## 2021-12-31 DIAGNOSIS — M542 Cervicalgia: Secondary | ICD-10-CM

## 2021-12-31 DIAGNOSIS — M4802 Spinal stenosis, cervical region: Secondary | ICD-10-CM | POA: Diagnosis not present

## 2021-12-31 DIAGNOSIS — M799 Soft tissue disorder, unspecified: Secondary | ICD-10-CM | POA: Diagnosis not present

## 2021-12-31 DIAGNOSIS — I639 Cerebral infarction, unspecified: Secondary | ICD-10-CM

## 2021-12-31 DIAGNOSIS — M6281 Muscle weakness (generalized): Secondary | ICD-10-CM | POA: Diagnosis not present

## 2021-12-31 DIAGNOSIS — R2689 Other abnormalities of gait and mobility: Secondary | ICD-10-CM | POA: Diagnosis not present

## 2022-01-05 ENCOUNTER — Other Ambulatory Visit: Payer: Self-pay | Admitting: Internal Medicine

## 2022-01-05 DIAGNOSIS — Z1231 Encounter for screening mammogram for malignant neoplasm of breast: Secondary | ICD-10-CM

## 2022-01-06 DIAGNOSIS — M6281 Muscle weakness (generalized): Secondary | ICD-10-CM | POA: Diagnosis not present

## 2022-01-06 DIAGNOSIS — R2689 Other abnormalities of gait and mobility: Secondary | ICD-10-CM | POA: Diagnosis not present

## 2022-01-06 DIAGNOSIS — M799 Soft tissue disorder, unspecified: Secondary | ICD-10-CM | POA: Diagnosis not present

## 2022-01-08 DIAGNOSIS — M799 Soft tissue disorder, unspecified: Secondary | ICD-10-CM | POA: Diagnosis not present

## 2022-01-08 DIAGNOSIS — R2689 Other abnormalities of gait and mobility: Secondary | ICD-10-CM | POA: Diagnosis not present

## 2022-01-08 DIAGNOSIS — M6281 Muscle weakness (generalized): Secondary | ICD-10-CM | POA: Diagnosis not present

## 2022-01-11 DIAGNOSIS — R2689 Other abnormalities of gait and mobility: Secondary | ICD-10-CM | POA: Diagnosis not present

## 2022-01-11 DIAGNOSIS — M799 Soft tissue disorder, unspecified: Secondary | ICD-10-CM | POA: Diagnosis not present

## 2022-01-11 DIAGNOSIS — M6281 Muscle weakness (generalized): Secondary | ICD-10-CM | POA: Diagnosis not present

## 2022-01-13 DIAGNOSIS — H2513 Age-related nuclear cataract, bilateral: Secondary | ICD-10-CM | POA: Diagnosis not present

## 2022-01-13 DIAGNOSIS — H5203 Hypermetropia, bilateral: Secondary | ICD-10-CM | POA: Diagnosis not present

## 2022-01-13 DIAGNOSIS — H524 Presbyopia: Secondary | ICD-10-CM | POA: Diagnosis not present

## 2022-01-13 DIAGNOSIS — H52223 Regular astigmatism, bilateral: Secondary | ICD-10-CM | POA: Diagnosis not present

## 2022-01-13 DIAGNOSIS — Z0100 Encounter for examination of eyes and vision without abnormal findings: Secondary | ICD-10-CM | POA: Diagnosis not present

## 2022-01-13 DIAGNOSIS — Z7984 Long term (current) use of oral hypoglycemic drugs: Secondary | ICD-10-CM | POA: Diagnosis not present

## 2022-01-13 DIAGNOSIS — E119 Type 2 diabetes mellitus without complications: Secondary | ICD-10-CM | POA: Diagnosis not present

## 2022-01-15 ENCOUNTER — Ambulatory Visit
Admission: RE | Admit: 2022-01-15 | Discharge: 2022-01-15 | Disposition: A | Discharge status: Home / Self Care | Payer: Medicare HMO | Source: Ambulatory Visit | Attending: Internal Medicine | Admitting: Internal Medicine

## 2022-01-15 DIAGNOSIS — R2689 Other abnormalities of gait and mobility: Secondary | ICD-10-CM | POA: Diagnosis not present

## 2022-01-15 DIAGNOSIS — Z1231 Encounter for screening mammogram for malignant neoplasm of breast: Secondary | ICD-10-CM | POA: Diagnosis not present

## 2022-01-15 DIAGNOSIS — M799 Soft tissue disorder, unspecified: Secondary | ICD-10-CM | POA: Diagnosis not present

## 2022-01-15 DIAGNOSIS — M6281 Muscle weakness (generalized): Secondary | ICD-10-CM | POA: Diagnosis not present

## 2022-01-19 DIAGNOSIS — M6281 Muscle weakness (generalized): Secondary | ICD-10-CM | POA: Diagnosis not present

## 2022-01-19 DIAGNOSIS — M799 Soft tissue disorder, unspecified: Secondary | ICD-10-CM | POA: Diagnosis not present

## 2022-01-19 DIAGNOSIS — R2689 Other abnormalities of gait and mobility: Secondary | ICD-10-CM | POA: Diagnosis not present

## 2022-01-21 DIAGNOSIS — M6281 Muscle weakness (generalized): Secondary | ICD-10-CM | POA: Diagnosis not present

## 2022-01-21 DIAGNOSIS — R2689 Other abnormalities of gait and mobility: Secondary | ICD-10-CM | POA: Diagnosis not present

## 2022-01-21 DIAGNOSIS — M799 Soft tissue disorder, unspecified: Secondary | ICD-10-CM | POA: Diagnosis not present

## 2022-01-25 DIAGNOSIS — R2689 Other abnormalities of gait and mobility: Secondary | ICD-10-CM | POA: Diagnosis not present

## 2022-01-25 DIAGNOSIS — M6281 Muscle weakness (generalized): Secondary | ICD-10-CM | POA: Diagnosis not present

## 2022-01-25 DIAGNOSIS — M799 Soft tissue disorder, unspecified: Secondary | ICD-10-CM | POA: Diagnosis not present

## 2022-01-27 DIAGNOSIS — R2689 Other abnormalities of gait and mobility: Secondary | ICD-10-CM | POA: Diagnosis not present

## 2022-01-27 DIAGNOSIS — M799 Soft tissue disorder, unspecified: Secondary | ICD-10-CM | POA: Diagnosis not present

## 2022-01-27 DIAGNOSIS — M6281 Muscle weakness (generalized): Secondary | ICD-10-CM | POA: Diagnosis not present

## 2022-01-29 DIAGNOSIS — N182 Chronic kidney disease, stage 2 (mild): Secondary | ICD-10-CM | POA: Diagnosis not present

## 2022-01-29 DIAGNOSIS — E1122 Type 2 diabetes mellitus with diabetic chronic kidney disease: Secondary | ICD-10-CM | POA: Diagnosis not present

## 2022-01-29 DIAGNOSIS — I779 Disorder of arteries and arterioles, unspecified: Secondary | ICD-10-CM | POA: Diagnosis not present

## 2022-01-29 DIAGNOSIS — E1169 Type 2 diabetes mellitus with other specified complication: Secondary | ICD-10-CM | POA: Diagnosis not present

## 2022-01-29 DIAGNOSIS — I10 Essential (primary) hypertension: Secondary | ICD-10-CM | POA: Diagnosis not present

## 2022-01-29 DIAGNOSIS — Z794 Long term (current) use of insulin: Secondary | ICD-10-CM | POA: Diagnosis not present

## 2022-01-29 DIAGNOSIS — E785 Hyperlipidemia, unspecified: Secondary | ICD-10-CM | POA: Diagnosis not present

## 2022-02-01 DIAGNOSIS — M799 Soft tissue disorder, unspecified: Secondary | ICD-10-CM | POA: Diagnosis not present

## 2022-02-01 DIAGNOSIS — M6281 Muscle weakness (generalized): Secondary | ICD-10-CM | POA: Diagnosis not present

## 2022-02-01 DIAGNOSIS — R2689 Other abnormalities of gait and mobility: Secondary | ICD-10-CM | POA: Diagnosis not present

## 2022-02-05 DIAGNOSIS — R2689 Other abnormalities of gait and mobility: Secondary | ICD-10-CM | POA: Diagnosis not present

## 2022-02-05 DIAGNOSIS — M799 Soft tissue disorder, unspecified: Secondary | ICD-10-CM | POA: Diagnosis not present

## 2022-02-05 DIAGNOSIS — M6281 Muscle weakness (generalized): Secondary | ICD-10-CM | POA: Diagnosis not present

## 2022-02-09 DIAGNOSIS — M6281 Muscle weakness (generalized): Secondary | ICD-10-CM | POA: Diagnosis not present

## 2022-02-09 DIAGNOSIS — R2689 Other abnormalities of gait and mobility: Secondary | ICD-10-CM | POA: Diagnosis not present

## 2022-02-09 DIAGNOSIS — M799 Soft tissue disorder, unspecified: Secondary | ICD-10-CM | POA: Diagnosis not present

## 2022-02-11 DIAGNOSIS — M6281 Muscle weakness (generalized): Secondary | ICD-10-CM | POA: Diagnosis not present

## 2022-02-11 DIAGNOSIS — M799 Soft tissue disorder, unspecified: Secondary | ICD-10-CM | POA: Diagnosis not present

## 2022-02-11 DIAGNOSIS — R2689 Other abnormalities of gait and mobility: Secondary | ICD-10-CM | POA: Diagnosis not present

## 2022-02-15 NOTE — Progress Notes (Unsigned)
   Progress Note: Referring Physician:  Kirk Ruths, MD [facility] [place],  [place] [address]  Primary Physician:  Kirk Ruths, MD  Chief Complaint:  follow up after cervical PT  History of Present Illness: Morgan Nicholson is a 76 y.o. female was last seen by Dr. Izora Ribas on 12/31/21 for neck pain. She is also recovering from a stroke she had a year ago in October. She's had balance issues since the stroke.   She has known moderate cervical stenosis, but no signs/symptoms of cervical myelopathy or cervical radiculopathy. He felt her balance issues were likely due to her recovery from a stroke affecting her left sided frontal lobe near her primary motor system.   She was sent to PT and is here for follow up.   Prior to PT, she felt like she had a constant "crick" in her neck. This has improved with PT and pain is now more intermittent- she describes it as a tightness and stiffness. She feels like she is better and "heading in the right direction." No arm pain. No numbness, tingling, or weakness. She feels like her balance/walking is better with PT as well.   Conservative measures:  Physical therapy: currently participating in PT at [facility] therapy including regular antiinflammatories:  tylenol Injections: has not had epidural steroid injections   Past Surgery:  denies    Morgan Nicholson has no symptoms of cervical myelopathy.  Exam: There were no vitals filed for this visit. There is no height or weight on file to calculate BMI.    NEUROLOGICAL:  General: In no acute distress.   Awake, alert, oriented to person, place, and time.  Pupils equal round and reactive to light.  Facial tone is symmetric.   ROM of spine: good ROM with pain on looking to left and putting left ear to shoulder.   Palpation of spine: no posterior cervical or bilateral trapezial tenderness.     Strength: Side Biceps Triceps Deltoid Interossei  Grip Wrist Ext. Wrist Flex.  R '5 5 5 5 5 5 5  '$ L '5 5 5 5 5 5 5    '$ Reflexes are 1+ and symmetric at the biceps, triceps, brachioradialis, patella and achilles.   Hoffman's is absent.  Bilateral upper extremity sensation is intact to light touch.    She has slow gait. No cane today.     Assessment and Plan: Morgan Nicholson is a pleasant 76 y.o. female with improvement in her left sided neck pain with PT. She still has some intermittent stiffness.   She has known moderate cervical stenosis. No signs/symptoms of cervical myelopathy or cervical radiculopathy. She feels like balance has improved with PT. Dr. Izora Ribas felt balance was due to her recovery from a stroke affecting her left sided frontal lobe near her primary motor system.   Treatment options reviewed with patient and following plan made:   - Finish out PT and continue with HEP.  - Discussed further treatment options such as referral for cervical injections. She wants to hold off.  - She will f/u here prn at her request.   I spent a total of 15 minutes in both face-to-face and non-face-to-face activities for this visit on the date of this encounter including treatment options, diagnosis, and plan.   Geronimo Boot PA-C Neurosurgery

## 2022-02-16 ENCOUNTER — Ambulatory Visit (INDEPENDENT_AMBULATORY_CARE_PROVIDER_SITE_OTHER): Payer: Medicare HMO | Admitting: Orthopedic Surgery

## 2022-02-16 ENCOUNTER — Encounter: Payer: Self-pay | Admitting: Orthopedic Surgery

## 2022-02-16 VITALS — BP 147/70 | HR 65 | Ht 65.0 in | Wt 177.8 lb

## 2022-02-16 DIAGNOSIS — M542 Cervicalgia: Secondary | ICD-10-CM

## 2022-02-16 DIAGNOSIS — M4802 Spinal stenosis, cervical region: Secondary | ICD-10-CM | POA: Diagnosis not present

## 2022-02-18 DIAGNOSIS — M6281 Muscle weakness (generalized): Secondary | ICD-10-CM | POA: Diagnosis not present

## 2022-02-18 DIAGNOSIS — M799 Soft tissue disorder, unspecified: Secondary | ICD-10-CM | POA: Diagnosis not present

## 2022-02-18 DIAGNOSIS — R2689 Other abnormalities of gait and mobility: Secondary | ICD-10-CM | POA: Diagnosis not present

## 2022-02-19 DIAGNOSIS — D2271 Melanocytic nevi of right lower limb, including hip: Secondary | ICD-10-CM | POA: Diagnosis not present

## 2022-02-19 DIAGNOSIS — B078 Other viral warts: Secondary | ICD-10-CM | POA: Diagnosis not present

## 2022-02-19 DIAGNOSIS — D485 Neoplasm of uncertain behavior of skin: Secondary | ICD-10-CM | POA: Diagnosis not present

## 2022-02-19 DIAGNOSIS — D225 Melanocytic nevi of trunk: Secondary | ICD-10-CM | POA: Diagnosis not present

## 2022-02-19 DIAGNOSIS — L821 Other seborrheic keratosis: Secondary | ICD-10-CM | POA: Diagnosis not present

## 2022-02-19 DIAGNOSIS — L400 Psoriasis vulgaris: Secondary | ICD-10-CM | POA: Diagnosis not present

## 2022-02-19 DIAGNOSIS — Z85828 Personal history of other malignant neoplasm of skin: Secondary | ICD-10-CM | POA: Diagnosis not present

## 2022-03-08 DIAGNOSIS — E1165 Type 2 diabetes mellitus with hyperglycemia: Secondary | ICD-10-CM | POA: Diagnosis not present

## 2022-03-11 DIAGNOSIS — M542 Cervicalgia: Secondary | ICD-10-CM | POA: Diagnosis not present

## 2022-03-11 DIAGNOSIS — R2689 Other abnormalities of gait and mobility: Secondary | ICD-10-CM | POA: Diagnosis not present

## 2022-03-31 DIAGNOSIS — E785 Hyperlipidemia, unspecified: Secondary | ICD-10-CM | POA: Diagnosis not present

## 2022-03-31 DIAGNOSIS — I779 Disorder of arteries and arterioles, unspecified: Secondary | ICD-10-CM | POA: Diagnosis not present

## 2022-03-31 DIAGNOSIS — E1122 Type 2 diabetes mellitus with diabetic chronic kidney disease: Secondary | ICD-10-CM | POA: Diagnosis not present

## 2022-03-31 DIAGNOSIS — Z794 Long term (current) use of insulin: Secondary | ICD-10-CM | POA: Diagnosis not present

## 2022-03-31 DIAGNOSIS — N182 Chronic kidney disease, stage 2 (mild): Secondary | ICD-10-CM | POA: Diagnosis not present

## 2022-03-31 DIAGNOSIS — E1169 Type 2 diabetes mellitus with other specified complication: Secondary | ICD-10-CM | POA: Diagnosis not present

## 2022-03-31 DIAGNOSIS — I10 Essential (primary) hypertension: Secondary | ICD-10-CM | POA: Diagnosis not present

## 2022-04-07 DIAGNOSIS — Z95818 Presence of other cardiac implants and grafts: Secondary | ICD-10-CM | POA: Diagnosis not present

## 2022-04-07 DIAGNOSIS — Z794 Long term (current) use of insulin: Secondary | ICD-10-CM | POA: Diagnosis not present

## 2022-04-07 DIAGNOSIS — I129 Hypertensive chronic kidney disease with stage 1 through stage 4 chronic kidney disease, or unspecified chronic kidney disease: Secondary | ICD-10-CM | POA: Diagnosis not present

## 2022-04-07 DIAGNOSIS — N182 Chronic kidney disease, stage 2 (mild): Secondary | ICD-10-CM | POA: Diagnosis not present

## 2022-04-07 DIAGNOSIS — E785 Hyperlipidemia, unspecified: Secondary | ICD-10-CM | POA: Diagnosis not present

## 2022-04-07 DIAGNOSIS — E1122 Type 2 diabetes mellitus with diabetic chronic kidney disease: Secondary | ICD-10-CM | POA: Diagnosis not present

## 2022-04-07 DIAGNOSIS — I10 Essential (primary) hypertension: Secondary | ICD-10-CM | POA: Diagnosis not present

## 2022-04-07 DIAGNOSIS — Z8673 Personal history of transient ischemic attack (TIA), and cerebral infarction without residual deficits: Secondary | ICD-10-CM | POA: Diagnosis not present

## 2022-04-07 DIAGNOSIS — I779 Disorder of arteries and arterioles, unspecified: Secondary | ICD-10-CM | POA: Diagnosis not present

## 2022-04-07 DIAGNOSIS — M25552 Pain in left hip: Secondary | ICD-10-CM | POA: Diagnosis not present

## 2022-04-07 DIAGNOSIS — E1169 Type 2 diabetes mellitus with other specified complication: Secondary | ICD-10-CM | POA: Diagnosis not present

## 2022-04-07 DIAGNOSIS — M25551 Pain in right hip: Secondary | ICD-10-CM | POA: Diagnosis not present

## 2022-04-09 DIAGNOSIS — Z794 Long term (current) use of insulin: Secondary | ICD-10-CM | POA: Diagnosis not present

## 2022-04-09 DIAGNOSIS — I10 Essential (primary) hypertension: Secondary | ICD-10-CM | POA: Diagnosis not present

## 2022-04-09 DIAGNOSIS — I779 Disorder of arteries and arterioles, unspecified: Secondary | ICD-10-CM | POA: Diagnosis not present

## 2022-04-09 DIAGNOSIS — E1169 Type 2 diabetes mellitus with other specified complication: Secondary | ICD-10-CM | POA: Diagnosis not present

## 2022-04-09 DIAGNOSIS — E785 Hyperlipidemia, unspecified: Secondary | ICD-10-CM | POA: Diagnosis not present

## 2022-04-09 DIAGNOSIS — E1122 Type 2 diabetes mellitus with diabetic chronic kidney disease: Secondary | ICD-10-CM | POA: Diagnosis not present

## 2022-04-09 DIAGNOSIS — N182 Chronic kidney disease, stage 2 (mild): Secondary | ICD-10-CM | POA: Diagnosis not present

## 2022-04-13 DIAGNOSIS — E1169 Type 2 diabetes mellitus with other specified complication: Secondary | ICD-10-CM | POA: Diagnosis not present

## 2022-04-13 DIAGNOSIS — E785 Hyperlipidemia, unspecified: Secondary | ICD-10-CM | POA: Diagnosis not present

## 2022-04-13 DIAGNOSIS — Z794 Long term (current) use of insulin: Secondary | ICD-10-CM | POA: Diagnosis not present

## 2022-04-13 DIAGNOSIS — I152 Hypertension secondary to endocrine disorders: Secondary | ICD-10-CM | POA: Diagnosis not present

## 2022-04-13 DIAGNOSIS — E1159 Type 2 diabetes mellitus with other circulatory complications: Secondary | ICD-10-CM | POA: Diagnosis not present

## 2022-04-14 DIAGNOSIS — M166 Other bilateral secondary osteoarthritis of hip: Secondary | ICD-10-CM | POA: Diagnosis not present

## 2022-04-14 DIAGNOSIS — M25551 Pain in right hip: Secondary | ICD-10-CM | POA: Diagnosis not present

## 2022-04-14 DIAGNOSIS — M25552 Pain in left hip: Secondary | ICD-10-CM | POA: Diagnosis not present

## 2022-04-23 DIAGNOSIS — M167 Other unilateral secondary osteoarthritis of hip: Secondary | ICD-10-CM | POA: Diagnosis not present

## 2022-05-13 DIAGNOSIS — I779 Disorder of arteries and arterioles, unspecified: Secondary | ICD-10-CM | POA: Diagnosis not present

## 2022-05-13 DIAGNOSIS — E1122 Type 2 diabetes mellitus with diabetic chronic kidney disease: Secondary | ICD-10-CM | POA: Diagnosis not present

## 2022-05-13 DIAGNOSIS — I10 Essential (primary) hypertension: Secondary | ICD-10-CM | POA: Diagnosis not present

## 2022-05-13 DIAGNOSIS — E1169 Type 2 diabetes mellitus with other specified complication: Secondary | ICD-10-CM | POA: Diagnosis not present

## 2022-05-13 DIAGNOSIS — Z794 Long term (current) use of insulin: Secondary | ICD-10-CM | POA: Diagnosis not present

## 2022-05-13 DIAGNOSIS — E785 Hyperlipidemia, unspecified: Secondary | ICD-10-CM | POA: Diagnosis not present

## 2022-05-13 DIAGNOSIS — N182 Chronic kidney disease, stage 2 (mild): Secondary | ICD-10-CM | POA: Diagnosis not present

## 2022-05-26 DIAGNOSIS — E785 Hyperlipidemia, unspecified: Secondary | ICD-10-CM | POA: Diagnosis not present

## 2022-05-26 DIAGNOSIS — E1159 Type 2 diabetes mellitus with other circulatory complications: Secondary | ICD-10-CM | POA: Diagnosis not present

## 2022-05-26 DIAGNOSIS — I152 Hypertension secondary to endocrine disorders: Secondary | ICD-10-CM | POA: Diagnosis not present

## 2022-05-26 DIAGNOSIS — Z794 Long term (current) use of insulin: Secondary | ICD-10-CM | POA: Diagnosis not present

## 2022-05-26 DIAGNOSIS — E1169 Type 2 diabetes mellitus with other specified complication: Secondary | ICD-10-CM | POA: Diagnosis not present

## 2022-05-26 DIAGNOSIS — Z8673 Personal history of transient ischemic attack (TIA), and cerebral infarction without residual deficits: Secondary | ICD-10-CM | POA: Diagnosis not present

## 2022-06-06 DIAGNOSIS — E1165 Type 2 diabetes mellitus with hyperglycemia: Secondary | ICD-10-CM | POA: Diagnosis not present

## 2022-06-11 DIAGNOSIS — N182 Chronic kidney disease, stage 2 (mild): Secondary | ICD-10-CM | POA: Diagnosis not present

## 2022-06-11 DIAGNOSIS — E1169 Type 2 diabetes mellitus with other specified complication: Secondary | ICD-10-CM | POA: Diagnosis not present

## 2022-06-11 DIAGNOSIS — E785 Hyperlipidemia, unspecified: Secondary | ICD-10-CM | POA: Diagnosis not present

## 2022-06-11 DIAGNOSIS — I779 Disorder of arteries and arterioles, unspecified: Secondary | ICD-10-CM | POA: Diagnosis not present

## 2022-06-11 DIAGNOSIS — E1122 Type 2 diabetes mellitus with diabetic chronic kidney disease: Secondary | ICD-10-CM | POA: Diagnosis not present

## 2022-06-11 DIAGNOSIS — I10 Essential (primary) hypertension: Secondary | ICD-10-CM | POA: Diagnosis not present

## 2022-06-11 DIAGNOSIS — Z794 Long term (current) use of insulin: Secondary | ICD-10-CM | POA: Diagnosis not present

## 2022-07-28 DIAGNOSIS — E1169 Type 2 diabetes mellitus with other specified complication: Secondary | ICD-10-CM | POA: Diagnosis not present

## 2022-07-28 DIAGNOSIS — E1122 Type 2 diabetes mellitus with diabetic chronic kidney disease: Secondary | ICD-10-CM | POA: Diagnosis not present

## 2022-07-28 DIAGNOSIS — Z794 Long term (current) use of insulin: Secondary | ICD-10-CM | POA: Diagnosis not present

## 2022-07-28 DIAGNOSIS — E785 Hyperlipidemia, unspecified: Secondary | ICD-10-CM | POA: Diagnosis not present

## 2022-07-28 DIAGNOSIS — N182 Chronic kidney disease, stage 2 (mild): Secondary | ICD-10-CM | POA: Diagnosis not present

## 2022-07-28 DIAGNOSIS — I10 Essential (primary) hypertension: Secondary | ICD-10-CM | POA: Diagnosis not present

## 2022-08-04 DIAGNOSIS — E1122 Type 2 diabetes mellitus with diabetic chronic kidney disease: Secondary | ICD-10-CM | POA: Diagnosis not present

## 2022-08-04 DIAGNOSIS — E1169 Type 2 diabetes mellitus with other specified complication: Secondary | ICD-10-CM | POA: Diagnosis not present

## 2022-08-04 DIAGNOSIS — Z794 Long term (current) use of insulin: Secondary | ICD-10-CM | POA: Diagnosis not present

## 2022-08-04 DIAGNOSIS — E785 Hyperlipidemia, unspecified: Secondary | ICD-10-CM | POA: Diagnosis not present

## 2022-08-04 DIAGNOSIS — Z8673 Personal history of transient ischemic attack (TIA), and cerebral infarction without residual deficits: Secondary | ICD-10-CM | POA: Diagnosis not present

## 2022-08-04 DIAGNOSIS — Z1331 Encounter for screening for depression: Secondary | ICD-10-CM | POA: Diagnosis not present

## 2022-08-04 DIAGNOSIS — N182 Chronic kidney disease, stage 2 (mild): Secondary | ICD-10-CM | POA: Diagnosis not present

## 2022-08-04 DIAGNOSIS — E1159 Type 2 diabetes mellitus with other circulatory complications: Secondary | ICD-10-CM | POA: Diagnosis not present

## 2022-08-04 DIAGNOSIS — I779 Disorder of arteries and arterioles, unspecified: Secondary | ICD-10-CM | POA: Diagnosis not present

## 2022-08-04 DIAGNOSIS — I152 Hypertension secondary to endocrine disorders: Secondary | ICD-10-CM | POA: Diagnosis not present

## 2022-08-04 DIAGNOSIS — I129 Hypertensive chronic kidney disease with stage 1 through stage 4 chronic kidney disease, or unspecified chronic kidney disease: Secondary | ICD-10-CM | POA: Diagnosis not present

## 2022-08-04 DIAGNOSIS — Z0000 Encounter for general adult medical examination without abnormal findings: Secondary | ICD-10-CM | POA: Diagnosis not present

## 2022-08-09 DIAGNOSIS — M1611 Unilateral primary osteoarthritis, right hip: Secondary | ICD-10-CM | POA: Diagnosis not present

## 2022-08-10 DIAGNOSIS — I10 Essential (primary) hypertension: Secondary | ICD-10-CM | POA: Diagnosis not present

## 2022-08-10 DIAGNOSIS — E1122 Type 2 diabetes mellitus with diabetic chronic kidney disease: Secondary | ICD-10-CM | POA: Diagnosis not present

## 2022-08-10 DIAGNOSIS — E1169 Type 2 diabetes mellitus with other specified complication: Secondary | ICD-10-CM | POA: Diagnosis not present

## 2022-08-10 DIAGNOSIS — N182 Chronic kidney disease, stage 2 (mild): Secondary | ICD-10-CM | POA: Diagnosis not present

## 2022-08-10 DIAGNOSIS — I779 Disorder of arteries and arterioles, unspecified: Secondary | ICD-10-CM | POA: Diagnosis not present

## 2022-08-10 DIAGNOSIS — Z794 Long term (current) use of insulin: Secondary | ICD-10-CM | POA: Diagnosis not present

## 2022-08-10 DIAGNOSIS — E785 Hyperlipidemia, unspecified: Secondary | ICD-10-CM | POA: Diagnosis not present

## 2022-08-23 DIAGNOSIS — W010XXA Fall on same level from slipping, tripping and stumbling without subsequent striking against object, initial encounter: Secondary | ICD-10-CM | POA: Diagnosis not present

## 2022-08-23 DIAGNOSIS — Y92009 Unspecified place in unspecified non-institutional (private) residence as the place of occurrence of the external cause: Secondary | ICD-10-CM | POA: Diagnosis not present

## 2022-08-23 DIAGNOSIS — R0789 Other chest pain: Secondary | ICD-10-CM | POA: Diagnosis not present

## 2022-09-04 DIAGNOSIS — E1165 Type 2 diabetes mellitus with hyperglycemia: Secondary | ICD-10-CM | POA: Diagnosis not present

## 2022-09-29 DIAGNOSIS — D385 Neoplasm of uncertain behavior of other respiratory organs: Secondary | ICD-10-CM | POA: Diagnosis not present

## 2022-09-30 ENCOUNTER — Other Ambulatory Visit: Payer: Self-pay | Admitting: Otolaryngology

## 2022-09-30 DIAGNOSIS — D385 Neoplasm of uncertain behavior of other respiratory organs: Secondary | ICD-10-CM

## 2022-09-30 DIAGNOSIS — M1611 Unilateral primary osteoarthritis, right hip: Secondary | ICD-10-CM | POA: Diagnosis not present

## 2022-10-02 DIAGNOSIS — M1611 Unilateral primary osteoarthritis, right hip: Principal | ICD-10-CM | POA: Insufficient documentation

## 2022-10-04 ENCOUNTER — Ambulatory Visit
Admission: RE | Admit: 2022-10-04 | Discharge: 2022-10-04 | Disposition: A | Discharge status: Home / Self Care | Payer: Medicare HMO | Source: Ambulatory Visit | Attending: Otolaryngology | Admitting: Otolaryngology

## 2022-10-04 DIAGNOSIS — D385 Neoplasm of uncertain behavior of other respiratory organs: Secondary | ICD-10-CM

## 2022-10-04 DIAGNOSIS — C319 Malignant neoplasm of accessory sinus, unspecified: Secondary | ICD-10-CM | POA: Diagnosis not present

## 2022-10-20 NOTE — Discharge Instructions (Addendum)
Instructions after Total Hip Replacement   James P. Angie Fava., M.D.    Dept. of Orthopaedics & Sports Medicine [facility] [address] [place], [place]  [address]  Phone: [phone]   Fax: [phone]        [email or web address]        DIET: Drink plenty of non-alcoholic fluids. Resume your normal diet. Include foods high in fiber.  ACTIVITY:  You may use crutches or a walker with weight-bearing as tolerated, unless instructed otherwise. You may be weaned off of the walker or crutches by your Physical Therapist.  Do NOT reach below the level of your knees or cross your legs until allowed.    Continue doing gentle exercises. Exercising will reduce the pain and swelling, increase motion, and prevent muscle weakness.   Please continue to use the TED compression stockings for 6 weeks. You may remove the stockings at night, but should reapply them in the morning. Do not drive or operate any equipment until instructed.  WOUND CARE:  Continue to use ice packs periodically to reduce pain and swelling. Keep the incision clean and dry. You may bathe or shower after the staples are removed at the first office visit following surgery. The Aquacel bandage remains in place for 7 days postoperatively.  This can be removed and replaced with a honeycomb dressing after this timeframe.  At home PT can help with this.  MEDICATIONS: You may resume your regular medications. Please take the pain medication as prescribed on the medication. Do not take pain medication on an empty stomach. You have been given a prescription for a blood thinner  -aspirin 81 mg.  Please take one 81 mg aspirin twice a day for DVT prophylaxis. Pain medications and iron supplements can cause constipation. Use a stool softener (Senokot or Colace) on a daily basis and a laxative (dulcolax or miralax) as needed. Do not drive or drink alcoholic beverages when taking pain medications.  CALL THE OFFICE  FOR: Temperature above 101 degrees Excessive bleeding or drainage on the dressing. Excessive swelling, coldness, or paleness of the toes. Persistent nausea and vomiting.  FOLLOW-UP:  You should have an appointment to return to the office in 6 weeks after surgery. Arrangements have been made for continuation of Physical Therapy (either home therapy or outpatient therapy).     [facility] Department Directory         [email or web address]       [email or web address]          Cardiology  Appointments: [place] - [phone]  Endocrinology  Appointments: [place] [phone] [place] - [phone]  Gastroenterology  Appointments: [place] [phone] [place] - [phone]        General Surgery   Appointments: [facility]  Internal Medicine/Family Medicine  Appointments: [facility] [place] - [phone] [place] - [phone]  Metabolic and Weigh Loss Surgery  Appointments: [facility]        Neurology  Appointments: [place] [phone] [place] - [phone]  Neurosurgery  Appointments: [place]  Obstetrics & Gynecology  Appointments: [place] [phone] [place] - [phone]        Pediatrics  Appointments: [place] [phone] [place] - [phone]  Physiatry  Appointments: [place] [phone]  Physical Therapy  Appointments: [place] - [phone]        Podiatry  Appointments: [place] [phone] [place] - [phone]  Pulmonology  Appointments: [place]  Rheumatology  Appointments: [place] [phone]  [place] Location: [facility]  [address] [place], [place]  [address]  [place] Location: [facility]. [address] [place], [place]  [address]  [place] Location: [facility] [address] [place], [place]   [address]

## 2022-10-22 ENCOUNTER — Other Ambulatory Visit: Payer: Self-pay

## 2022-10-22 ENCOUNTER — Encounter: Payer: Self-pay | Admitting: Orthopedic Surgery

## 2022-10-22 ENCOUNTER — Encounter
Admission: RE | Admit: 2022-10-22 | Discharge: 2022-10-22 | Disposition: A | Discharge status: Home / Self Care | Payer: Medicare HMO | Source: Ambulatory Visit | Attending: Orthopedic Surgery | Admitting: Orthopedic Surgery

## 2022-10-22 VITALS — BP 141/71 | HR 60 | Resp 16 | Ht 65.0 in | Wt 158.0 lb

## 2022-10-22 DIAGNOSIS — Z01810 Encounter for preprocedural cardiovascular examination: Secondary | ICD-10-CM | POA: Diagnosis not present

## 2022-10-22 DIAGNOSIS — M1611 Unilateral primary osteoarthritis, right hip: Secondary | ICD-10-CM | POA: Insufficient documentation

## 2022-10-22 DIAGNOSIS — Z01818 Encounter for other preprocedural examination: Secondary | ICD-10-CM | POA: Insufficient documentation

## 2022-10-22 DIAGNOSIS — E1165 Type 2 diabetes mellitus with hyperglycemia: Secondary | ICD-10-CM | POA: Diagnosis not present

## 2022-10-22 DIAGNOSIS — R8290 Unspecified abnormal findings in urine: Secondary | ICD-10-CM | POA: Insufficient documentation

## 2022-10-22 DIAGNOSIS — Z794 Long term (current) use of insulin: Secondary | ICD-10-CM | POA: Diagnosis not present

## 2022-10-22 DIAGNOSIS — R8281 Pyuria: Secondary | ICD-10-CM | POA: Diagnosis not present

## 2022-10-22 DIAGNOSIS — E1122 Type 2 diabetes mellitus with diabetic chronic kidney disease: Secondary | ICD-10-CM | POA: Diagnosis not present

## 2022-10-22 DIAGNOSIS — N1831 Chronic kidney disease, stage 3a: Secondary | ICD-10-CM | POA: Insufficient documentation

## 2022-10-22 DIAGNOSIS — Z01812 Encounter for preprocedural laboratory examination: Secondary | ICD-10-CM

## 2022-10-22 HISTORY — DX: Pneumonia, unspecified organism: J18.9

## 2022-10-22 LAB — COMPREHENSIVE METABOLIC PANEL
ALT: 18 U/L (ref 0–44)
AST: 24 U/L (ref 15–41)
Albumin: 4.2 g/dL (ref 3.5–5.0)
Alkaline Phosphatase: 51 U/L (ref 38–126)
Anion gap: 8 (ref 5–15)
BUN: 28 mg/dL — ABNORMAL HIGH (ref 8–23)
CO2: 25 mmol/L (ref 22–32)
Calcium: 9.8 mg/dL (ref 8.9–10.3)
Chloride: 102 mmol/L (ref 98–111)
Creatinine, Ser: 0.87 mg/dL (ref 0.44–1.00)
GFR, Estimated: >60 mL/min (ref >60)
Glucose, Bld: 176 mg/dL — ABNORMAL HIGH (ref 70–99)
Potassium: 4.2 mmol/L (ref 3.5–5.1)
Sodium: 135 mmol/L (ref 135–145)
Total Bilirubin: 0.8 mg/dL (ref 0.3–1.2)
Total Protein: 7.1 g/dL (ref 6.5–8.1)

## 2022-10-22 LAB — URINALYSIS, ROUTINE W REFLEX MICROSCOPIC
Bilirubin Urine: NEGATIVE
Glucose, UA: 150 mg/dL — AB
Hgb urine dipstick: NEGATIVE
Ketones, ur: 5 mg/dL — AB
Nitrite: NEGATIVE
Protein, ur: 30 mg/dL — AB
Specific Gravity, Urine: 1.019 (ref 1.005–1.030)
pH: 5 (ref 5.0–8.0)

## 2022-10-22 LAB — CBC
HCT: 42.3 % (ref 36.0–46.0)
Hemoglobin: 14.3 g/dL (ref 12.0–15.0)
MCH: 30.8 pg (ref 26.0–34.0)
MCHC: 33.8 g/dL (ref 30.0–36.0)
MCV: 91.2 fL (ref 80.0–100.0)
Platelets: 197 10*3/uL (ref 150–400)
RBC: 4.64 MIL/uL (ref 3.87–5.11)
RDW: 13.2 % (ref 11.5–15.5)
WBC: 8.9 10*3/uL (ref 4.0–10.5)
nRBC: 0 % (ref 0.0–0.2)

## 2022-10-22 LAB — SEDIMENTATION RATE: Sed Rate: 6 mm/hr (ref 0–30)

## 2022-10-22 LAB — TYPE AND SCREEN
ABO/RH(D): A POS
Antibody Screen: NEGATIVE

## 2022-10-22 LAB — SURGICAL PCR SCREEN
MRSA, PCR: NEGATIVE
Staphylococcus aureus: POSITIVE — AB

## 2022-10-22 LAB — C-REACTIVE PROTEIN: CRP: 0.5 mg/dL (ref <1.0)

## 2022-10-22 NOTE — Patient Instructions (Addendum)
Your procedure is scheduled on: 11/01/22 - Monday Report to the Registration Desk on the 1st floor of the [facility]. To find out your arrival time, please call [phone] between 1PM - 3PM on: 10/29/22 - Friday If your arrival time is 6:00 am, do not arrive before that time as the [facility] entrance doors do not open until 6:00 am.  REMEMBER: Instructions that are not followed completely may result in serious medical risk, up to and including death; or upon the discretion of your surgeon and anesthesiologist your surgery may need to be rescheduled.  Do not eat food after midnight the night before surgery.  No gum chewing or hard candies.  You may water up to 2 hours before you are scheduled to arrive for your surgery. Do not drink anything within 2 hours of your scheduled arrival time.  In addition, your doctor has ordered for you to drink the provided:  Gatorade G2 Drinking this carbohydrate drink up to two hours before surgery helps to reduce insulin resistance and improve patient outcomes. Please complete drinking 2 hours before scheduled arrival time.  One week prior to surgery beginning 10/25/22. Stop Anti-inflammatories (NSAIDS) such as Advil, Aleve, Ibuprofen, Motrin, Naproxen, Naprosyn and Aspirin based products such as Excedrin, Goody's Powder, BC Powder. You may however, continue to take Tylenol if needed for pain up until the day of surgery.  Stop beginning 10/25/22, ANY OVER THE COUNTER supplements until after surgery :CALCIUM 600+D3    Continue taking all prescribed medications with the exception of the following:  metFORMIN (GLUCOPHAGE) hold beginning 10/30/22. Glargine-Lixisenatide (SOLIQUA [place])  hold your morning dose on insulin on the morning of surgery.    TAKE ONLY THESE MEDICATIONS THE MORNING OF SURGERY WITH A SIP OF WATER:   carvedilol (COREG)  oxybutynin (DITROPAN)    No Alcohol for 24 hours before or after surgery.  No Smoking including  e-cigarettes for 24 hours before surgery.  No chewable tobacco products for at least 6 hours before surgery.  No nicotine patches on the day of surgery.  Do not use any "recreational" drugs for at least a week (preferably 2 weeks) before your surgery.  Please be advised that the combination of cocaine and anesthesia may have negative outcomes, up to and including death. If you test positive for cocaine, your surgery will be cancelled.  On the morning of surgery brush your teeth with toothpaste and water, you may rinse your mouth with mouthwash if you wish. Do not swallow any toothpaste or mouthwash.  Use CHG Soap or wipes as directed on instruction sheet.  Do not wear jewelry, make-up, hairpins, clips or nail polish.  Do not wear lotions, powders, or perfumes.   Contact lenses, hearing aids and dentures may not be worn into surgery.  Do not bring valuables to the hospital. [facility] is not responsible for any missing/lost belongings or valuables.   Notify your doctor if there is any change in your medical condition (cold, fever, infection).  Wear comfortable clothing (specific to your surgery type) to the hospital.  After surgery, you can help prevent lung complications by doing breathing exercises.  Take deep breaths and cough every 1-2 hours. Your doctor may order a device called an Incentive Spirometer to help you take deep breaths. When coughing or sneezing, hold a pillow firmly against your incision with both hands. This is called "splinting." Doing this helps protect your incision. It also decreases belly discomfort.  If you are being admitted to the hospital  overnight, leave your suitcase in the car. After surgery it may be brought to your room.  In case of increased patient census, it may be necessary for you, the patient, to continue your postoperative care in the Same Day Surgery department.  If you are being discharged the day of surgery, you will not be allowed to drive  home. You will need a responsible individual to drive you home and stay with you for 24 hours after surgery.   If you are taking public transportation, you will need to have a responsible individual with you.  Please call the Pre-admissions Testing Dept. at [phone] if you have any questions about these instructions.  Surgery Visitation Policy:  Patients having surgery or a procedure may have two visitors.  Children under the age of 52 must have an adult with them who is not the patient.  Inpatient Visitation:    Visiting hours are 7 a.m. to 8 p.m. Up to four visitors are allowed at one time in a patient room. The visitors may rotate out with other people during the day.  One visitor age 58 or older may stay with the patient overnight and must be in the room by 8 p.m.     Pre-operative 5 CHG Bath Instructions   You can play a key role in reducing the risk of infection after surgery. Your skin needs to be as free of germs as possible. You can reduce the number of germs on your skin by washing with CHG (chlorhexidine gluconate) soap before surgery. CHG is an antiseptic soap that kills germs and continues to kill germs even after washing.   DO NOT use if you have an allergy to chlorhexidine/CHG or antibacterial soaps. If your skin becomes reddened or irritated, stop using the CHG and notify one of our RNs at [phone].   Please shower with the CHG soap starting 4 days before surgery using the following schedule: 07/18 - 07/22.    Please keep in mind the following:  DO NOT shave, including legs and underarms, starting the day of your first shower.   You may shave your face at any point before/day of surgery.  Place clean sheets on your bed the day you start using CHG soap. Use a clean washcloth (not used since being washed) for each shower. DO NOT sleep with pets once you start using the CHG.   CHG Shower Instructions:  If you choose to wash your hair and private area,  wash first with your normal shampoo/soap.  After you use shampoo/soap, rinse your hair and body thoroughly to remove shampoo/soap residue.  Turn the water OFF and apply about 3 tablespoons (45 ml) of CHG soap to a CLEAN washcloth.  Apply CHG soap ONLY FROM YOUR NECK DOWN TO YOUR TOES (washing for 3-5 minutes)  DO NOT use CHG soap on face, private areas, open wounds, or sores.  Pay special attention to the area where your surgery is being performed.  If you are having back surgery, having someone wash your back for you may be helpful. Wait 2 minutes after CHG soap is applied, then you may rinse off the CHG soap.  Pat dry with a clean towel  Put on clean clothes/pajamas   If you choose to wear lotion, please use ONLY the CHG-compatible lotions on the back of this paper.     Additional instructions for the day of surgery: DO NOT APPLY any lotions, deodorants, cologne, or perfumes.   Put on clean/comfortable clothes.  Brush your teeth.  Ask your nurse before applying any prescription medications to the skin.      CHG Compatible Lotions   Aveeno Moisturizing lotion  Cetaphil Moisturizing Cream  Cetaphil Moisturizing Lotion  Clairol Herbal Essence Moisturizing Lotion, Dry Skin  Clairol Herbal Essence Moisturizing Lotion, Extra Dry Skin  Clairol Herbal Essence Moisturizing Lotion, Normal Skin  Curel Age Defying Therapeutic Moisturizing Lotion with Alpha Hydroxy  Curel Extreme Care Body Lotion  Curel Soothing Hands Moisturizing Hand Lotion  Curel Therapeutic Moisturizing Cream, Fragrance-Free  Curel Therapeutic Moisturizing Lotion, Fragrance-Free  Curel Therapeutic Moisturizing Lotion, Original Formula  Eucerin Daily Replenishing Lotion  Eucerin Dry Skin Therapy Plus Alpha Hydroxy Crme  Eucerin Dry Skin Therapy Plus Alpha Hydroxy Lotion  Eucerin Original Crme  Eucerin Original Lotion  Eucerin Plus Crme Eucerin Plus Lotion  Eucerin TriLipid Replenishing Lotion  Keri  Anti-Bacterial Hand Lotion  Keri Deep Conditioning Original Lotion Dry Skin Formula Softly Scented  Keri Deep Conditioning Original Lotion, Fragrance Free Sensitive Skin Formula  Keri Lotion Fast Absorbing Fragrance Free Sensitive Skin Formula  Keri Lotion Fast Absorbing Softly Scented Dry Skin Formula  Keri Original Lotion  Keri Skin Renewal Lotion Keri Silky Smooth Lotion  Keri Silky Smooth Sensitive Skin Lotion  Nivea Body Creamy Conditioning Oil  Nivea Body Extra Enriched Lotion  Nivea Body Original Lotion  Nivea Body Sheer Moisturizing Lotion Nivea Crme  Nivea Skin Firming Lotion  NutraDerm 30 Skin Lotion  NutraDerm Skin Lotion  NutraDerm Therapeutic Skin Cream  NutraDerm Therapeutic Skin Lotion  ProShield Protective Hand Cream  Provon moisturizing lotion  How to Use an Incentive Spirometer  An incentive spirometer is a tool that measures how well you are filling your lungs with each breath. Learning to take long, deep breaths using this tool can help you keep your lungs clear and active. This may help to reverse or lessen your chance of developing breathing (pulmonary) problems, especially infection. You may be asked to use a spirometer: After a surgery. If you have a lung problem or a history of smoking. After a long period of time when you have been unable to move or be active. If the spirometer includes an indicator to show the highest number that you have reached, your health care provider or respiratory therapist will help you set a goal. Keep a log of your progress as told by your health care provider. What are the risks? Breathing too quickly may cause dizziness or cause you to pass out. Take your time so you do not get dizzy or light-headed. If you are in pain, you may need to take pain medicine before doing incentive spirometry. It is harder to take a deep breath if you are having pain. How to use your incentive spirometer  Sit up on the edge of your bed or on a  chair. Hold the incentive spirometer so that it is in an upright position. Before you use the spirometer, breathe out normally. Place the mouthpiece in your mouth. Make sure your lips are closed tightly around it. Breathe in slowly and as deeply as you can through your mouth, causing the piston or the ball to rise toward the top of the chamber. Hold your breath for 3-5 seconds, or for as long as possible. If the spirometer includes a coach indicator, use this to guide you in breathing. Slow down your breathing if the indicator goes above the marked areas. Remove the mouthpiece from your mouth and breathe out normally. The piston or ball  will return to the bottom of the chamber. Rest for a few seconds, then repeat the steps 10 or more times. Take your time and take a few normal breaths between deep breaths so that you do not get dizzy or light-headed. Do this every 1-2 hours when you are awake. If the spirometer includes a goal marker to show the highest number you have reached (best effort), use this as a goal to work toward during each repetition. After each set of 10 deep breaths, cough a few times. This will help to make sure that your lungs are clear. If you have an incision on your chest or abdomen from surgery, place a pillow or a rolled-up towel firmly against the incision when you cough. This can help to reduce pain while taking deep breaths and coughing. General tips When you are able to get out of bed: Walk around often. Continue to take deep breaths and cough in order to clear your lungs. Keep using the incentive spirometer until your health care provider says it is okay to stop using it. If you have been in the hospital, you may be told to keep using the spirometer at home. Contact a health care provider if: You are having difficulty using the spirometer. You have trouble using the spirometer as often as instructed. Your pain medicine is not giving enough relief for you to use the  spirometer as told. You have a fever. Get help right away if: You develop shortness of breath. You develop a cough with bloody mucus from the lungs. You have fluid or blood coming from an incision site after you cough. Summary An incentive spirometer is a tool that can help you learn to take long, deep breaths to keep your lungs clear and active. You may be asked to use a spirometer after a surgery, if you have a lung problem or a history of smoking, or if you have been inactive for a long period of time. Use your incentive spirometer as instructed every 1-2 hours while you are awake. If you have an incision on your chest or abdomen, place a pillow or a rolled-up towel firmly against your incision when you cough. This will help to reduce pain. Get help right away if you have shortness of breath, you cough up bloody mucus, or blood comes from your incision when you cough. This information is not intended to replace advice given to you by your health care provider. Make sure you discuss any questions you have with your health care provider. Document Revised: 06/18/2019 Document Reviewed: 06/18/2019 Elsevier Patient Education  2023 Elsevier Inc.  Preoperative Educational Videos for Total Hip, Knee and Shoulder Replacements  To better prepare for surgery, please view our videos that explain the physical activity and discharge planning required to have the best surgical recovery at [facility].  [email or web address]  Questions? Call [phone] or email [email or web address]

## 2022-10-23 LAB — HEMOGLOBIN A1C
Hgb A1c MFr Bld: 6.8 % — ABNORMAL HIGH (ref 4.8–5.6)
Mean Plasma Glucose: 148 mg/dL

## 2022-10-24 LAB — URINE CULTURE: Culture: <10000 — AB

## 2022-10-26 DIAGNOSIS — M1611 Unilateral primary osteoarthritis, right hip: Secondary | ICD-10-CM | POA: Diagnosis not present

## 2022-11-01 ENCOUNTER — Observation Stay: Payer: Medicare HMO

## 2022-11-01 ENCOUNTER — Observation Stay
Admission: RE | Admit: 2022-11-01 | Discharge: 2022-11-02 | Disposition: A | Discharge status: Home / Self Care | Payer: Medicare HMO | Attending: Orthopedic Surgery | Admitting: Orthopedic Surgery

## 2022-11-01 ENCOUNTER — Ambulatory Visit: Payer: Medicare HMO | Admitting: Urgent Care

## 2022-11-01 ENCOUNTER — Other Ambulatory Visit: Payer: Self-pay

## 2022-11-01 ENCOUNTER — Encounter: Payer: Self-pay | Admitting: Orthopedic Surgery

## 2022-11-01 ENCOUNTER — Encounter
Admission: RE | Disposition: A | Discharge status: Home / Self Care | Payer: Self-pay | Source: Home / Self Care | Attending: Orthopedic Surgery

## 2022-11-01 DIAGNOSIS — N182 Chronic kidney disease, stage 2 (mild): Secondary | ICD-10-CM | POA: Diagnosis not present

## 2022-11-01 DIAGNOSIS — Z96641 Presence of right artificial hip joint: Secondary | ICD-10-CM | POA: Diagnosis not present

## 2022-11-01 DIAGNOSIS — E1122 Type 2 diabetes mellitus with diabetic chronic kidney disease: Secondary | ICD-10-CM | POA: Diagnosis not present

## 2022-11-01 DIAGNOSIS — Z01812 Encounter for preprocedural laboratory examination: Secondary | ICD-10-CM

## 2022-11-01 DIAGNOSIS — N1831 Chronic kidney disease, stage 3a: Secondary | ICD-10-CM | POA: Diagnosis not present

## 2022-11-01 DIAGNOSIS — E1165 Type 2 diabetes mellitus with hyperglycemia: Secondary | ICD-10-CM

## 2022-11-01 DIAGNOSIS — Z7984 Long term (current) use of oral hypoglycemic drugs: Secondary | ICD-10-CM | POA: Insufficient documentation

## 2022-11-01 DIAGNOSIS — Z8673 Personal history of transient ischemic attack (TIA), and cerebral infarction without residual deficits: Secondary | ICD-10-CM | POA: Insufficient documentation

## 2022-11-01 DIAGNOSIS — M1611 Unilateral primary osteoarthritis, right hip: Principal | ICD-10-CM | POA: Insufficient documentation

## 2022-11-01 DIAGNOSIS — I509 Heart failure, unspecified: Secondary | ICD-10-CM | POA: Diagnosis not present

## 2022-11-01 DIAGNOSIS — I129 Hypertensive chronic kidney disease with stage 1 through stage 4 chronic kidney disease, or unspecified chronic kidney disease: Secondary | ICD-10-CM | POA: Diagnosis not present

## 2022-11-01 DIAGNOSIS — R8281 Pyuria: Secondary | ICD-10-CM

## 2022-11-01 DIAGNOSIS — I130 Hypertensive heart and chronic kidney disease with heart failure and stage 1 through stage 4 chronic kidney disease, or unspecified chronic kidney disease: Secondary | ICD-10-CM | POA: Diagnosis not present

## 2022-11-01 DIAGNOSIS — R8290 Unspecified abnormal findings in urine: Secondary | ICD-10-CM

## 2022-11-01 DIAGNOSIS — Z79899 Other long term (current) drug therapy: Secondary | ICD-10-CM | POA: Diagnosis not present

## 2022-11-01 DIAGNOSIS — Z794 Long term (current) use of insulin: Secondary | ICD-10-CM | POA: Diagnosis not present

## 2022-11-01 DIAGNOSIS — Z7982 Long term (current) use of aspirin: Secondary | ICD-10-CM | POA: Insufficient documentation

## 2022-11-01 HISTORY — PX: TOTAL HIP ARTHROPLASTY: SHX124

## 2022-11-01 HISTORY — DX: Polyp of colon: K63.5

## 2022-11-01 HISTORY — DX: Unspecified malignant neoplasm of skin, unspecified: C44.90

## 2022-11-01 HISTORY — DX: Unspecified cataract: H26.9

## 2022-11-01 HISTORY — DX: Type 2 diabetes mellitus without complications: E11.9

## 2022-11-01 HISTORY — DX: Tinnitus, unspecified ear: H93.19

## 2022-11-01 HISTORY — DX: Spinal stenosis, cervical region: M48.02

## 2022-11-01 HISTORY — DX: Chronic kidney disease, stage 3 unspecified: N18.30

## 2022-11-01 HISTORY — DX: Bilateral primary osteoarthritis of hip: M16.0

## 2022-11-01 HISTORY — DX: Atherosclerosis of aorta: I70.0

## 2022-11-01 HISTORY — DX: Dizziness and giddiness: R42

## 2022-11-01 HISTORY — DX: Overactive bladder: N32.81

## 2022-11-01 HISTORY — DX: Obstructive sleep apnea (adult) (pediatric): G47.33

## 2022-11-01 LAB — GLUCOSE, CAPILLARY
Glucose-Capillary: 219 mg/dL — ABNORMAL HIGH (ref 70–99)
Glucose-Capillary: 281 mg/dL — ABNORMAL HIGH (ref 70–99)
Glucose-Capillary: 267 mg/dL — ABNORMAL HIGH (ref 70–99)
Glucose-Capillary: 270 mg/dL — ABNORMAL HIGH (ref 70–99)

## 2022-11-01 LAB — ABO/RH: ABO/RH(D): A POS

## 2022-11-01 SURGERY — ARTHROPLASTY, HIP, TOTAL,POSTERIOR APPROACH
Anesthesia: Spinal | Site: Hip | Laterality: Right

## 2022-11-01 MED ORDER — OXYBUTYNIN CHLORIDE 5 MG PO TABS
ORAL_TABLET | ORAL | Status: AC
  Filled 2022-11-01: qty 1

## 2022-11-01 MED ORDER — ACETAMINOPHEN 10 MG/ML IV SOLN
INTRAVENOUS | Status: AC
  Filled 2022-11-01: qty 100

## 2022-11-01 MED ORDER — FLEET ENEMA 7-19 GM/118ML RE ENEM: 1.0000 | ENEMA | Freq: Once | RECTAL | Status: DC | PRN

## 2022-11-01 MED ORDER — ONDANSETRON HCL 4 MG PO TABS: 4.0000 mg | ORAL_TABLET | Freq: Four times a day (QID) | ORAL | Status: DC | PRN

## 2022-11-01 MED ORDER — SENNOSIDES-DOCUSATE SODIUM 8.6-50 MG PO TABS
ORAL_TABLET | ORAL | Status: AC
  Filled 2022-11-01: qty 1

## 2022-11-01 MED ORDER — SODIUM CHLORIDE 0.9 % IV SOLN: INTRAVENOUS | Status: DC

## 2022-11-01 MED ORDER — OXYCODONE HCL 5 MG PO TABS
ORAL_TABLET | ORAL | Status: AC
  Filled 2022-11-01: qty 1

## 2022-11-01 MED ORDER — EPHEDRINE SULFATE (PRESSORS) 50 MG/ML IJ SOLN
INTRAMUSCULAR | Status: DC | PRN
  Administered 2022-11-01: 10 mg via INTRAVENOUS

## 2022-11-01 MED ORDER — GLIMEPIRIDE 1 MG PO TABS
ORAL_TABLET | ORAL | Status: AC
  Filled 2022-11-01: qty 2

## 2022-11-01 MED ORDER — PANTOPRAZOLE SODIUM 40 MG PO TBEC
40.0000 mg | DELAYED_RELEASE_TABLET | Freq: Two times a day (BID) | ORAL | Status: DC
  Administered 2022-11-01 – 2022-11-02 (×3): 40 mg via ORAL

## 2022-11-01 MED ORDER — TRANEXAMIC ACID-NACL 1000-0.7 MG/100ML-% IV SOLN
INTRAVENOUS | Status: AC
  Filled 2022-11-01: qty 100

## 2022-11-01 MED ORDER — ONDANSETRON HCL 4 MG/2ML IJ SOLN
INTRAMUSCULAR | Status: DC | PRN
  Administered 2022-11-01: 4 mg via INTRAVENOUS

## 2022-11-01 MED ORDER — AMLODIPINE BESYLATE 5 MG PO TABS
5.0000 mg | ORAL_TABLET | Freq: Every day | ORAL | Status: DC
  Administered 2022-11-01: 5 mg via ORAL

## 2022-11-01 MED ORDER — BUPIVACAINE HCL (PF) 0.5 % IJ SOLN
INTRAMUSCULAR | Status: DC | PRN
  Administered 2022-11-01: 2.8 mL

## 2022-11-01 MED ORDER — PHENYLEPHRINE HCL-NACL 20-0.9 MG/250ML-% IV SOLN
INTRAVENOUS | Status: DC | PRN
  Administered 2022-11-01: 25 ug/min via INTRAVENOUS

## 2022-11-01 MED ORDER — 0.9 % SODIUM CHLORIDE (POUR BTL) OPTIME
TOPICAL | Status: DC | PRN
  Administered 2022-11-01: 500 mL

## 2022-11-01 MED ORDER — CEFAZOLIN SODIUM-DEXTROSE 2-4 GM/100ML-% IV SOLN
INTRAVENOUS | Status: AC
  Filled 2022-11-01: qty 100

## 2022-11-01 MED ORDER — PROPOFOL 10 MG/ML IV BOLUS
INTRAVENOUS | Status: AC
  Filled 2022-11-01: qty 20

## 2022-11-01 MED ORDER — PROPOFOL 1000 MG/100ML IV EMUL
INTRAVENOUS | Status: AC
  Filled 2022-11-01: qty 100

## 2022-11-01 MED ORDER — FENTANYL CITRATE (PF) 100 MCG/2ML IJ SOLN
INTRAMUSCULAR | Status: AC
  Filled 2022-11-01: qty 2

## 2022-11-01 MED ORDER — FENTANYL CITRATE (PF) 100 MCG/2ML IJ SOLN: 25.0000 ug | INTRAMUSCULAR | Status: DC | PRN

## 2022-11-01 MED ORDER — OXYCODONE HCL 5 MG/5ML PO SOLN: 5.0000 mg | Freq: Once | ORAL | Status: AC | PRN

## 2022-11-01 MED ORDER — SURGIRINSE WOUND IRRIGATION SYSTEM - OPTIME
TOPICAL | Status: DC | PRN
  Administered 2022-11-01: 450 mL via TOPICAL

## 2022-11-01 MED ORDER — ONDANSETRON HCL 4 MG/2ML IJ SOLN: 4.0000 mg | Freq: Four times a day (QID) | INTRAMUSCULAR | Status: DC | PRN

## 2022-11-01 MED ORDER — TRANEXAMIC ACID-NACL 1000-0.7 MG/100ML-% IV SOLN
1000.0000 mg | Freq: Once | INTRAVENOUS | Status: AC
  Administered 2022-11-01: 1000 mg via INTRAVENOUS

## 2022-11-01 MED ORDER — METFORMIN HCL 500 MG PO TABS
ORAL_TABLET | ORAL | Status: AC
  Filled 2022-11-01: qty 2

## 2022-11-01 MED ORDER — OXYBUTYNIN CHLORIDE 5 MG PO TABS
5.0000 mg | ORAL_TABLET | Freq: Three times a day (TID) | ORAL | Status: DC
  Administered 2022-11-01 – 2022-11-02 (×3): 5 mg via ORAL

## 2022-11-01 MED ORDER — INSULIN ASPART 100 UNIT/ML IJ SOLN
INTRAMUSCULAR | Status: AC
  Filled 2022-11-01: qty 1

## 2022-11-01 MED ORDER — OXYCODONE HCL 5 MG PO TABS
5.0000 mg | ORAL_TABLET | ORAL | Status: DC | PRN
  Administered 2022-11-01 – 2022-11-02 (×4): 5 mg via ORAL

## 2022-11-01 MED ORDER — FERROUS SULFATE 325 (65 FE) MG PO TABS
ORAL_TABLET | ORAL | Status: AC
  Filled 2022-11-01: qty 1

## 2022-11-01 MED ORDER — MAGNESIUM HYDROXIDE 400 MG/5ML PO SUSP
30.0000 mL | Freq: Every day | ORAL | Status: DC
  Administered 2022-11-01 – 2022-11-02 (×2): 30 mL via ORAL

## 2022-11-01 MED ORDER — INSULIN GLARGINE-YFGN 100 UNIT/ML ~~LOC~~ SOLN
20.0000 [IU] | Freq: Every day | SUBCUTANEOUS | Status: DC
  Administered 2022-11-01: 20 [IU] via SUBCUTANEOUS
  Filled 2022-11-01: qty 0.2

## 2022-11-01 MED ORDER — EPHEDRINE 5 MG/ML INJ
INTRAVENOUS | Status: AC
  Filled 2022-11-01: qty 5

## 2022-11-01 MED ORDER — INSULIN ASPART 100 UNIT/ML IJ SOLN
0.0000 [IU] | Freq: Three times a day (TID) | INTRAMUSCULAR | Status: DC
  Administered 2022-11-01: 8 [IU] via SUBCUTANEOUS
  Administered 2022-11-02: 2 [IU] via SUBCUTANEOUS

## 2022-11-01 MED ORDER — HYDROMORPHONE HCL 1 MG/ML IJ SOLN: 0.5000 mg | INTRAMUSCULAR | Status: DC | PRN

## 2022-11-01 MED ORDER — METOCLOPRAMIDE HCL 10 MG PO TABS
ORAL_TABLET | ORAL | Status: AC
  Filled 2022-11-01: qty 1

## 2022-11-01 MED ORDER — DIPHENHYDRAMINE HCL 12.5 MG/5ML PO ELIX: 12.5000 mg | ORAL_SOLUTION | ORAL | Status: DC | PRN

## 2022-11-01 MED ORDER — INSULIN ASPART 100 UNIT/ML IJ SOLN
8.0000 [IU] | Freq: Once | INTRAMUSCULAR | Status: AC
  Administered 2022-11-01: 8 [IU] via SUBCUTANEOUS

## 2022-11-01 MED ORDER — ACETAMINOPHEN 10 MG/ML IV SOLN
INTRAVENOUS | Status: DC | PRN
  Administered 2022-11-01: 1000 mg via INTRAVENOUS

## 2022-11-01 MED ORDER — ACETAMINOPHEN 10 MG/ML IV SOLN
1000.0000 mg | Freq: Four times a day (QID) | INTRAVENOUS | Status: DC
  Administered 2022-11-01 – 2022-11-02 (×3): 1000 mg via INTRAVENOUS

## 2022-11-01 MED ORDER — LISINOPRIL 20 MG PO TABS
ORAL_TABLET | ORAL | Status: AC
  Filled 2022-11-01: qty 2

## 2022-11-01 MED ORDER — OXYCODONE HCL 5 MG PO TABS
5.0000 mg | ORAL_TABLET | Freq: Once | ORAL | Status: AC | PRN
  Administered 2022-11-01: 5 mg via ORAL

## 2022-11-01 MED ORDER — MIDAZOLAM HCL 2 MG/2ML IJ SOLN
INTRAMUSCULAR | Status: AC
  Filled 2022-11-01: qty 2

## 2022-11-01 MED ORDER — AMLODIPINE BESYLATE 5 MG PO TABS
ORAL_TABLET | ORAL | Status: AC
  Filled 2022-11-01: qty 1

## 2022-11-01 MED ORDER — CELECOXIB 200 MG PO CAPS
400.0000 mg | ORAL_CAPSULE | Freq: Once | ORAL | Status: AC
  Administered 2022-11-01: 400 mg via ORAL

## 2022-11-01 MED ORDER — MENTHOL 3 MG MT LOZG: 1.0000 | LOZENGE | OROMUCOSAL | Status: DC | PRN

## 2022-11-01 MED ORDER — CARVEDILOL 12.5 MG PO TABS
12.5000 mg | ORAL_TABLET | Freq: Two times a day (BID) | ORAL | Status: DC
  Administered 2022-11-01 – 2022-11-02 (×2): 12.5 mg via ORAL

## 2022-11-01 MED ORDER — LISINOPRIL 20 MG PO TABS
40.0000 mg | ORAL_TABLET | Freq: Every day | ORAL | Status: DC
  Administered 2022-11-01: 40 mg via ORAL

## 2022-11-01 MED ORDER — TRAMADOL HCL 50 MG PO TABS: 50.0000 mg | ORAL_TABLET | ORAL | Status: DC | PRN

## 2022-11-01 MED ORDER — PROPOFOL 10 MG/ML IV BOLUS
INTRAVENOUS | Status: DC | PRN
  Administered 2022-11-01: 30 mg via INTRAVENOUS

## 2022-11-01 MED ORDER — FERROUS SULFATE 325 (65 FE) MG PO TABS
325.0000 mg | ORAL_TABLET | Freq: Two times a day (BID) | ORAL | Status: DC
  Administered 2022-11-01 – 2022-11-02 (×2): 325 mg via ORAL

## 2022-11-01 MED ORDER — PHENOL 1.4 % MT LIQD: 1.0000 | OROMUCOSAL | Status: DC | PRN

## 2022-11-01 MED ORDER — ASPIRIN 81 MG PO CHEW
CHEWABLE_TABLET | ORAL | Status: AC
  Filled 2022-11-01: qty 1

## 2022-11-01 MED ORDER — CHLORHEXIDINE GLUCONATE 0.12 % MT SOLN
15.0000 mL | Freq: Once | OROMUCOSAL | Status: AC
  Administered 2022-11-01: 15 mL via OROMUCOSAL

## 2022-11-01 MED ORDER — PHENYLEPHRINE HCL-NACL 20-0.9 MG/250ML-% IV SOLN
INTRAVENOUS | Status: AC
  Filled 2022-11-01: qty 250

## 2022-11-01 MED ORDER — PHENYLEPHRINE 80 MCG/ML (10ML) SYRINGE FOR IV PUSH (FOR BLOOD PRESSURE SUPPORT)
PREFILLED_SYRINGE | INTRAVENOUS | Status: AC
  Filled 2022-11-01: qty 10

## 2022-11-01 MED ORDER — ONDANSETRON HCL 4 MG/2ML IJ SOLN
INTRAMUSCULAR | Status: AC
  Filled 2022-11-01: qty 2

## 2022-11-01 MED ORDER — GABAPENTIN 300 MG PO CAPS
ORAL_CAPSULE | ORAL | Status: AC
  Filled 2022-11-01: qty 1

## 2022-11-01 MED ORDER — ACETAMINOPHEN 325 MG PO TABS: 325.0000 mg | ORAL_TABLET | Freq: Four times a day (QID) | ORAL | Status: DC | PRN

## 2022-11-01 MED ORDER — INSULIN GLARGINE-LIXISENATIDE 100-33 UNT-MCG/ML ~~LOC~~ SOPN: 19.0000 [IU] | PEN_INJECTOR | Freq: Every morning | SUBCUTANEOUS | Status: DC

## 2022-11-01 MED ORDER — PROPOFOL 500 MG/50ML IV EMUL
INTRAVENOUS | Status: DC | PRN
  Administered 2022-11-01: 100 ug/kg/min via INTRAVENOUS

## 2022-11-01 MED ORDER — CEFAZOLIN SODIUM-DEXTROSE 2-4 GM/100ML-% IV SOLN
2.0000 g | Freq: Four times a day (QID) | INTRAVENOUS | Status: AC
  Administered 2022-11-01 (×2): 2 g via INTRAVENOUS

## 2022-11-01 MED ORDER — ASPIRIN 81 MG PO CHEW
81.0000 mg | CHEWABLE_TABLET | Freq: Two times a day (BID) | ORAL | Status: DC
  Administered 2022-11-01 – 2022-11-02 (×2): 81 mg via ORAL

## 2022-11-01 MED ORDER — ATORVASTATIN CALCIUM 20 MG PO TABS
80.0000 mg | ORAL_TABLET | Freq: Every evening | ORAL | Status: DC
  Administered 2022-11-01: 80 mg via ORAL
  Filled 2022-11-01: qty 4

## 2022-11-01 MED ORDER — CELECOXIB 200 MG PO CAPS
ORAL_CAPSULE | ORAL | Status: AC
  Filled 2022-11-01: qty 2

## 2022-11-01 MED ORDER — METFORMIN HCL 500 MG PO TABS
1000.0000 mg | ORAL_TABLET | Freq: Two times a day (BID) | ORAL | Status: DC
  Administered 2022-11-01 – 2022-11-02 (×2): 1000 mg via ORAL

## 2022-11-01 MED ORDER — PANTOPRAZOLE SODIUM 40 MG PO TBEC
DELAYED_RELEASE_TABLET | ORAL | Status: AC
  Filled 2022-11-01: qty 1

## 2022-11-01 MED ORDER — FAMOTIDINE 20 MG PO TABS
ORAL_TABLET | ORAL | Status: AC
  Filled 2022-11-01: qty 1

## 2022-11-01 MED ORDER — MAGNESIUM HYDROXIDE 400 MG/5ML PO SUSP
ORAL | Status: AC
  Filled 2022-11-01: qty 30

## 2022-11-01 MED ORDER — FAMOTIDINE 20 MG PO TABS
20.0000 mg | ORAL_TABLET | Freq: Once | ORAL | Status: AC
  Administered 2022-11-01: 20 mg via ORAL

## 2022-11-01 MED ORDER — BUPIVACAINE HCL (PF) 0.5 % IJ SOLN
INTRAMUSCULAR | Status: AC
  Filled 2022-11-01: qty 10

## 2022-11-01 MED ORDER — METOCLOPRAMIDE HCL 10 MG PO TABS
10.0000 mg | ORAL_TABLET | Freq: Three times a day (TID) | ORAL | Status: DC
  Administered 2022-11-01 – 2022-11-02 (×3): 10 mg via ORAL

## 2022-11-01 MED ORDER — DEXAMETHASONE SODIUM PHOSPHATE 10 MG/ML IJ SOLN
INTRAMUSCULAR | Status: AC
  Filled 2022-11-01: qty 1

## 2022-11-01 MED ORDER — PHENYLEPHRINE HCL (PRESSORS) 10 MG/ML IV SOLN
INTRAVENOUS | Status: DC | PRN
  Administered 2022-11-01: 80 ug via INTRAVENOUS
  Administered 2022-11-01: 40 ug via INTRAVENOUS

## 2022-11-01 MED ORDER — CELECOXIB 200 MG PO CAPS
200.0000 mg | ORAL_CAPSULE | Freq: Two times a day (BID) | ORAL | Status: DC
  Administered 2022-11-02: 200 mg via ORAL

## 2022-11-01 MED ORDER — DEXAMETHASONE SODIUM PHOSPHATE 10 MG/ML IJ SOLN
8.0000 mg | Freq: Once | INTRAMUSCULAR | Status: AC
  Administered 2022-11-01: 8 mg via INTRAVENOUS

## 2022-11-01 MED ORDER — INSULIN ASPART 100 UNIT/ML IJ SOLN
0.0000 [IU] | Freq: Every day | INTRAMUSCULAR | Status: DC
  Administered 2022-11-01: 3 [IU] via SUBCUTANEOUS

## 2022-11-01 MED ORDER — BISACODYL 10 MG RE SUPP: 10.0000 mg | Freq: Every day | RECTAL | Status: DC | PRN

## 2022-11-01 MED ORDER — ACETAMINOPHEN 500 MG PO TABS
ORAL_TABLET | ORAL | Status: AC
  Filled 2022-11-01: qty 2

## 2022-11-01 MED ORDER — OXYCODONE HCL 5 MG PO TABS: 10.0000 mg | ORAL_TABLET | ORAL | Status: DC | PRN

## 2022-11-01 MED ORDER — GLIMEPIRIDE 1 MG PO TABS
2.0000 mg | ORAL_TABLET | Freq: Every day | ORAL | Status: DC
  Administered 2022-11-01: 2 mg via ORAL

## 2022-11-01 MED ORDER — CHLORHEXIDINE GLUCONATE 4 % EX SOLN
60.0000 mL | Freq: Once | CUTANEOUS | Status: AC
  Administered 2022-11-01: 4 via TOPICAL

## 2022-11-01 MED ORDER — CARVEDILOL 12.5 MG PO TABS
ORAL_TABLET | ORAL | Status: AC
  Filled 2022-11-01: qty 1

## 2022-11-01 MED ORDER — SENNOSIDES-DOCUSATE SODIUM 8.6-50 MG PO TABS
1.0000 | ORAL_TABLET | Freq: Two times a day (BID) | ORAL | Status: DC
  Administered 2022-11-01 – 2022-11-02 (×3): 1 via ORAL

## 2022-11-01 MED ORDER — SODIUM CHLORIDE 0.9 % IR SOLN
Status: DC | PRN
  Administered 2022-11-01: 3000 mL

## 2022-11-01 MED ORDER — MIDAZOLAM HCL 5 MG/5ML IJ SOLN
INTRAMUSCULAR | Status: DC | PRN
  Administered 2022-11-01: 2 mg via INTRAVENOUS

## 2022-11-01 MED ORDER — CHLORHEXIDINE GLUCONATE 0.12 % MT SOLN
OROMUCOSAL | Status: AC
  Filled 2022-11-01: qty 15

## 2022-11-01 MED ORDER — GABAPENTIN 300 MG PO CAPS
300.0000 mg | ORAL_CAPSULE | Freq: Once | ORAL | Status: AC
  Administered 2022-11-01: 300 mg via ORAL

## 2022-11-01 MED ORDER — ORAL CARE MOUTH RINSE: 15.0000 mL | Freq: Once | OROMUCOSAL | Status: AC

## 2022-11-01 MED ORDER — ALUM & MAG HYDROXIDE-SIMETH 200-200-20 MG/5ML PO SUSP: 30.0000 mL | ORAL | Status: DC | PRN

## 2022-11-01 MED ORDER — TRANEXAMIC ACID-NACL 1000-0.7 MG/100ML-% IV SOLN
1000.0000 mg | INTRAVENOUS | Status: AC
  Administered 2022-11-01: 1000 mg via INTRAVENOUS

## 2022-11-01 MED ORDER — CEFAZOLIN SODIUM-DEXTROSE 2-4 GM/100ML-% IV SOLN
2.0000 g | INTRAVENOUS | Status: AC
  Administered 2022-11-01: 2 g via INTRAVENOUS

## 2022-11-01 SURGICAL SUPPLY — 54 items
BLADE SAW 90X25X1.19 OSCILLAT (BLADE) ×1 IMPLANT
BRUSH SCRUB EZ PLAIN DRY (MISCELLANEOUS) ×1 IMPLANT
DRAPE 3/4 80X56 (DRAPES) ×1 IMPLANT
DRAPE INCISE IOBAN 66X60 STRL (DRAPES) ×1 IMPLANT
DRSG AQUACEL AG ADV 3.5X14 (GAUZE/BANDAGES/DRESSINGS) ×1 IMPLANT
DRSG DERMACEA NONADH 3X8 (GAUZE/BANDAGES/DRESSINGS) IMPLANT
DRSG MEPILEX SACRM 8.7X9.8 (GAUZE/BANDAGES/DRESSINGS) ×1 IMPLANT
DRSG NON-ADHERENT DERMACEA 3X4 (GAUZE/BANDAGES/DRESSINGS) ×1 IMPLANT
DRSG TEGADERM 4X4.75 (GAUZE/BANDAGES/DRESSINGS) ×1 IMPLANT
DURAPREP 26ML APPLICATOR (WOUND CARE) ×2 IMPLANT
ELECT CAUTERY BLADE 6.4 (BLADE) ×1 IMPLANT
ELECT REM PT RETURN 9FT ADLT (ELECTROSURGICAL) ×1
ELECTRODE REM PT RTRN 9FT ADLT (ELECTROSURGICAL) ×1 IMPLANT
GLOVE BIOGEL M STRL SZ7.5 (GLOVE) ×4 IMPLANT
GLOVE SRG 8 PF TXTR STRL LF DI (GLOVE) ×2 IMPLANT
GLOVE SURG UNDER POLY LF SZ8 (GLOVE) ×2
GOWN STRL REUS W/ TWL LRG LVL3 (GOWN DISPOSABLE) ×2 IMPLANT
GOWN STRL REUS W/ TWL XL LVL3 (GOWN DISPOSABLE) ×1 IMPLANT
GOWN STRL REUS W/TWL LRG LVL3 (GOWN DISPOSABLE) ×2
GOWN STRL REUS W/TWL XL LVL3 (GOWN DISPOSABLE) ×1
GOWN TOGA ZIPPER T7+ PEEL AWAY (MISCELLANEOUS) ×1 IMPLANT
HANDLE YANKAUER SUCT OPEN TIP (MISCELLANEOUS) ×1 IMPLANT
HEAD FEM STD 32X+1 STRL (Hips) IMPLANT
HEMOVAC 400CC 10FR (MISCELLANEOUS) ×1 IMPLANT
HOLDER FOLEY CATH W/STRAP (MISCELLANEOUS) ×1 IMPLANT
HOOD PEEL AWAY T7 (MISCELLANEOUS) ×1 IMPLANT
IV NS IRRIG 3000ML ARTHROMATIC (IV SOLUTION) ×1 IMPLANT
KIT PEG BOARD PINK (KITS) ×1 IMPLANT
KIT TURNOVER KIT A (KITS) ×1 IMPLANT
LINER PINN ALTRX ACE P4 HIP (Liner) IMPLANT
MANIFOLD NEPTUNE II (INSTRUMENTS) ×2 IMPLANT
NS IRRIG 500ML POUR BTL (IV SOLUTION) ×1 IMPLANT
PACK HIP PROSTHESIS (MISCELLANEOUS) ×1 IMPLANT
PIN SECT CUP 50MM (Hips) IMPLANT
PIN STEIN THRED 5/32 (Pin) IMPLANT
PULSAVAC PLUS IRRIG FAN TIP (DISPOSABLE) ×1
SOL PREP PVP 2OZ (MISCELLANEOUS) ×1
SOLUTION IRRIG SURGIPHOR (IV SOLUTION) ×1 IMPLANT
SOLUTION PREP PVP 2OZ (MISCELLANEOUS) ×1 IMPLANT
SPONGE DRAIN TRACH 4X4 STRL 2S (GAUZE/BANDAGES/DRESSINGS) ×1 IMPLANT
STAPLER SKIN PROX 35W (STAPLE) ×1 IMPLANT
STEM FEMORAL SZ 5MM STD ACTIS (Stem) IMPLANT
SUT ETHIBOND #5 BRAIDED 30INL (SUTURE) ×1 IMPLANT
SUT VIC AB 0 CT1 36 (SUTURE) ×2 IMPLANT
SUT VIC AB 1 CT1 36 (SUTURE) ×2 IMPLANT
SUT VIC AB 2-0 CT1 27 (SUTURE) ×1
SUT VIC AB 2-0 CT1 TAPERPNT 27 (SUTURE) ×1 IMPLANT
TAPE CLOTH 3X10 WHT NS LF (GAUZE/BANDAGES/DRESSINGS) ×1 IMPLANT
TIP FAN IRRIG PULSAVAC PLUS (DISPOSABLE) ×1 IMPLANT
TOWEL OR 17X26 4PK STRL BLUE (TOWEL DISPOSABLE) IMPLANT
TRAP FLUID SMOKE EVACUATOR (MISCELLANEOUS) ×1 IMPLANT
TRAY FOLEY MTR SLVR 16FR STAT (SET/KITS/TRAYS/PACK) ×1 IMPLANT
TUBING CONNECTING 10 (TUBING) ×2 IMPLANT
WATER STERILE IRR 1000ML POUR (IV SOLUTION) ×1 IMPLANT

## 2022-11-01 NOTE — Inpatient Diabetes Management (Signed)
Inpatient Diabetes Program Recommendations  AACE/ADA: New Consensus Statement on Inpatient Glycemic Control (2015)  Target Ranges:  Prepandial:   less than 140 mg/dL      Peak postprandial:   less than 180 mg/dL (1-2 hours)      Critically ill patients:  140 - 180 mg/dL   Lab Results  Component Value Date   GLUCAP 281 (H) 11/01/2022   HGBA1C 6.8 (H) 10/22/2022    Review of Glycemic Control  Latest Reference Range & Units 11/01/22 06:24 11/01/22 10:40  Glucose-Capillary 70 - 99 mg/dL 098 (H) 119 (H)   Diabetes history: DM 2 Outpatient Diabetes medications:  Amaryl 2 mg daily Soliqua (Glargine-Lixisenatide)- 19 units q AM Metformin 1000 mg bid Current orders for Inpatient glycemic control:  Soliqua (Glargine-Lixisenatide)- 19 units q AM Novolog 0-15 units tid with meals and HS Amaryl 2 mg daily with supper Metformin 1000 mg bid  Inpatient Diabetes Program Recommendations:    Note that Soliqua (Glargine-Lixisenatide)- is not on formulary in hospital.  Consider d/c of Glargine-Lixisenatide and add Semglee 20 units q HS (starting tonight).  Of note patient did receive Decadron 8 mg x1 intra-op which will likely increase blood sugars for the next 24 hours.  Will follow.   Thanks,  Beryl Meager, RN, BC-ADM Inpatient Diabetes Coordinator Pager [phone]  (8a-5p)

## 2022-11-01 NOTE — Op Note (Signed)
OPERATIVE NOTE  DATE OF SURGERY:  11/01/2022  PATIENT NAME:  Morgan Nicholson   DOB: 03/11/1946  MRN: [id number]  PRE-OPERATIVE DIAGNOSIS: Degenerative arthrosis of the right hip, primary  POST-OPERATIVE DIAGNOSIS:  Same  PROCEDURE:  Right total hip arthroplasty  SURGEON:  Jena Gauss. M.D.  ASSISTANT:  Gean Birchwood, PA-C (present and scrubbed throughout the case, critical for assistance with exposure, retraction, instrumentation, and closure)  ANESTHESIA: spinal  ESTIMATED BLOOD LOSS: 100 mL  FLUIDS REPLACED: 800 mL of crystalloid  DRAINS: 2 medium Hemovac drains  IMPLANTS UTILIZED: DePuy size 5 standard offset Actis femoral stem, 50 mm OD Pinnacle 100 acetabular component, +4 mm 10 degree Pinnacle Altrx polyethylene insert, and a 32 mm CoCr +1 mm hip ball  INDICATIONS FOR SURGERY: LILY VELASQUEZ is a 77 y.o. year old female with a long history of progressive hip and groin  pain. X-rays demonstrated severe degenerative changes. The patient had not seen any significant improvement despite conservative nonsurgical intervention. After discussion of the risks and benefits of surgical intervention, the patient expressed understanding of the risks benefits and agree with plans for total hip arthroplasty.   The risks, benefits, and alternatives were discussed at length including but not limited to the risks of infection, bleeding, nerve injury, stiffness, blood clots, the need for revision surgery, limb length inequality, dislocation, cardiopulmonary complications, among others, and they were willing to proceed.  PROCEDURE IN DETAIL: The patient was brought into the operating room and, after adequate spinal anesthesia was achieved, the patient was placed in a left lateral decubitus position. Axillary roll was placed and all bony prominences were well-padded. The patient's right hip was cleaned and prepped with alcohol and DuraPrep and draped in the usual sterile fashion. A "timeout" was  performed as per usual protocol. A lateral curvilinear incision was made gently curving towards the posterior superior iliac spine. The IT band was incised in line with the skin incision and the fibers of the gluteus maximus were split in line. The piriformis tendon was identified, skeletonized, and incised at its insertion to the proximal femur and reflected posteriorly. A T type posterior capsulotomy was performed. Prior to dislocation of the femoral head, a threaded Steinmann pin was inserted through a separate stab incision into the pelvis superior to the acetabulum and bent in the form of a stylus so as to assess limb length and hip offset throughout the procedure. The femoral head was then dislocated posteriorly. Inspection of the femoral head demonstrated severe degenerative changes with full-thickness loss of articular cartilage. The femoral neck cut was performed using an oscillating saw. The anterior capsule was elevated off of the femoral neck using a periosteal elevator. Attention was then directed to the acetabulum. The remnant of the labrum was excised using electrocautery. Inspection of the acetabulum also demonstrated significant degenerative changes. The acetabulum was reamed in sequential fashion up to a 49 mm diameter. Good punctate bleeding bone was encountered. A 50 mm Pinnacle 100 acetabular component was positioned and impacted into place. Good scratch fit was appreciated. A +4 mm neutral polyethylene trial was inserted.  Attention was then directed to the proximal femur.  Femoral broaches were inserted in a sequential fashion up to a size 5 broach. Calcar region was planed and a trial reduction was performed using a standard offset neck and a 32 mm hip ball with a +1 mm neck length.  Reasonably good stability was noted but it was elected to trial with a +4 mm 10  degree trial with the high side at the 8 o'clock position.  Good equalization of limb lengths and hip offset was appreciated and  excellent stability was noted both anteriorly and posteriorly. Trial components were removed. The acetabular shell was irrigated with copious amounts of normal saline with antibiotic solution and suctioned dry. A +4 mm 10 degree Pinnacle Altrx polyethylene insert was positioned with the high side at the 8 o'clock position and impacted into place. Next, a size 5 standard offset Actis femoral stem was positioned and impacted into place. Excellent scratch fit was appreciated. A trial reduction was again performed with a 32 mm hip ball with a +1 mm neck length. Again, good equalization of limb lengths was appreciated and excellent stability appreciated both anteriorly and posteriorly. The hip was then dislocated and the trial hip ball was removed. The Morse taper was cleaned and dried. A 32 mm cobalt chrome hip ball with a +1 mm neck length was placed on the trunnion and impacted into place. The hip was then reduced and placed through range of motion. Excellent stability was appreciated both anteriorly and posteriorly.  The wound was irrigated with copious amounts of normal saline followed by 450 ml of Surgiphor and suctioned dry. Good hemostasis was appreciated. The posterior capsulotomy was repaired using #5 Ethibond. Piriformis tendon was reapproximated to the undersurface of the gluteus medius tendon using #5 Ethibond. The IT band was reapproximated using interrupted sutures of #1 Vicryl. Subcutaneous tissue was approximated using first #0 Vicryl followed by #2-0 Vicryl. The skin was closed with skin staples.  The patient tolerated the procedure well and was transported to the recovery room in stable condition.   Jena Gauss., M.D.

## 2022-11-01 NOTE — Anesthesia Procedure Notes (Signed)
Spinal  Patient location during procedure: OR Start time: 11/01/2022 7:23 AM End time: 11/01/2022 7:31 AM Reason for block: surgical anesthesia Staffing Performed: resident/CRNA  Anesthesiologist: Stephanie Coup, MD Resident/CRNA: Sonnie Bias, [place], CRNA Performed by: Naquita Nappier, [place], CRNA Authorized by: Stephanie Coup, MD   Preanesthetic Checklist Completed: patient identified, IV checked, site marked, risks and benefits discussed, surgical consent, monitors and equipment checked, pre-op evaluation and timeout performed Spinal Block Patient position: sitting Prep: Betadine Patient monitoring: heart rate, continuous pulse ox, blood pressure and cardiac monitor Approach: midline Location: L4-5 Injection technique: single-shot Needle Needle type: Whitacre and Introducer  Needle gauge: 24 G Needle length: 9 cm Assessment Events: CSF return Additional Notes Negative paresthesia. Negative blood return. Positive free-flowing CSF. Expiration date of kit checked and confirmed. Patient tolerated procedure well, without complications.

## 2022-11-01 NOTE — Transfer of Care (Signed)
Immediate Anesthesia Transfer of Care Note  Patient: Morgan Nicholson  Procedure(s) Performed: TOTAL HIP ARTHROPLASTY (Right: Hip)  Patient Location: PACU  Anesthesia Type:General  Level of Consciousness: drowsy  Airway & Oxygen Therapy: Patient Spontanous Breathing  Post-op Assessment: Report given to RN and Post -op Vital signs reviewed and stable  Post vital signs: Reviewed and stable  Last Vitals:  Vitals Value Taken Time  BP 106/58 11/01/22 1040  Temp 36.2 C 11/01/22 1040  Pulse 71 11/01/22 1043  Resp 23 11/01/22 1043  SpO2 98 % 11/01/22 1043  Vitals shown include unfiled device data.  Last Pain:  Vitals:   11/01/22 0623  TempSrc: Temporal  PainSc: 0-No pain         Complications: No notable events documented.

## 2022-11-01 NOTE — Evaluation (Addendum)
Physical Therapy Evaluation Patient Details Name: Morgan Nicholson MRN: [id number] DOB: Mar 07, 1946 Today's Date: 11/01/2022  History of Present Illness  Pt is a 77yo female presenting s/p R THA. PMH pertinent for stroke (2022), hip OA, HTN, CHF, DM, and renal disease.  Clinical Impression   Pt presents laying in bed with daughters in the room, currently 4/10 pain in R hip. She currently lives alone in a 3rd floor apartment with an Engineer, structural. PTA she was independent with all mobiltiy/ADLs.   Gross strength and sensation screening was performed and unremarkable. Pt/PT reviewed posterior hip precautions and pt able to perform some supine exercises. Pt was able to perform supine> sit modI, requiring increased time, and sit<> stand with CGA and RW for safety. Pt able to ambulate ~75ft with MinA and RW, observing decreased gait velocity and stride length. Pt noted some "wooziness" throughout treatment that remained unchanged. Pt would benefit from continued PT to maximize functional independence.       Assistance Recommended at Discharge Intermittent Supervision/Assistance  If plan is discharge home, recommend the following:  Can travel by private vehicle  A little help with walking and/or transfers;A little help with bathing/dressing/bathroom;Assist for transportation;Direct supervision/assist for medications management;Help with stairs or ramp for entrance;Assistance with cooking/housework        Equipment Recommendations None recommended by PT  Recommendations for Other Services       Functional Status Assessment Patient has had a recent decline in their functional status and demonstrates the ability to make significant improvements in function in a reasonable and predictable amount of time.     Precautions / Restrictions Precautions Precautions: Posterior Hip;Fall Precaution Booklet Issued: Yes (comment) Precaution Comments: Precautions reviewed at the beginning and end of PT  session Restrictions Weight Bearing Restrictions: Yes RLE Weight Bearing: Weight bearing as tolerated      Mobility  Bed Mobility Overal bed mobility: Modified Independent             General bed mobility comments: Increased time    Transfers Overall transfer level: Needs assistance Equipment used: Rolling walker (2 wheels) Transfers: Sit to/from Stand Sit to Stand: Min guard           General transfer comment: Some wooziness upon standing    Ambulation/Gait Ambulation/Gait assistance: Min Chemical engineer (Feet): 80 Feet Assistive device: Rolling walker (2 wheels) Gait Pattern/deviations: Decreased stance time - right, Decreased stride length Gait velocity: decreased     General Gait Details: some wooziness, improved with walking  Stairs            Wheelchair Mobility     Tilt Bed    Modified Rankin (Stroke Patients Only)       Balance Overall balance assessment: Needs assistance Sitting-balance support: Feet supported, No upper extremity supported Sitting balance-Leahy Scale: Good     Standing balance support: Reliant on assistive device for balance, During functional activity, Bilateral upper extremity supported Standing balance-Leahy Scale: Fair                               Pertinent Vitals/Pain Pain Assessment Pain Assessment: 0-10 Pain Score: 4  Pain Location: R hip Pain Descriptors / Indicators: Discomfort, Sore, Constant Pain Intervention(s): Monitored during session    Home Living Family/patient expects to be discharged to:: Private residence Living Arrangements: Alone   Type of Home: Apartment Home Access: Elevator       Home Layout: One level Home Equipment:  Rolling Walker (2 wheels);Rollator (4 wheels);Cane - single point;BSC/3in1;Shower seat      Prior Function Prior Level of Function : Independent/Modified Independent                     Hand Dominance        Extremity/Trunk  Assessment   Upper Extremity Assessment Upper Extremity Assessment: Overall WFL for tasks assessed    Lower Extremity Assessment Lower Extremity Assessment: Generalized weakness;RLE deficits/detail;LLE deficits/detail RLE Deficits / Details: Pt verbalized weakness in RLE vs LLE RLE Sensation: WNL LLE Sensation: WNL       Communication   Communication: No difficulties  Cognition Arousal/Alertness: Awake/alert Behavior During Therapy: WFL for tasks assessed/performed Overall Cognitive Status: Within Functional Limits for tasks assessed                                          General Comments      Exercises Total Joint Exercises Ankle Circles/Pumps: AROM, Both, 20 reps, Supine Quad Sets: AROM, Right, 10 reps, Supine   Assessment/Plan    PT Assessment Patient needs continued PT services  PT Problem List Decreased strength;Decreased range of motion;Decreased activity tolerance;Decreased balance;Decreased mobility;Decreased knowledge of precautions;Decreased safety awareness;Decreased knowledge of use of DME;Pain       PT Treatment Interventions DME instruction;Therapeutic exercise;Gait training;Balance training;Stair training;Functional mobility training;Therapeutic activities;Patient/family education    PT Goals (Current goals can be found in the Care Plan section)  Acute Rehab PT Goals Patient Stated Goal: Return home PT Goal Formulation: With patient Time For Goal Achievement: 11/15/22 Potential to Achieve Goals: Good    Frequency BID     Co-evaluation               AM-PAC PT "6 Clicks" Mobility  Outcome Measure Help needed turning from your back to your side while in a flat bed without using bedrails?: None Help needed moving from lying on your back to sitting on the side of a flat bed without using bedrails?: A Little Help needed moving to and from a bed to a chair (including a wheelchair)?: A Little Help needed standing up from a chair  using your arms (e.g., wheelchair or bedside chair)?: A Little Help needed to walk in hospital room?: A Little Help needed climbing 3-5 steps with a railing? : A Little 6 Click Score: 19    End of Session Equipment Utilized During Treatment: Gait belt Activity Tolerance: Patient tolerated treatment well;No increased pain Patient left: in chair;with call bell/phone within reach;with family/visitor present Nurse Communication: Mobility status PT Visit Diagnosis: Muscle weakness (generalized) (M62.81);Difficulty in walking, not elsewhere classified (R26.2)    Time: 3664-4034 PT Time Calculation (min) (ACUTE ONLY): 37 min   Charges:   PT Evaluation $PT Eval Low Complexity: 1 Low PT Treatments $Therapeutic Exercise: 8-22 mins $Therapeutic Activity: 8-22 mins PT General Charges $$ ACUTE PT VISIT: 1 Visit        Tajuanna Burnett, PT, SPT 3:58 PM,11/01/22

## 2022-11-01 NOTE — TOC Progression Note (Addendum)
Transition of Care [facility]) - Progression Note    Patient Details  Name: Morgan Nicholson MRN: [id number] Date of Birth: 09/23/1945  Transition of Care [facility]) CM/SW Contact  Marlowe Sax, RN Phone Number: 11/01/2022, 3:14 PM  Clinical Narrative:     Patient is set up with [facility] for [facility] services prior to surgery by surgeons office   Patient attended the Joint in motion class and reports that she has a Rolling walker at home, she also has a grabber , a shoe tongue and a shower seat    Expected Discharge Plan and Services                                               Social Determinants of Health (SDOH) Interventions SDOH Screenings   Food Insecurity: No Food Insecurity (11/01/2022)  Housing: Low Risk  (11/01/2022)  Transportation Needs: No Transportation Needs (11/01/2022)  Utilities: Not At Risk (11/01/2022)  Tobacco Use: Low Risk  (11/01/2022)    Readmission Risk Interventions     No data to display

## 2022-11-01 NOTE — Interval H&P Note (Signed)
History and Physical Interval Note:  11/01/2022 6:50 AM  Morgan Nicholson  has presented today for surgery, with the diagnosis of PRIMARY OSTEOARTHRTIS OF RIGHT HIP.  The various methods of treatment have been discussed with the patient and family. After consideration of risks, benefits and other options for treatment, the patient has consented to  Procedure(s): TOTAL HIP ARTHROPLASTY (Right) as a surgical intervention.  The patient's history has been reviewed, patient examined, no change in status, stable for surgery.  I have reviewed the patient's chart and labs.  Questions were answered to the patient's satisfaction.     [name] P Lexxie Winberg

## 2022-11-01 NOTE — H&P (Signed)
ORTHOPAEDIC HISTORY & PHYSICAL Morgan Maudlin, PA - 10/26/2022 10:45 AM EDT Formatting of this note is different from the original. Images from the original note were not included. Chief Complaint Chief Complaint Patient presents with Hip Pain H & P RIGHT HIP  Reason for Visit Morgan Nicholson is a 77 y.o. who presents today for a history and physical. She is to undergo a right total hip arthroplasty on 11/01/2022. Since her last visit at the clinic there have been no change in her condition. Patient expresses her desire to proceed with surgery.  She reports a 4-year history of right hip and groin pain. The pain is worse with weight bearing and rotation of the hip . The hip pain limits the patient's ability to ambulate long distances. The patient has not appreciated any significant improvement despite Tylenol, NSAIDs, intra-articular corticosteroid injections, and activity modification. She is not using any ambulatory aids. The patient states that the hip pain has progressed to the point that it is significantly interfering with her activities of daily living.  Of note, she did have an ischemic stroke in October 2022 with some resistance residual gait  Past Medical History Past Medical History: Diagnosis Date Arthritis Hip, back Cataract cortical, senile 2012 Starting CKD (chronic kidney disease), stage II Diabetes mellitus type 2, uncomplicated (CMS/HHS-HCC) Encounter for blood transfusion 1965 Child birth History of blood transfusion History of colonic polyps hyperplastic History of stroke Hyperlipidemia Hypertension Osteopenia Type 2 diabetes mellitus with stage 2 chronic kidney disease (CMS/HHS-HCC) Vertigo  Past Surgical History Past Surgical History: Procedure Laterality Date TUBAL LIGATION 1976 COLONOSCOPY 08/29/2013 Adenomatous Polyps, FH Colon Polyps (Mother): [facility]BF 08/2016; Recall Ltr mailed 07/20/2016 (dw) COLONOSCOPY 02/23/2017 Adenomatous Polyp, FH Colon Polyps  (Mother): [facility] 02/2022 Procedure: 1. Endoscopic right carpal tunnel release. 2. Release right trigger thumb. Right 05/01/2020 Dr.Poggi BTL COLONOSCOPY 08/07/2008, 04/11/2003 FH Colon Polyps (Mother)  Past Family History Family History Problem Relation Age of Onset Colon polyps Mother High blood pressure (Hypertension) Mother Diabetes type II Mother Diabetes Daughter  Medications Current Outpatient Medications Medication Sig Dispense Refill acyclovir (ZOVIRAX) 800 MG tablet TAKE 1 TABLET BY MOUTH 3 TIMES DAILY AS NEEDED. 45 tablet 3 amLODIPine (NORVASC) 5 MG tablet TAKE 1 TABLET EVERY DAY 90 tablet 1 aspirin 81 MG EC tablet Take 81 mg by mouth once daily atorvastatin (LIPITOR) 80 MG tablet TAKE 1 TABLET EVERY DAY 90 tablet 3 blood glucose diagnostic (GLUCOSE BLOOD) test strip 1 each (1 strip total) 3 (three) times daily Use as instructed. (Patient taking differently: 1 strip 2 (two) times daily Use as instructed.) 300 each 3 blood glucose meter kit as directed 1 each 0 blood-glucose sensor (DEXCOM G7 SENSOR) Devi Use 1 Device every 10 (ten) days 9 each 3 calcium carbonate 300 mg (750 mg) chewable tablet Take 1 tablet by mouth once daily as needed for Heartburn calcium carbonate-vitamin D3 (OS-CAL 500+D) 500 mg(1,250mg ) -200 unit tablet Take 1 tablet by mouth 2 (two) times daily with meals. carvediloL (COREG) 12.5 MG tablet Take 1 tablet (12.5 mg total) by mouth 2 (two) times daily with meals 180 tablet 1 glimepiride (AMARYL) 2 MG tablet TAKE 1 TABLET EVERY DAY WITH BREAKFAST 90 tablet 3 insulin glargine-lixisenatide (SOLIQUA) 100 unit-33 mcg/mL pen injector Inject 19 units SQ each day. 15 mL 6 ketoconazole (NIZORAL) 2 % shampoo APPLY TO SCALP. LATHER AND LEAVE ON FOR 5 MINUTES BEFORE RINSING. USE EVERY OTHER DAY. lisinopriL (ZESTRIL) 40 MG tablet Take 1 tablet (40 mg total) by mouth once  daily 90 tablet 1 meloxicam (MOBIC) 7.5 MG tablet TAKE 1 TABLET BY MOUTH EVERY DAY 30 tablet  1 metFORMIN (GLUCOPHAGE) 1000 MG tablet Take 1 tablet (1,000 mg total) by mouth 2 (two) times daily with meals 180 tablet 1 mometasone (ELOCON) 0.1 % lotion Apply topically oxyBUTYnin (DITROPAN) 5 mg tablet TAKE 1 TABLET THREE TIMES DAILY 270 tablet 3 pen needle, diabetic 32 gauge x 5/32" Ndle Use 1 each once daily 100 each 3  No current facility-administered medications for this visit.  Allergies No Known Allergies  Review of Systems A comprehensive 14 point ROS was performed, reviewed, and the pertinent orthopaedic findings are documented in the HPI.  Exam BP 130/80 (BP Location: Left upper arm, Patient Position: Sitting, BP Cuff Size: Adult)  Ht 162.6 cm (5\' 4" )  Wt 72.3 kg (159 lb 6.4 oz)  BMI 27.36 kg/m  General: Well-developed well-nourished female seen in no acute distress.  HEENT: Atraumatic,normocephalic. Pupils are equal and reactive to light. Oropharynx is clear with moist mucosa  Lungs: Clear to auscultation bilaterally  Cardiovascular: Regular rate and rhythm. Normal S1, S2. No murmurs. No appreciable gallops or rubs. Peripheral pulses are palpable.  Abdomen: Soft, non-tender, nondistended. Bowel sounds present  Extremity: Right Hip: Pelvic tilt: Negative Limb lengths: Equal with the patient standing Soft tissue swelling:Negative Erythema: Negative Crepitance: Negative Tenderness: Greater trochanter is nontender to palpation. Severe pain is elicited by axial compression or extremes of rotation. Atrophy: No atrophy. Fair to good hip flexor and abductor strength. Range of Motion:EXT/FLEX: 0/0/100 ADD/ABD: 20/0/20 IR/ER: 0/0/30  Neurological:  The patient is alert and oriented Sensation to light touch appears to be intact and within normal limits Gross motor strength appeared to be equal to 5/5  Vascular :  Peripheral pulses felt to be palpable. Capillary refill appears to be intact and within normal limits  X-ray  1. X-rays taken in [facility] on 09/30/2022 showed significant narrowing of the cartilage space mostly centrally. Subchondral sclerosis as well as osteophyte formation with are noted. No acute bony abnormalities noted.  Impression  1. Degenerative arthrosis right hip  Plan  1. I have gone over the patient's medication on today's visit 2. Past medical history has been reviewed 3. Postop rehab has been discussed 4. Return to clinic 6 weeks postop  This note was generated in part with voice recognition software and I apologize for any typographical errors that were not detected and corrected   Tera Partridge PA Electronically signed by Morgan Maudlin, PA at 10/26/2022 1:09 PM EDT

## 2022-11-01 NOTE — Anesthesia Preprocedure Evaluation (Signed)
Anesthesia Evaluation  Patient identified by MRN, date of birth, ID band Patient awake    Reviewed: Allergy & Precautions, NPO status , Patient's Chart, lab work & pertinent test results  History of Anesthesia Complications Negative for: history of anesthetic complications  Airway Mallampati: III  TM Distance: >3 FB Neck ROM: full    Dental  (+) Chipped, Dental Advidsory Given   Pulmonary neg pulmonary ROS, neg shortness of breath   Pulmonary exam normal        Cardiovascular hypertension, (-) angina +CHF  Normal cardiovascular exam(-) dysrhythmias      Neuro/Psych CVA, Residual Symptoms  negative psych ROS   GI/Hepatic negative GI ROS, Neg liver ROS,,,  Endo/Other  negative endocrine ROSdiabetes    Renal/GU Renal disease     Musculoskeletal   Abdominal   Peds  Hematology negative hematology ROS (+)   Anesthesia Other Findings Past Medical History: 08/29/2013: Adenomatous polyp No date: Aortic atherosclerosis (HCC) No date: Arthritis of both hips 01/24/2021: Bilateral carotid artery disease (HCC)     Comment:  a.) CTA neck 01/24/2021: 55% RICA, 45% LICA No date: Bilateral cataracts No date: Cervical spinal stenosis No date: CKD (chronic kidney disease), stage III (HCC) 01/25/2021: Diastolic dysfunction     Comment:  a.) TTE 01/25/2021: EF 60-65%, mild LVH, mild LA dil,               G1DD No date: High cholesterol No date: Hyperplastic colon polyp No date: Hypertension 03/04/2021: Implantable loop recorder present     Comment:  a.) Linq II device 01/24/2021: Ischemic stroke [facility])     Comment:  a.) MRI brain 01/24/2021: acute ischemia at the base of               the LEFT central sulcus No date: OAB (overactive bladder) No date: OSA (obstructive sleep apnea)     Comment:  a.) mild; does not require nocturnal PAP therapy No date: Osteopenia No date: Pneumonia No date: Skin cancer No date: T2DM (type 2  diabetes mellitus) (HCC) No date: Tinnitus 01/25/2021: Vertebral artery stenosis, bilateral     Comment:  a.) CTA neck 01/25/2021: severe LEFT and moderate RIGHT               vertebral artery origin stenosis No date: Vertigo  Past Surgical History: 05/01/2020: CARPAL TUNNEL RELEASE; Right     Comment:  Procedure: CARPAL TUNNEL RELEASE ENDOSCOPIC;  Surgeon:               Christena Flake, MD;  Location: [facility] ORS;  Service:               Orthopedics;  Laterality: Right; No date: colonic polyps 02/23/2017: COLONOSCOPY WITH PROPOFOL; N/A     Comment:  Procedure: COLONOSCOPY WITH PROPOFOL;  Surgeon: Scot Jun, MD;  Location: [facility] ENDOSCOPY;  Service:               Endoscopy;  Laterality: N/A; 03/04/2021: LOOP RECORDER IMPLANT; N/A     Comment:  Procedure: LOOP RECORDER IMPLANT; Location: [facility];               Surgeon: Cephas Darby, MD No date: skin cancer removal; Bilateral     Comment:  arms 05/01/2020: TRIGGER FINGER RELEASE; Right     Comment:  Procedure: RELEASE TRIGGER FINGER/A-1 PULLEY;  Surgeon:  Poggi, Excell Seltzer, MD;  Location: [facility] ORS;  Service:               Orthopedics;  Laterality: Right; No date: TUBAL LIGATION  BMI    Body Mass Index: 26.29 kg/m      Reproductive/Obstetrics negative OB ROS                             Anesthesia Physical Anesthesia Plan  ASA: 3  Anesthesia Plan: Spinal   Post-op Pain Management:    Induction:   PONV Risk Score and Plan: Ondansetron, Midazolam, TIVA, Propofol infusion and Treatment may vary due to age or medical condition  Airway Management Planned: Natural Airway and Nasal Cannula  Additional Equipment:   Intra-op Plan:   Post-operative Plan:   Informed Consent: I have reviewed the patients History and Physical, chart, labs and discussed the procedure including the risks, benefits and alternatives for the proposed anesthesia with the patient or authorized  representative who has indicated his/her understanding and acceptance.     Dental Advisory Given  Plan Discussed with: Anesthesiologist, CRNA and Surgeon  Anesthesia Plan Comments: (Patient reports no bleeding problems and no anticoagulant use.  Plan for spinal with backup GA  Patient consented for risks of anesthesia including but not limited to:  - adverse reactions to medications - damage to eyes, teeth, lips or other oral mucosa - nerve damage due to positioning  - risk of bleeding, infection and or nerve damage from spinal that could lead to paralysis - risk of headache or failed spinal - damage to teeth, lips or other oral mucosa - sore throat or hoarseness - damage to heart, brain, nerves, lungs, other parts of body or loss of life  Patient voiced understanding.)       Anesthesia Quick Evaluation

## 2022-11-02 ENCOUNTER — Other Ambulatory Visit: Payer: Self-pay

## 2022-11-02 DIAGNOSIS — I129 Hypertensive chronic kidney disease with stage 1 through stage 4 chronic kidney disease, or unspecified chronic kidney disease: Secondary | ICD-10-CM | POA: Diagnosis not present

## 2022-11-02 DIAGNOSIS — Z7984 Long term (current) use of oral hypoglycemic drugs: Secondary | ICD-10-CM | POA: Diagnosis not present

## 2022-11-02 DIAGNOSIS — N182 Chronic kidney disease, stage 2 (mild): Secondary | ICD-10-CM | POA: Diagnosis not present

## 2022-11-02 DIAGNOSIS — Z79899 Other long term (current) drug therapy: Secondary | ICD-10-CM | POA: Diagnosis not present

## 2022-11-02 DIAGNOSIS — E1122 Type 2 diabetes mellitus with diabetic chronic kidney disease: Secondary | ICD-10-CM | POA: Diagnosis not present

## 2022-11-02 DIAGNOSIS — M1611 Unilateral primary osteoarthritis, right hip: Secondary | ICD-10-CM | POA: Diagnosis not present

## 2022-11-02 DIAGNOSIS — Z8673 Personal history of transient ischemic attack (TIA), and cerebral infarction without residual deficits: Secondary | ICD-10-CM | POA: Diagnosis not present

## 2022-11-02 DIAGNOSIS — Z794 Long term (current) use of insulin: Secondary | ICD-10-CM | POA: Diagnosis not present

## 2022-11-02 DIAGNOSIS — Z7982 Long term (current) use of aspirin: Secondary | ICD-10-CM | POA: Diagnosis not present

## 2022-11-02 LAB — GLUCOSE, CAPILLARY: Glucose-Capillary: 147 mg/dL — ABNORMAL HIGH (ref 70–99)

## 2022-11-02 MED ORDER — SENNOSIDES-DOCUSATE SODIUM 8.6-50 MG PO TABS
ORAL_TABLET | ORAL | Status: AC
  Filled 2022-11-02: qty 1

## 2022-11-02 MED ORDER — TRAMADOL HCL 50 MG PO TABS: 50.0000 mg | ORAL_TABLET | ORAL | 0 refills | Status: DC | PRN

## 2022-11-02 MED ORDER — OXYCODONE HCL 5 MG PO TABS
ORAL_TABLET | ORAL | Status: AC
  Filled 2022-11-02: qty 1

## 2022-11-02 MED ORDER — PANTOPRAZOLE SODIUM 40 MG PO TBEC
DELAYED_RELEASE_TABLET | ORAL | Status: AC
  Filled 2022-11-02: qty 1

## 2022-11-02 MED ORDER — OXYCODONE HCL 5 MG PO TABS: 5.0000 mg | ORAL_TABLET | ORAL | 0 refills | Status: DC | PRN

## 2022-11-02 MED ORDER — METFORMIN HCL 500 MG PO TABS
ORAL_TABLET | ORAL | Status: AC
  Filled 2022-11-02: qty 2

## 2022-11-02 MED ORDER — CELECOXIB 200 MG PO CAPS: 200.0000 mg | ORAL_CAPSULE | Freq: Two times a day (BID) | ORAL | 1 refills | Status: AC

## 2022-11-02 MED ORDER — ASPIRIN 81 MG PO CHEW
CHEWABLE_TABLET | ORAL | Status: AC
  Filled 2022-11-02: qty 1

## 2022-11-02 MED ORDER — METOCLOPRAMIDE HCL 10 MG PO TABS
ORAL_TABLET | ORAL | Status: AC
  Filled 2022-11-02: qty 1

## 2022-11-02 MED ORDER — ACETAMINOPHEN 10 MG/ML IV SOLN
INTRAVENOUS | Status: AC
  Filled 2022-11-02: qty 100

## 2022-11-02 MED ORDER — FERROUS SULFATE 325 (65 FE) MG PO TABS
ORAL_TABLET | ORAL | Status: AC
  Filled 2022-11-02: qty 1

## 2022-11-02 MED ORDER — TRAMADOL HCL 50 MG PO TABS: 50.0000 mg | ORAL_TABLET | ORAL | 0 refills | Status: AC | PRN

## 2022-11-02 MED ORDER — ASPIRIN EC 81 MG PO TBEC: 81.0000 mg | DELAYED_RELEASE_TABLET | Freq: Two times a day (BID) | ORAL | Status: AC

## 2022-11-02 MED ORDER — CELECOXIB 200 MG PO CAPS
ORAL_CAPSULE | ORAL | Status: AC
  Filled 2022-11-02: qty 1

## 2022-11-02 MED ORDER — OXYBUTYNIN CHLORIDE 5 MG PO TABS
ORAL_TABLET | ORAL | Status: AC
  Filled 2022-11-02: qty 1

## 2022-11-02 MED ORDER — OXYCODONE HCL 5 MG PO TABS: 5.0000 mg | ORAL_TABLET | ORAL | 0 refills | Status: AC | PRN

## 2022-11-02 MED ORDER — CARVEDILOL 12.5 MG PO TABS
ORAL_TABLET | ORAL | Status: AC
  Filled 2022-11-02: qty 1

## 2022-11-02 MED ORDER — INSULIN ASPART 100 UNIT/ML IJ SOLN
INTRAMUSCULAR | Status: AC
  Filled 2022-11-02: qty 1

## 2022-11-02 MED ORDER — CELECOXIB 200 MG PO CAPS: 200.0000 mg | ORAL_CAPSULE | Freq: Two times a day (BID) | ORAL | 1 refills | Status: DC

## 2022-11-02 MED ORDER — MAGNESIUM HYDROXIDE 400 MG/5ML PO SUSP
ORAL | Status: AC
  Filled 2022-11-02: qty 30

## 2022-11-02 NOTE — Plan of Care (Signed)
  Problem: Fluid Volume: Goal: Ability to maintain a balanced intake and output will improve Outcome: Progressing   Problem: Nutritional: Goal: Maintenance of adequate nutrition will improve Outcome: Progressing   Problem: Tissue Perfusion: Goal: Adequacy of tissue perfusion will improve Outcome: Progressing   Problem: Education: Goal: Knowledge of the prescribed therapeutic regimen will improve Outcome: Progressing   Problem: Activity: Goal: Ability to avoid complications of mobility impairment will improve Outcome: Progressing

## 2022-11-02 NOTE — Progress Notes (Addendum)
Physical Therapy Treatment Patient Details Name: Morgan Nicholson MRN: [id number] DOB: 21-Jan-1946 Today's Date: 11/02/2022   History of Present Illness Pt is a 77yo female presenting s/p R THA. PMH pertinent for stroke (2022), hip OA, HTN, CHF, DM, and renal disease.    PT Comments  Pt presents seated EOB following OT treatment with family in the room. Current complaints of 10/10 pain when standing, described as soreness but was willing to continue with treatment. Pt was able to perform sit<>stand and ambulate ~226ft with RW and CGA for safety. Pt was also able to ascend/descend 5 stairs with CGA and teach back safe stair negotiation. PT reviewed precautions throughout session and Pt was able to verbalize 3/3 by the end of the session. She would benefit from continued skilled therapy to maximize functional independence.     Assistance Recommended at Discharge Intermittent Supervision/Assistance  If plan is discharge home, recommend the following:  Can travel by private vehicle    A little help with walking and/or transfers;A little help with bathing/dressing/bathroom;Assist for transportation;Direct supervision/assist for medications management;Help with stairs or ramp for entrance;Assistance with cooking/housework      Equipment Recommendations  None recommended by PT (Borrowing equipment from family)    Recommendations for Other Services       Precautions / Restrictions Precautions Precautions: Posterior Hip;Fall Precaution Booklet Issued: Yes (comment) Precaution Comments: Pt able to recall 3/3 at end of PT session Restrictions Weight Bearing Restrictions: Yes RLE Weight Bearing: Weight bearing as tolerated     Mobility  Bed Mobility               General bed mobility comments: Pt sitting at EOB at start of PT session    Transfers Overall transfer level: Needs assistance Equipment used: Rolling walker (2 wheels) Transfers: Sit to/from Stand Sit to Stand: Min guard                 Ambulation/Gait Ambulation/Gait assistance: Min guard Gait Distance (Feet): 220 Feet Assistive device: Rolling walker (2 wheels) Gait Pattern/deviations: Decreased stance time - right, Decreased stride length Gait velocity: decreased         Stairs Stairs: Yes Stairs assistance: Min guard Stair Management: Two rails, Step to pattern Number of Stairs: 5 General stair comments: Able to recall and perform safe stair negotiation with CGA for safety   Wheelchair Mobility     Tilt Bed    Modified Rankin (Stroke Patients Only)       Balance Overall balance assessment: Needs assistance Sitting-balance support: Feet supported, No upper extremity supported Sitting balance-Leahy Scale: Good Sitting balance - Comments: Pt sitting EOB upon entering   Standing balance support: During functional activity, Reliant on assistive device for balance Standing balance-Leahy Scale: Fair                              Cognition Arousal/Alertness: Awake/alert Behavior During Therapy: WFL for tasks assessed/performed Overall Cognitive Status: Within Functional Limits for tasks assessed                                          Exercises Total Joint Exercises Long Arc Quad: AAROM, Right, 10 reps, Seated    General Comments General comments (skin integrity, edema, etc.): Improved wooziness since yesterday      Pertinent Vitals/Pain Pain Assessment Pain Score: 10-Worst pain  ever Pain Location: R hip Pain Descriptors / Indicators: Discomfort, Sore, Constant (willing to participate in therapy, no pain posturing present) Pain Intervention(s): Monitored during session    Home Living Family/patient expects to be discharged to:: Private residence Living Arrangements: Alone   Type of Home: Apartment Home Access: Elevator       Home Layout: One level Home Equipment: Agricultural consultant (2 wheels);Rollator (4 wheels);Cane - single  point;BSC/3in1;Shower seat;Adaptive equipment      Prior Function            PT Goals (current goals can now be found in the care plan section) Acute Rehab PT Goals Patient Stated Goal: Return home PT Goal Formulation: With patient Time For Goal Achievement: 11/15/22 Potential to Achieve Goals: Good Progress towards PT goals: Progressing toward goals    Frequency    BID      PT Plan Current plan remains appropriate    Co-evaluation              AM-PAC PT "6 Clicks" Mobility   Outcome Measure  Help needed turning from your back to your side while in a flat bed without using bedrails?: None Help needed moving from lying on your back to sitting on the side of a flat bed without using bedrails?: A Little Help needed moving to and from a bed to a chair (including a wheelchair)?: A Little Help needed standing up from a chair using your arms (e.g., wheelchair or bedside chair)?: A Little Help needed to walk in hospital room?: A Little Help needed climbing 3-5 steps with a railing? : A Little 6 Click Score: 19    End of Session Equipment Utilized During Treatment: Gait belt Activity Tolerance: Patient tolerated treatment well Patient left: in chair;with call bell/phone within reach;with family/visitor present Nurse Communication:  (Pain levels) PT Visit Diagnosis: Muscle weakness (generalized) (M62.81);Difficulty in walking, not elsewhere classified (R26.2)     Time: 2956-2130 PT Time Calculation (min) (ACUTE ONLY): 25 min  Charges:    $Gait Training: 8-22 mins $Therapeutic Activity: 8-22 mins PT General Charges $$ ACUTE PT VISIT: 1 Visit                     Aaronmichael Brumbaugh, PT, SPT 10:46 AM,11/02/22

## 2022-11-02 NOTE — Progress Notes (Signed)
Patient is not able to walk the distance required to go the bathroom, or he/she is unable to safely negotiate stairs required to access the bathroom.  A 3in1 BSC will alleviate this problem   James P. Hooten, Jr. M.D.  

## 2022-11-02 NOTE — Anesthesia Postprocedure Evaluation (Signed)
Anesthesia Post Note  Patient: Morgan Nicholson  Procedure(s) Performed: TOTAL HIP ARTHROPLASTY (Right: Hip)  Patient location during evaluation: Nursing Unit Anesthesia Type: Spinal Level of consciousness: oriented and awake and alert Pain management: pain level controlled Vital Signs Assessment: post-procedure vital signs reviewed and stable Respiratory status: spontaneous breathing and respiratory function stable Cardiovascular status: blood pressure returned to baseline and stable Postop Assessment: no headache, no backache, no apparent nausea or vomiting and patient able to bend at knees Anesthetic complications: no   No notable events documented.   Last Vitals:  Vitals:   11/01/22 2233 11/02/22 0402  BP: 114/68 (!) 140/77  Pulse: 70 70  Resp: 18 18  Temp: 36.6 C 36.7 C  SpO2: 95% 94%    Last Pain:  Vitals:   11/02/22 0402  TempSrc: Temporal  PainSc:                  Karoline Caldwell

## 2022-11-02 NOTE — Evaluation (Signed)
Occupational Therapy Evaluation Patient Details Name: Morgan Nicholson MRN: [id number] DOB: 1946-01-22 Today's Date: 11/02/2022   History of Present Illness Pt is a 77yo female presenting s/p R THA. PMH pertinent for stroke (2022), hip OA, HTN, CHF, DM, and renal disease.   Clinical Impression   Pt seen for OT evaluation this date, POD#1 from above surgery. Pt was independent in all ADL prior to surgery and is eager to return to PLOF with less pain and improved safety and independence. Pt currently requires PRN minimal assist for LB dressing and bathing while in seated position due to pain and limited AROM of R hip. Pt able to recall 1/3 posterior total hip precautions at start of session and unable to verbalize how to implement during ADL and mobility. Pt/family instructed in posterior total hip precautions and how to implement, self care skills, falls prevention strategies, home/routines modifications, DME/AE for LB bathing and dressing tasks, compression stocking mgt strategies, and car transfer techniques. At end of session, pt able to recall 3/3 posterior total hip precautions. Pt would benefit from additional instruction in self care skills and techniques to help maintain precautions with or without assistive devices to support recall and carryover prior to discharge.    Recommendations for follow up therapy are one component of a multi-disciplinary discharge planning process, led by the attending physician.  Recommendations may be updated based on patient status, additional functional criteria and insurance authorization.   Assistance Recommended at Discharge PRN  Patient can return home with the following A little help with walking and/or transfers;A little help with bathing/dressing/bathroom;Assistance with cooking/housework;Assist for transportation;Help with stairs or ramp for entrance    Functional Status Assessment  Patient has had a recent decline in their functional status and  demonstrates the ability to make significant improvements in function in a reasonable and predictable amount of time.  Equipment Recommendations  None recommended by OT    Recommendations for Other Services       Precautions / Restrictions Precautions Precautions: Posterior Hip;Fall Precaution Booklet Issued: Yes (comment) Precaution Comments: Pt able to recall 1/3 at start of session, 3/3 at end of OT session Restrictions Weight Bearing Restrictions: Yes RLE Weight Bearing: Weight bearing as tolerated      Mobility Bed Mobility               General bed mobility comments: NT EOB at start and end of OT Session    Transfers Overall transfer level: Needs assistance Equipment used: Rolling walker (2 wheels) Transfers: Sit to/from Stand Sit to Stand: Min guard           General transfer comment: VC for foot placement to maintain precautions during repeated STSs      Balance Overall balance assessment: Needs assistance Sitting-balance support: Feet supported, No upper extremity supported Sitting balance-Leahy Scale: Good     Standing balance support: No upper extremity supported, During functional activity Standing balance-Leahy Scale: Fair Standing balance comment: fair static standing balance to complete donning of underwear over hips without UE support on RW but while standing inside RW frame                           ADL either performed or assessed with clinical judgement   ADL  General ADL Comments: Pt currently requires supv and VC for technique to don LB dressing using reacher after initial instruction, CGA for ADL Transfers, CGA for ADL mobility with RW and PRN VC for precautions     Vision         Perception     Praxis      Pertinent Vitals/Pain       Hand Dominance     Extremity/Trunk Assessment Upper Extremity Assessment Upper Extremity Assessment: Overall WFL for  tasks assessed   Lower Extremity Assessment Lower Extremity Assessment: Generalized weakness;RLE deficits/detail RLE Deficits / Details: s/p THA, expected strength deficits       Communication Communication Communication: No difficulties   Cognition Arousal/Alertness: Awake/alert Behavior During Therapy: WFL for tasks assessed/performed Overall Cognitive Status: Within Functional Limits for tasks assessed                                       General Comments       Exercises Other Exercises Other Exercises: Pt/family educated in hip precautions and how to maintain during ADL and mobility, RW mgt, falls prevention, AE/DME for LB ADL, home/routines modifications, facilitated problem solving for optimal sleep and rest positioning to maintain precautions.   Shoulder Instructions      Home Living Family/patient expects to be discharged to:: Private residence Living Arrangements: Alone   Type of Home: Apartment Home Access: Elevator     Home Layout: One level               Home Equipment: Agricultural consultant (2 wheels);Rollator (4 wheels);Cane - single point;BSC/3in1;Shower seat;Adaptive equipment Adaptive Equipment: Reacher;Long-handled shoe horn;Long-handled sponge;Sock aid        Prior Functioning/Environment Prior Level of Function : Independent/Modified Independent                        OT Problem List: Decreased strength;Impaired balance (sitting and/or standing);Decreased knowledge of use of DME or AE;Decreased knowledge of precautions      OT Treatment/Interventions: Self-care/ADL training;Therapeutic exercise;Therapeutic activities;DME and/or AE instruction;Patient/family education;Balance training    OT Goals(Current goals can be found in the care plan section) Acute Rehab OT Goals Patient Stated Goal: be independent OT Goal Formulation: With patient/family Time For Goal Achievement: 11/16/22 Potential to Achieve Goals: Good ADL  Goals Pt Will Perform Lower Body Dressing: with modified independence;with adaptive equipment;sit to/from stand Pt Will Transfer to Toilet: with modified independence;ambulating (elevated commode, LRAD) Pt Will Perform Toileting - Clothing Manipulation and hygiene: with modified independence Additional ADL Goal #1: Pt will independently instruct family/caregiver in compression stocking mgt. Additional ADL Goal #2: Pt will recall 3/3 posterior hip precautions requiring PRN VC to maintain during ADL/mobility.  OT Frequency: Min 1X/week    Co-evaluation              AM-PAC OT "6 Clicks" Daily Activity     Outcome Measure Help from another person eating meals?: None Help from another person taking care of personal grooming?: None Help from another person toileting, which includes using toliet, bedpan, or urinal?: A Little Help from another person bathing (including washing, rinsing, drying)?: A Little Help from another person to put on and taking off regular upper body clothing?: None Help from another person to put on and taking off regular lower body clothing?: A Little 6 Click Score: 21   End of Session Equipment Utilized During Treatment:  Rolling walker (2 wheels) Nurse Communication: Mobility status  Activity Tolerance: Patient tolerated treatment well Patient left: in bed;with call bell/phone within reach;with family/visitor present;Other (comment) (PT)  OT Visit Diagnosis: Other abnormalities of gait and mobility (R26.89);Muscle weakness (generalized) (M62.81)                Time: 5284-1324 OT Time Calculation (min): 22 min Charges:  OT General Charges $OT Visit: 1 Visit OT Evaluation $OT Eval Low Complexity: 1 Low OT Treatments $Self Care/Home Management : 8-22 mins  Arman Filter., MPH, MS, OTR/L ascom [phone] 11/02/22, 10:30 AM

## 2022-11-02 NOTE — Progress Notes (Signed)
Subjective: 1 Day Post-Op Procedure(s) (LRB): TOTAL HIP ARTHROPLASTY (Right) Patient reports pain as mild.  States that it does increase, when she tries to get up and walk.  Patient reports that she was doing well with PT yesterday. Patient seen in rounds with Dr. Ernest Pine. Patient is well, and has had no acute complaints or problems.  Denies any chest pain, shortness of breath, N/V, fevers or chills. We will continue with therapy today.  Plan is to go Home after hospital stay.  Objective: Vital signs in last 24 hours: Temp:  [97 F (36.1 C)-98 F (36.7 C)] 98 F (36.7 C) (07/23 0402) Pulse Rate:  [59-79] 70 (07/23 0402) Resp:  [8-21] 18 (07/23 0402) BP: (106-140)/(58-88) 140/77 (07/23 0402) SpO2:  [92 %-98 %] 94 % (07/23 0402)  Intake/Output from previous day:  Intake/Output Summary (Last 24 hours) at 11/02/2022 0803 Last data filed at 11/02/2022 0300 Gross per 24 hour  Intake 2705.39 ml  Output 1320 ml  Net 1385.39 ml    Intake/Output this shift: No intake/output data recorded.  Labs: No results for input(s): "HGB" in the last 72 hours. No results for input(s): "WBC", "RBC", "HCT", "PLT" in the last 72 hours. No results for input(s): "NA", "K", "CL", "CO2", "BUN", "CREATININE", "GLUCOSE", "CALCIUM" in the last 72 hours. No results for input(s): "LABPT", "INR" in the last 72 hours.  EXAM General - Patient is Alert, Appropriate, and Oriented Extremity - Neurologically intact ABD soft Neurovascular intact Sensation intact distally Intact pulses distally Dorsiflexion/Plantar flexion intact No cellulitis present Compartment soft Dressing - dressing C/D/I and no drainage Motor Function - intact, moving foot and toes well on exam.  Patient able to plantar and dorsiflex with good strength and range of motion.  Patient does have some difficulty performing and SLR but, is able to do so with assistance.  Patient is neurovascularly intact down her right lower extremity to all lower  leg dermatomes.  Posterior tibial pulses appreciated 2+ JP Drain pulled without difficulty. Intact  Past Medical History:  Diagnosis Date   Adenomatous polyp 08/29/2013   Aortic atherosclerosis (HCC)    Arthritis of both hips    Bilateral carotid artery disease (HCC) 01/24/2021   a.) CTA neck 01/24/2021: 55% RICA, 45% LICA   Bilateral cataracts    Cervical spinal stenosis    CKD (chronic kidney disease), stage III (HCC)    Diastolic dysfunction 01/25/2021   a.) TTE 01/25/2021: EF 60-65%, mild LVH, mild LA dil, G1DD   High cholesterol    Hyperplastic colon polyp    Hypertension    Implantable loop recorder present 03/04/2021   a.) Linq II device   Ischemic stroke (HCC) 01/24/2021   a.) MRI brain 01/24/2021: acute ischemia at the base of the LEFT central sulcus   OAB (overactive bladder)    OSA (obstructive sleep apnea)    a.) mild; does not require nocturnal PAP therapy   Osteopenia    Pneumonia    Skin cancer    T2DM (type 2 diabetes mellitus) (HCC)    Tinnitus    Vertebral artery stenosis, bilateral 01/25/2021   a.) CTA neck 01/25/2021: severe LEFT and moderate RIGHT vertebral artery origin stenosis   Vertigo     Assessment/Plan: 1 Day Post-Op Procedure(s) (LRB): TOTAL HIP ARTHROPLASTY (Right) Principal Problem:   Hx of total hip arthroplasty, right  Estimated body mass index is 26.29 kg/m as calculated from the following:   Height as of this encounter: 5\' 5"  (1.651 m).   Weight  as of this encounter: 71.7 kg. Advance diet Up with therapy  Patient will continue to work with physical therapy to pass postoperative PT protocols, ROM and strengthening  Hip Preacutions  Discussed with the patient continuing to utilize ice over the bandage  Patient will wear TED hose bilaterally to help prevent DVT and clot formation  Discussed the Aquacel bandage.  This bandage will stay in place 7 days postoperatively.  Can be replaced with honeycomb bandages that will be sent home  with the patient  Discussed sending the patient home with tramadol and oxycodone for as needed pain management.  Patient will also be sent home with Celebrex to help with swelling and inflammation.  Patient will take an 81 mg aspirin twice daily for DVT prophylaxis  JP drain removed without difficulty, intact  Weight-Bearing as tolerated to right leg  Patient will follow-up with [facility] in 6 weeks for re-imaging and reevaluation   Rayburn Go, PA-C [facility] 11/02/2022, 8:03 AM

## 2022-11-02 NOTE — Discharge Summary (Cosign Needed Addendum)
Physician Discharge Summary  Subjective: 1 Day Post-Op Procedure(s) (LRB): TOTAL HIP ARTHROPLASTY (Right) Patient reports pain as mild.   Patient seen in rounds with Dr. Ernest Pine. Patient is well, and has had no acute complaints or problems. Patient denies any SOB, CP, N/V, fevers or chills. States she feels like she has to move her bowels. Patient is ready to go home.  Physician Discharge Summary  Patient ID: Morgan Nicholson MRN: [id number] DOB/AGE: [birth year] 77 y.o.  Admit date: 11/01/2022 Discharge date: 11/02/2022  Admission Diagnoses:  Discharge Diagnoses:  Principal Problem:   Hx of total hip arthroplasty, right   Discharged Condition: good  Hospital Course: Patient presented to the hospital on 11/01/2022 for an elective right total hip arthroplasty performed by Dr. Francesco Sor.  Patient was given 1 g of TXA and 2 g of Ancef perioperatively.  Patient tolerated the surgery well without any complications.  See operative details below.  Postoperatively, patient was able to work with physical therapy on the same day of the procedure and was able to ambulate 80 feet.  On postop day 1 she was able to pass her PT protocols.  There is minimal drainage in her JP drain which was removed, intact.  She denies having any issues including chest pain, SOB, N/V, fevers or chills.  Physical exam is unremarkable.  Vital signs are stable.  Patient is stable for discharge  PROCEDURE:  Right total hip arthroplasty   SURGEON:  Jena Gauss. M.D.   ASSISTANT:  Gean Birchwood, PA-C (present and scrubbed throughout the case, critical for assistance with exposure, retraction, instrumentation, and closure)   ANESTHESIA: spinal   ESTIMATED BLOOD LOSS: 100 mL   FLUIDS REPLACED: 800 mL of crystalloid   DRAINS: 2 medium Hemovac drains   IMPLANTS UTILIZED: DePuy size 5 standard offset Actis femoral stem, 50 mm OD Pinnacle 100 acetabular component, +4 mm 10 degree Pinnacle Altrx polyethylene insert, and  a 32 mm CoCr +1 mm hip ball  Treatments: none  Discharge Exam: Blood pressure (!) 140/77, pulse 70, temperature 98 F (36.7 C), temperature source Temporal, resp. rate 18, height 5\' 5"  (1.651 m), weight 71.7 kg, SpO2 94%.   Disposition: home   Allergies as of 11/02/2022   No Known Allergies      Medication List     TAKE these medications    acetaminophen 500 MG tablet Commonly known as: TYLENOL Take 1,000 mg by mouth every 6 (six) hours as needed for mild pain.   amLODipine 5 MG tablet Commonly known as: NORVASC Take 5 mg by mouth daily.   aspirin EC 81 MG tablet Take 1 tablet (81 mg total) by mouth in the morning and at bedtime. Take it with your plavix What changed: when to take this   atorvastatin 80 MG tablet Commonly known as: LIPITOR Take 80 mg by mouth every evening.   CALCIUM 600+D3 PO Take 1 tablet by mouth in the morning and at bedtime.   calcium carbonate 750 MG chewable tablet Commonly known as: TUMS EX Chew 1 tablet by mouth as needed for heartburn.   carvedilol 12.5 MG tablet Commonly known as: COREG Take 12.5 mg by mouth 2 (two) times daily.   celecoxib 200 MG capsule Commonly known as: CELEBREX Take 1 capsule (200 mg total) by mouth 2 (two) times daily.   Dexcom G6 Receiver Pharmacologist for glucose monitoring   Dexcom G7 Sensor Misc Use 1 Device every 10 (ten) days   fluocinonide 0.05 %  external solution Commonly known as: LIDEX Apply 1 application topically daily as needed (scalp dermatitis).   glimepiride 2 MG tablet Commonly known as: AMARYL Take 2 mg by mouth daily with supper.   ketoconazole 2 % shampoo Commonly known as: NIZORAL Apply 1 application  topically 2 (two) times a week.   lisinopril 40 MG tablet Commonly known as: ZESTRIL Take 40 mg daily by mouth.   metFORMIN 1000 MG tablet Commonly known as: GLUCOPHAGE Take 1,000 mg by mouth 2 (two) times daily with a meal.   oxybutynin 5 MG tablet Commonly known  as: DITROPAN Take 5 mg by mouth 3 (three) times daily.   oxyCODONE 5 MG immediate release tablet Commonly known as: Oxy IR/ROXICODONE Take 1 tablet (5 mg total) by mouth every 4 (four) hours as needed for moderate pain (pain score 4-6).   SOLIQUA [place] Inject 19 Units into the skin daily. In the morning   traMADol 50 MG tablet Commonly known as: ULTRAM Take 1-2 tablets (50-100 mg total) by mouth every 4 (four) hours as needed for moderate pain.               Durable Medical Equipment  (From admission, onward)           Start     Ordered   11/01/22 1309  DME Walker rolling  Once       Question:  Patient needs a walker to treat with the following condition  Answer:  S/P total hip arthroplasty   11/01/22 1308   11/01/22 1309  DME Bedside commode  Once       Comments: Patient is not able to walk the distance required to go the bathroom, or he/she is unable to safely negotiate stairs required to access the bathroom.  A 3in1 BSC will alleviate this problem  Question:  Patient needs a bedside commode to treat with the following condition  Answer:  S/P total hip arthroplasty   11/01/22 1308            Follow-up Information     Hooten, Illene Labrador, MD Follow up on 12/14/2022.   Specialty: Orthopedic Surgery Why: at 9:45am Contact information: [address] [facility] [place] [address] [phone]                 Signed: Gean Birchwood 11/02/2022, 8:19 AM   Objective: Vital signs in last 24 hours: Temp:  [97 F (36.1 C)-98 F (36.7 C)] 98 F (36.7 C) (07/23 0402) Pulse Rate:  [59-79] 70 (07/23 0402) Resp:  [8-21] 18 (07/23 0402) BP: (106-140)/(58-88) 140/77 (07/23 0402) SpO2:  [92 %-98 %] 94 % (07/23 0402)  Intake/Output from previous day:  Intake/Output Summary (Last 24 hours) at 11/02/2022 0819 Last data filed at 11/02/2022 0300 Gross per 24 hour  Intake 2705.39 ml  Output 1320 ml  Net 1385.39 ml    Intake/Output this shift: No  intake/output data recorded.  Labs: No results for input(s): "HGB" in the last 72 hours. No results for input(s): "WBC", "RBC", "HCT", "PLT" in the last 72 hours. No results for input(s): "NA", "K", "CL", "CO2", "BUN", "CREATININE", "GLUCOSE", "CALCIUM" in the last 72 hours. No results for input(s): "LABPT", "INR" in the last 72 hours.  EXAM: General - Patient is Alert, Appropriate, and Oriented Extremity - Neurologically intact ABD soft Neurovascular intact Sensation intact distally Intact pulses distally Dorsiflexion/Plantar flexion intact No cellulitis present Compartment soft Dressing - dressing C/D/I and no drainage Motor Function - intact, moving  foot and toes well on exam.  Patient able to plantar and dorsiflex with good strength and range of motion.  Patient does have some difficulty performing and SLR but, is able to do so with assistance.  Patient is neurovascularly intact down her right lower extremity to all lower leg dermatomes.  Posterior tibial pulses appreciated 2+ JP Drain pulled without difficulty. Intact  Assessment/Plan: 1 Day Post-Op Procedure(s) (LRB): TOTAL HIP ARTHROPLASTY (Right) Procedure(s) (LRB): TOTAL HIP ARTHROPLASTY (Right) Past Medical History:  Diagnosis Date   Adenomatous polyp 08/29/2013   Aortic atherosclerosis (HCC)    Arthritis of both hips    Bilateral carotid artery disease (HCC) 01/24/2021   a.) CTA neck 01/24/2021: 55% RICA, 45% LICA   Bilateral cataracts    Cervical spinal stenosis    CKD (chronic kidney disease), stage III (HCC)    Diastolic dysfunction 01/25/2021   a.) TTE 01/25/2021: EF 60-65%, mild LVH, mild LA dil, G1DD   High cholesterol    Hyperplastic colon polyp    Hypertension    Implantable loop recorder present 03/04/2021   a.) Linq II device   Ischemic stroke (HCC) 01/24/2021   a.) MRI brain 01/24/2021: acute ischemia at the base of the LEFT central sulcus   OAB (overactive bladder)    OSA (obstructive sleep apnea)     a.) mild; does not require nocturnal PAP therapy   Osteopenia    Pneumonia    Skin cancer    T2DM (type 2 diabetes mellitus) (HCC)    Tinnitus    Vertebral artery stenosis, bilateral 01/25/2021   a.) CTA neck 01/25/2021: severe LEFT and moderate RIGHT vertebral artery origin stenosis   Vertigo    Principal Problem:   Hx of total hip arthroplasty, right  Estimated body mass index is 26.29 kg/m as calculated from the following:   Height as of this encounter: 5\' 5"  (1.651 m).   Weight as of this encounter: 71.7 kg. Patient has passed all of her PT protocols and is stable for discharge.  She will look to transition to working with home health PT for the next 2 weeks.  Then will transition to outpatient PT.  Continue to work on strengthening and ambulation.   Hip Preacutions   Discussed with the patient continuing to utilize ice over the bandage   Patient will wear TED hose bilaterally to help prevent DVT and clot formation   Discussed the Aquacel bandage.  This bandage will stay in place 7 days postoperatively.  Can be replaced with honeycomb bandages that will be sent home with the patient   Discussed sending the patient home with tramadol and oxycodone for as needed pain management.  Patient will also be sent home with Celebrex to help with swelling and inflammation.  Patient will take an 81 mg aspirin twice daily for DVT prophylaxis   JP drain removed without difficulty, intact   Weight-Bearing as tolerated to right leg   Patient will follow-up with [facility] orth[facility]ics in 6 weeks for re-imaging and reevaluation  Diet - Diabetic diet Follow up - in 6 weeks Activity - WBAT Disposition - Home Condition Upon Discharge - Good DVT Prophylaxis - Aspirin  Danise Edge, PA-C Orthopaedic Surgery 11/02/2022, 8:19 AM

## 2022-11-03 DIAGNOSIS — N183 Chronic kidney disease, stage 3 unspecified: Secondary | ICD-10-CM | POA: Diagnosis not present

## 2022-11-03 DIAGNOSIS — I700 Atherosclerosis of aorta: Secondary | ICD-10-CM | POA: Diagnosis not present

## 2022-11-03 DIAGNOSIS — Z96641 Presence of right artificial hip joint: Secondary | ICD-10-CM | POA: Diagnosis not present

## 2022-11-03 DIAGNOSIS — E7800 Pure hypercholesterolemia, unspecified: Secondary | ICD-10-CM | POA: Diagnosis not present

## 2022-11-03 DIAGNOSIS — Z471 Aftercare following joint replacement surgery: Secondary | ICD-10-CM | POA: Diagnosis not present

## 2022-11-03 DIAGNOSIS — M4802 Spinal stenosis, cervical region: Secondary | ICD-10-CM | POA: Diagnosis not present

## 2022-11-03 DIAGNOSIS — E1136 Type 2 diabetes mellitus with diabetic cataract: Secondary | ICD-10-CM | POA: Diagnosis not present

## 2022-11-03 DIAGNOSIS — E1122 Type 2 diabetes mellitus with diabetic chronic kidney disease: Secondary | ICD-10-CM | POA: Diagnosis not present

## 2022-11-03 DIAGNOSIS — I1310 Hypertensive heart and chronic kidney disease without heart failure, with stage 1 through stage 4 chronic kidney disease, or unspecified chronic kidney disease: Secondary | ICD-10-CM | POA: Diagnosis not present

## 2022-11-04 DIAGNOSIS — E1136 Type 2 diabetes mellitus with diabetic cataract: Secondary | ICD-10-CM | POA: Diagnosis not present

## 2022-11-04 DIAGNOSIS — E1122 Type 2 diabetes mellitus with diabetic chronic kidney disease: Secondary | ICD-10-CM | POA: Diagnosis not present

## 2022-11-04 DIAGNOSIS — M4802 Spinal stenosis, cervical region: Secondary | ICD-10-CM | POA: Diagnosis not present

## 2022-11-04 DIAGNOSIS — I1310 Hypertensive heart and chronic kidney disease without heart failure, with stage 1 through stage 4 chronic kidney disease, or unspecified chronic kidney disease: Secondary | ICD-10-CM | POA: Diagnosis not present

## 2022-11-04 DIAGNOSIS — I700 Atherosclerosis of aorta: Secondary | ICD-10-CM | POA: Diagnosis not present

## 2022-11-04 DIAGNOSIS — E7800 Pure hypercholesterolemia, unspecified: Secondary | ICD-10-CM | POA: Diagnosis not present

## 2022-11-04 DIAGNOSIS — N183 Chronic kidney disease, stage 3 unspecified: Secondary | ICD-10-CM | POA: Diagnosis not present

## 2022-11-04 DIAGNOSIS — Z471 Aftercare following joint replacement surgery: Secondary | ICD-10-CM | POA: Diagnosis not present

## 2022-11-04 DIAGNOSIS — Z96641 Presence of right artificial hip joint: Secondary | ICD-10-CM | POA: Diagnosis not present

## 2022-11-09 DIAGNOSIS — E1122 Type 2 diabetes mellitus with diabetic chronic kidney disease: Secondary | ICD-10-CM | POA: Diagnosis not present

## 2022-11-09 DIAGNOSIS — E7800 Pure hypercholesterolemia, unspecified: Secondary | ICD-10-CM | POA: Diagnosis not present

## 2022-11-09 DIAGNOSIS — M4802 Spinal stenosis, cervical region: Secondary | ICD-10-CM | POA: Diagnosis not present

## 2022-11-09 DIAGNOSIS — E1136 Type 2 diabetes mellitus with diabetic cataract: Secondary | ICD-10-CM | POA: Diagnosis not present

## 2022-11-09 DIAGNOSIS — I1310 Hypertensive heart and chronic kidney disease without heart failure, with stage 1 through stage 4 chronic kidney disease, or unspecified chronic kidney disease: Secondary | ICD-10-CM | POA: Diagnosis not present

## 2022-11-09 DIAGNOSIS — Z471 Aftercare following joint replacement surgery: Secondary | ICD-10-CM | POA: Diagnosis not present

## 2022-11-09 DIAGNOSIS — Z96641 Presence of right artificial hip joint: Secondary | ICD-10-CM | POA: Diagnosis not present

## 2022-11-09 DIAGNOSIS — N183 Chronic kidney disease, stage 3 unspecified: Secondary | ICD-10-CM | POA: Diagnosis not present

## 2022-11-09 DIAGNOSIS — I700 Atherosclerosis of aorta: Secondary | ICD-10-CM | POA: Diagnosis not present

## 2022-11-10 DIAGNOSIS — I700 Atherosclerosis of aorta: Secondary | ICD-10-CM | POA: Diagnosis not present

## 2022-11-10 DIAGNOSIS — M4802 Spinal stenosis, cervical region: Secondary | ICD-10-CM | POA: Diagnosis not present

## 2022-11-10 DIAGNOSIS — E1136 Type 2 diabetes mellitus with diabetic cataract: Secondary | ICD-10-CM | POA: Diagnosis not present

## 2022-11-10 DIAGNOSIS — E1122 Type 2 diabetes mellitus with diabetic chronic kidney disease: Secondary | ICD-10-CM | POA: Diagnosis not present

## 2022-11-10 DIAGNOSIS — N183 Chronic kidney disease, stage 3 unspecified: Secondary | ICD-10-CM | POA: Diagnosis not present

## 2022-11-10 DIAGNOSIS — Z96641 Presence of right artificial hip joint: Secondary | ICD-10-CM | POA: Diagnosis not present

## 2022-11-10 DIAGNOSIS — Z471 Aftercare following joint replacement surgery: Secondary | ICD-10-CM | POA: Diagnosis not present

## 2022-11-10 DIAGNOSIS — I1310 Hypertensive heart and chronic kidney disease without heart failure, with stage 1 through stage 4 chronic kidney disease, or unspecified chronic kidney disease: Secondary | ICD-10-CM | POA: Diagnosis not present

## 2022-11-10 DIAGNOSIS — E7800 Pure hypercholesterolemia, unspecified: Secondary | ICD-10-CM | POA: Diagnosis not present

## 2022-11-12 DIAGNOSIS — Z471 Aftercare following joint replacement surgery: Secondary | ICD-10-CM | POA: Diagnosis not present

## 2022-11-12 DIAGNOSIS — E7800 Pure hypercholesterolemia, unspecified: Secondary | ICD-10-CM | POA: Diagnosis not present

## 2022-11-12 DIAGNOSIS — Z96641 Presence of right artificial hip joint: Secondary | ICD-10-CM | POA: Diagnosis not present

## 2022-11-12 DIAGNOSIS — I1310 Hypertensive heart and chronic kidney disease without heart failure, with stage 1 through stage 4 chronic kidney disease, or unspecified chronic kidney disease: Secondary | ICD-10-CM | POA: Diagnosis not present

## 2022-11-12 DIAGNOSIS — M4802 Spinal stenosis, cervical region: Secondary | ICD-10-CM | POA: Diagnosis not present

## 2022-11-12 DIAGNOSIS — N183 Chronic kidney disease, stage 3 unspecified: Secondary | ICD-10-CM | POA: Diagnosis not present

## 2022-11-12 DIAGNOSIS — I700 Atherosclerosis of aorta: Secondary | ICD-10-CM | POA: Diagnosis not present

## 2022-11-12 DIAGNOSIS — E1122 Type 2 diabetes mellitus with diabetic chronic kidney disease: Secondary | ICD-10-CM | POA: Diagnosis not present

## 2022-11-12 DIAGNOSIS — E1136 Type 2 diabetes mellitus with diabetic cataract: Secondary | ICD-10-CM | POA: Diagnosis not present

## 2022-11-16 DIAGNOSIS — Z96641 Presence of right artificial hip joint: Secondary | ICD-10-CM | POA: Diagnosis not present

## 2022-11-16 DIAGNOSIS — E7800 Pure hypercholesterolemia, unspecified: Secondary | ICD-10-CM | POA: Diagnosis not present

## 2022-11-16 DIAGNOSIS — I1310 Hypertensive heart and chronic kidney disease without heart failure, with stage 1 through stage 4 chronic kidney disease, or unspecified chronic kidney disease: Secondary | ICD-10-CM | POA: Diagnosis not present

## 2022-11-16 DIAGNOSIS — E1136 Type 2 diabetes mellitus with diabetic cataract: Secondary | ICD-10-CM | POA: Diagnosis not present

## 2022-11-16 DIAGNOSIS — I700 Atherosclerosis of aorta: Secondary | ICD-10-CM | POA: Diagnosis not present

## 2022-11-16 DIAGNOSIS — M4802 Spinal stenosis, cervical region: Secondary | ICD-10-CM | POA: Diagnosis not present

## 2022-11-16 DIAGNOSIS — Z471 Aftercare following joint replacement surgery: Secondary | ICD-10-CM | POA: Diagnosis not present

## 2022-11-16 DIAGNOSIS — N183 Chronic kidney disease, stage 3 unspecified: Secondary | ICD-10-CM | POA: Diagnosis not present

## 2022-11-16 DIAGNOSIS — E1122 Type 2 diabetes mellitus with diabetic chronic kidney disease: Secondary | ICD-10-CM | POA: Diagnosis not present

## 2022-11-19 DIAGNOSIS — I700 Atherosclerosis of aorta: Secondary | ICD-10-CM | POA: Diagnosis not present

## 2022-11-19 DIAGNOSIS — Z96641 Presence of right artificial hip joint: Secondary | ICD-10-CM | POA: Diagnosis not present

## 2022-11-19 DIAGNOSIS — Z471 Aftercare following joint replacement surgery: Secondary | ICD-10-CM | POA: Diagnosis not present

## 2022-11-19 DIAGNOSIS — N183 Chronic kidney disease, stage 3 unspecified: Secondary | ICD-10-CM | POA: Diagnosis not present

## 2022-11-19 DIAGNOSIS — M4802 Spinal stenosis, cervical region: Secondary | ICD-10-CM | POA: Diagnosis not present

## 2022-11-19 DIAGNOSIS — E7800 Pure hypercholesterolemia, unspecified: Secondary | ICD-10-CM | POA: Diagnosis not present

## 2022-11-19 DIAGNOSIS — E1122 Type 2 diabetes mellitus with diabetic chronic kidney disease: Secondary | ICD-10-CM | POA: Diagnosis not present

## 2022-11-19 DIAGNOSIS — E1136 Type 2 diabetes mellitus with diabetic cataract: Secondary | ICD-10-CM | POA: Diagnosis not present

## 2022-11-19 DIAGNOSIS — I1310 Hypertensive heart and chronic kidney disease without heart failure, with stage 1 through stage 4 chronic kidney disease, or unspecified chronic kidney disease: Secondary | ICD-10-CM | POA: Diagnosis not present

## 2022-11-23 DIAGNOSIS — N183 Chronic kidney disease, stage 3 unspecified: Secondary | ICD-10-CM | POA: Diagnosis not present

## 2022-11-23 DIAGNOSIS — Z96641 Presence of right artificial hip joint: Secondary | ICD-10-CM | POA: Diagnosis not present

## 2022-11-23 DIAGNOSIS — I1310 Hypertensive heart and chronic kidney disease without heart failure, with stage 1 through stage 4 chronic kidney disease, or unspecified chronic kidney disease: Secondary | ICD-10-CM | POA: Diagnosis not present

## 2022-11-23 DIAGNOSIS — Z471 Aftercare following joint replacement surgery: Secondary | ICD-10-CM | POA: Diagnosis not present

## 2022-11-23 DIAGNOSIS — M4802 Spinal stenosis, cervical region: Secondary | ICD-10-CM | POA: Diagnosis not present

## 2022-11-23 DIAGNOSIS — I700 Atherosclerosis of aorta: Secondary | ICD-10-CM | POA: Diagnosis not present

## 2022-11-23 DIAGNOSIS — E1136 Type 2 diabetes mellitus with diabetic cataract: Secondary | ICD-10-CM | POA: Diagnosis not present

## 2022-11-23 DIAGNOSIS — E7800 Pure hypercholesterolemia, unspecified: Secondary | ICD-10-CM | POA: Diagnosis not present

## 2022-11-23 DIAGNOSIS — E1122 Type 2 diabetes mellitus with diabetic chronic kidney disease: Secondary | ICD-10-CM | POA: Diagnosis not present

## 2022-11-26 DIAGNOSIS — I1310 Hypertensive heart and chronic kidney disease without heart failure, with stage 1 through stage 4 chronic kidney disease, or unspecified chronic kidney disease: Secondary | ICD-10-CM | POA: Diagnosis not present

## 2022-11-26 DIAGNOSIS — E1136 Type 2 diabetes mellitus with diabetic cataract: Secondary | ICD-10-CM | POA: Diagnosis not present

## 2022-11-26 DIAGNOSIS — M4802 Spinal stenosis, cervical region: Secondary | ICD-10-CM | POA: Diagnosis not present

## 2022-11-26 DIAGNOSIS — I700 Atherosclerosis of aorta: Secondary | ICD-10-CM | POA: Diagnosis not present

## 2022-11-26 DIAGNOSIS — E1122 Type 2 diabetes mellitus with diabetic chronic kidney disease: Secondary | ICD-10-CM | POA: Diagnosis not present

## 2022-11-26 DIAGNOSIS — Z471 Aftercare following joint replacement surgery: Secondary | ICD-10-CM | POA: Diagnosis not present

## 2022-11-26 DIAGNOSIS — Z96641 Presence of right artificial hip joint: Secondary | ICD-10-CM | POA: Diagnosis not present

## 2022-11-26 DIAGNOSIS — N183 Chronic kidney disease, stage 3 unspecified: Secondary | ICD-10-CM | POA: Diagnosis not present

## 2022-11-26 DIAGNOSIS — E7800 Pure hypercholesterolemia, unspecified: Secondary | ICD-10-CM | POA: Diagnosis not present

## 2022-11-29 DIAGNOSIS — Z96641 Presence of right artificial hip joint: Secondary | ICD-10-CM | POA: Diagnosis not present

## 2022-11-29 DIAGNOSIS — I700 Atherosclerosis of aorta: Secondary | ICD-10-CM | POA: Diagnosis not present

## 2022-11-29 DIAGNOSIS — I1310 Hypertensive heart and chronic kidney disease without heart failure, with stage 1 through stage 4 chronic kidney disease, or unspecified chronic kidney disease: Secondary | ICD-10-CM | POA: Diagnosis not present

## 2022-11-29 DIAGNOSIS — E7800 Pure hypercholesterolemia, unspecified: Secondary | ICD-10-CM | POA: Diagnosis not present

## 2022-11-29 DIAGNOSIS — M4802 Spinal stenosis, cervical region: Secondary | ICD-10-CM | POA: Diagnosis not present

## 2022-11-29 DIAGNOSIS — Z471 Aftercare following joint replacement surgery: Secondary | ICD-10-CM | POA: Diagnosis not present

## 2022-11-29 DIAGNOSIS — N183 Chronic kidney disease, stage 3 unspecified: Secondary | ICD-10-CM | POA: Diagnosis not present

## 2022-11-29 DIAGNOSIS — E1122 Type 2 diabetes mellitus with diabetic chronic kidney disease: Secondary | ICD-10-CM | POA: Diagnosis not present

## 2022-11-29 DIAGNOSIS — E1136 Type 2 diabetes mellitus with diabetic cataract: Secondary | ICD-10-CM | POA: Diagnosis not present

## 2022-12-01 DIAGNOSIS — I1310 Hypertensive heart and chronic kidney disease without heart failure, with stage 1 through stage 4 chronic kidney disease, or unspecified chronic kidney disease: Secondary | ICD-10-CM | POA: Diagnosis not present

## 2022-12-01 DIAGNOSIS — E1122 Type 2 diabetes mellitus with diabetic chronic kidney disease: Secondary | ICD-10-CM | POA: Diagnosis not present

## 2022-12-01 DIAGNOSIS — E1136 Type 2 diabetes mellitus with diabetic cataract: Secondary | ICD-10-CM | POA: Diagnosis not present

## 2022-12-01 DIAGNOSIS — Z96641 Presence of right artificial hip joint: Secondary | ICD-10-CM | POA: Diagnosis not present

## 2022-12-01 DIAGNOSIS — M4802 Spinal stenosis, cervical region: Secondary | ICD-10-CM | POA: Diagnosis not present

## 2022-12-01 DIAGNOSIS — E7800 Pure hypercholesterolemia, unspecified: Secondary | ICD-10-CM | POA: Diagnosis not present

## 2022-12-01 DIAGNOSIS — N183 Chronic kidney disease, stage 3 unspecified: Secondary | ICD-10-CM | POA: Diagnosis not present

## 2022-12-01 DIAGNOSIS — Z471 Aftercare following joint replacement surgery: Secondary | ICD-10-CM | POA: Diagnosis not present

## 2022-12-01 DIAGNOSIS — I700 Atherosclerosis of aorta: Secondary | ICD-10-CM | POA: Diagnosis not present

## 2022-12-03 DIAGNOSIS — I1310 Hypertensive heart and chronic kidney disease without heart failure, with stage 1 through stage 4 chronic kidney disease, or unspecified chronic kidney disease: Secondary | ICD-10-CM | POA: Diagnosis not present

## 2022-12-03 DIAGNOSIS — N183 Chronic kidney disease, stage 3 unspecified: Secondary | ICD-10-CM | POA: Diagnosis not present

## 2022-12-03 DIAGNOSIS — E7800 Pure hypercholesterolemia, unspecified: Secondary | ICD-10-CM | POA: Diagnosis not present

## 2022-12-03 DIAGNOSIS — E1165 Type 2 diabetes mellitus with hyperglycemia: Secondary | ICD-10-CM | POA: Diagnosis not present

## 2022-12-03 DIAGNOSIS — E1122 Type 2 diabetes mellitus with diabetic chronic kidney disease: Secondary | ICD-10-CM | POA: Diagnosis not present

## 2022-12-03 DIAGNOSIS — M4802 Spinal stenosis, cervical region: Secondary | ICD-10-CM | POA: Diagnosis not present

## 2022-12-03 DIAGNOSIS — Z96641 Presence of right artificial hip joint: Secondary | ICD-10-CM | POA: Diagnosis not present

## 2022-12-03 DIAGNOSIS — E1136 Type 2 diabetes mellitus with diabetic cataract: Secondary | ICD-10-CM | POA: Diagnosis not present

## 2022-12-03 DIAGNOSIS — Z471 Aftercare following joint replacement surgery: Secondary | ICD-10-CM | POA: Diagnosis not present

## 2022-12-03 DIAGNOSIS — I700 Atherosclerosis of aorta: Secondary | ICD-10-CM | POA: Diagnosis not present

## 2022-12-08 DIAGNOSIS — M4802 Spinal stenosis, cervical region: Secondary | ICD-10-CM | POA: Diagnosis not present

## 2022-12-08 DIAGNOSIS — N183 Chronic kidney disease, stage 3 unspecified: Secondary | ICD-10-CM | POA: Diagnosis not present

## 2022-12-08 DIAGNOSIS — Z471 Aftercare following joint replacement surgery: Secondary | ICD-10-CM | POA: Diagnosis not present

## 2022-12-08 DIAGNOSIS — Z96641 Presence of right artificial hip joint: Secondary | ICD-10-CM | POA: Diagnosis not present

## 2022-12-08 DIAGNOSIS — E1136 Type 2 diabetes mellitus with diabetic cataract: Secondary | ICD-10-CM | POA: Diagnosis not present

## 2022-12-08 DIAGNOSIS — I700 Atherosclerosis of aorta: Secondary | ICD-10-CM | POA: Diagnosis not present

## 2022-12-08 DIAGNOSIS — E1122 Type 2 diabetes mellitus with diabetic chronic kidney disease: Secondary | ICD-10-CM | POA: Diagnosis not present

## 2022-12-08 DIAGNOSIS — E7800 Pure hypercholesterolemia, unspecified: Secondary | ICD-10-CM | POA: Diagnosis not present

## 2022-12-08 DIAGNOSIS — I1310 Hypertensive heart and chronic kidney disease without heart failure, with stage 1 through stage 4 chronic kidney disease, or unspecified chronic kidney disease: Secondary | ICD-10-CM | POA: Diagnosis not present

## 2022-12-09 DIAGNOSIS — N183 Chronic kidney disease, stage 3 unspecified: Secondary | ICD-10-CM | POA: Diagnosis not present

## 2022-12-09 DIAGNOSIS — E1122 Type 2 diabetes mellitus with diabetic chronic kidney disease: Secondary | ICD-10-CM | POA: Diagnosis not present

## 2022-12-09 DIAGNOSIS — Z96641 Presence of right artificial hip joint: Secondary | ICD-10-CM | POA: Diagnosis not present

## 2022-12-09 DIAGNOSIS — I700 Atherosclerosis of aorta: Secondary | ICD-10-CM | POA: Diagnosis not present

## 2022-12-09 DIAGNOSIS — E1136 Type 2 diabetes mellitus with diabetic cataract: Secondary | ICD-10-CM | POA: Diagnosis not present

## 2022-12-09 DIAGNOSIS — E7800 Pure hypercholesterolemia, unspecified: Secondary | ICD-10-CM | POA: Diagnosis not present

## 2022-12-09 DIAGNOSIS — M4802 Spinal stenosis, cervical region: Secondary | ICD-10-CM | POA: Diagnosis not present

## 2022-12-09 DIAGNOSIS — Z471 Aftercare following joint replacement surgery: Secondary | ICD-10-CM | POA: Diagnosis not present

## 2022-12-09 DIAGNOSIS — I1310 Hypertensive heart and chronic kidney disease without heart failure, with stage 1 through stage 4 chronic kidney disease, or unspecified chronic kidney disease: Secondary | ICD-10-CM | POA: Diagnosis not present

## 2022-12-14 DIAGNOSIS — M167 Other unilateral secondary osteoarthritis of hip: Secondary | ICD-10-CM | POA: Diagnosis not present

## 2022-12-14 DIAGNOSIS — Z96641 Presence of right artificial hip joint: Secondary | ICD-10-CM | POA: Diagnosis not present

## 2022-12-15 DIAGNOSIS — Z96641 Presence of right artificial hip joint: Secondary | ICD-10-CM | POA: Diagnosis not present

## 2022-12-15 DIAGNOSIS — I700 Atherosclerosis of aorta: Secondary | ICD-10-CM | POA: Diagnosis not present

## 2022-12-15 DIAGNOSIS — M4802 Spinal stenosis, cervical region: Secondary | ICD-10-CM | POA: Diagnosis not present

## 2022-12-15 DIAGNOSIS — I1310 Hypertensive heart and chronic kidney disease without heart failure, with stage 1 through stage 4 chronic kidney disease, or unspecified chronic kidney disease: Secondary | ICD-10-CM | POA: Diagnosis not present

## 2022-12-15 DIAGNOSIS — E7800 Pure hypercholesterolemia, unspecified: Secondary | ICD-10-CM | POA: Diagnosis not present

## 2022-12-15 DIAGNOSIS — N183 Chronic kidney disease, stage 3 unspecified: Secondary | ICD-10-CM | POA: Diagnosis not present

## 2022-12-15 DIAGNOSIS — Z471 Aftercare following joint replacement surgery: Secondary | ICD-10-CM | POA: Diagnosis not present

## 2022-12-15 DIAGNOSIS — E1122 Type 2 diabetes mellitus with diabetic chronic kidney disease: Secondary | ICD-10-CM | POA: Diagnosis not present

## 2022-12-15 DIAGNOSIS — E1136 Type 2 diabetes mellitus with diabetic cataract: Secondary | ICD-10-CM | POA: Diagnosis not present

## 2022-12-20 DIAGNOSIS — I152 Hypertension secondary to endocrine disorders: Secondary | ICD-10-CM | POA: Diagnosis not present

## 2022-12-20 DIAGNOSIS — E785 Hyperlipidemia, unspecified: Secondary | ICD-10-CM | POA: Diagnosis not present

## 2022-12-20 DIAGNOSIS — E1159 Type 2 diabetes mellitus with other circulatory complications: Secondary | ICD-10-CM | POA: Diagnosis not present

## 2022-12-20 DIAGNOSIS — Z794 Long term (current) use of insulin: Secondary | ICD-10-CM | POA: Diagnosis not present

## 2022-12-20 DIAGNOSIS — E1169 Type 2 diabetes mellitus with other specified complication: Secondary | ICD-10-CM | POA: Diagnosis not present

## 2023-01-03 DIAGNOSIS — E1169 Type 2 diabetes mellitus with other specified complication: Secondary | ICD-10-CM | POA: Diagnosis not present

## 2023-01-03 DIAGNOSIS — I10 Essential (primary) hypertension: Secondary | ICD-10-CM | POA: Diagnosis not present

## 2023-01-03 DIAGNOSIS — N182 Chronic kidney disease, stage 2 (mild): Secondary | ICD-10-CM | POA: Diagnosis not present

## 2023-01-03 DIAGNOSIS — E785 Hyperlipidemia, unspecified: Secondary | ICD-10-CM | POA: Diagnosis not present

## 2023-01-03 DIAGNOSIS — E1122 Type 2 diabetes mellitus with diabetic chronic kidney disease: Secondary | ICD-10-CM | POA: Diagnosis not present

## 2023-01-03 DIAGNOSIS — Z794 Long term (current) use of insulin: Secondary | ICD-10-CM | POA: Diagnosis not present

## 2023-01-03 DIAGNOSIS — I779 Disorder of arteries and arterioles, unspecified: Secondary | ICD-10-CM | POA: Diagnosis not present

## 2023-01-05 ENCOUNTER — Other Ambulatory Visit: Payer: Self-pay | Admitting: Internal Medicine

## 2023-01-05 DIAGNOSIS — Z1231 Encounter for screening mammogram for malignant neoplasm of breast: Secondary | ICD-10-CM

## 2023-01-10 DIAGNOSIS — Z8673 Personal history of transient ischemic attack (TIA), and cerebral infarction without residual deficits: Secondary | ICD-10-CM | POA: Diagnosis not present

## 2023-01-10 DIAGNOSIS — E1169 Type 2 diabetes mellitus with other specified complication: Secondary | ICD-10-CM | POA: Diagnosis not present

## 2023-01-10 DIAGNOSIS — I779 Disorder of arteries and arterioles, unspecified: Secondary | ICD-10-CM | POA: Diagnosis not present

## 2023-01-10 DIAGNOSIS — I10 Essential (primary) hypertension: Secondary | ICD-10-CM | POA: Diagnosis not present

## 2023-01-10 DIAGNOSIS — I129 Hypertensive chronic kidney disease with stage 1 through stage 4 chronic kidney disease, or unspecified chronic kidney disease: Secondary | ICD-10-CM | POA: Diagnosis not present

## 2023-01-10 DIAGNOSIS — R2689 Other abnormalities of gait and mobility: Secondary | ICD-10-CM | POA: Diagnosis not present

## 2023-01-10 DIAGNOSIS — N182 Chronic kidney disease, stage 2 (mild): Secondary | ICD-10-CM | POA: Diagnosis not present

## 2023-01-10 DIAGNOSIS — N1831 Chronic kidney disease, stage 3a: Secondary | ICD-10-CM | POA: Diagnosis not present

## 2023-01-10 DIAGNOSIS — Z95818 Presence of other cardiac implants and grafts: Secondary | ICD-10-CM | POA: Diagnosis not present

## 2023-01-10 DIAGNOSIS — E1122 Type 2 diabetes mellitus with diabetic chronic kidney disease: Secondary | ICD-10-CM | POA: Diagnosis not present

## 2023-01-10 DIAGNOSIS — M542 Cervicalgia: Secondary | ICD-10-CM | POA: Diagnosis not present

## 2023-01-10 DIAGNOSIS — Z794 Long term (current) use of insulin: Secondary | ICD-10-CM | POA: Diagnosis not present

## 2023-01-10 DIAGNOSIS — E785 Hyperlipidemia, unspecified: Secondary | ICD-10-CM | POA: Diagnosis not present

## 2023-01-13 DIAGNOSIS — H25813 Combined forms of age-related cataract, bilateral: Secondary | ICD-10-CM | POA: Diagnosis not present

## 2023-01-13 DIAGNOSIS — H40033 Anatomical narrow angle, bilateral: Secondary | ICD-10-CM | POA: Diagnosis not present

## 2023-01-13 DIAGNOSIS — I10 Essential (primary) hypertension: Secondary | ICD-10-CM | POA: Diagnosis not present

## 2023-01-13 DIAGNOSIS — H35033 Hypertensive retinopathy, bilateral: Secondary | ICD-10-CM | POA: Diagnosis not present

## 2023-01-13 DIAGNOSIS — E119 Type 2 diabetes mellitus without complications: Secondary | ICD-10-CM | POA: Diagnosis not present

## 2023-01-13 DIAGNOSIS — H524 Presbyopia: Secondary | ICD-10-CM | POA: Diagnosis not present

## 2023-01-18 ENCOUNTER — Ambulatory Visit
Admission: RE | Admit: 2023-01-18 | Discharge: 2023-01-18 | Disposition: A | Discharge status: Home / Self Care | Payer: Medicare HMO | Source: Ambulatory Visit | Attending: Internal Medicine | Admitting: Internal Medicine

## 2023-01-18 DIAGNOSIS — Z1231 Encounter for screening mammogram for malignant neoplasm of breast: Secondary | ICD-10-CM | POA: Insufficient documentation

## 2023-01-19 DIAGNOSIS — Z0100 Encounter for examination of eyes and vision without abnormal findings: Secondary | ICD-10-CM | POA: Diagnosis not present

## 2023-02-22 DIAGNOSIS — D225 Melanocytic nevi of trunk: Secondary | ICD-10-CM | POA: Diagnosis not present

## 2023-02-22 DIAGNOSIS — D2272 Melanocytic nevi of left lower limb, including hip: Secondary | ICD-10-CM | POA: Diagnosis not present

## 2023-02-22 DIAGNOSIS — D2261 Melanocytic nevi of right upper limb, including shoulder: Secondary | ICD-10-CM | POA: Diagnosis not present

## 2023-02-22 DIAGNOSIS — L400 Psoriasis vulgaris: Secondary | ICD-10-CM | POA: Diagnosis not present

## 2023-02-22 DIAGNOSIS — D2271 Melanocytic nevi of right lower limb, including hip: Secondary | ICD-10-CM | POA: Diagnosis not present

## 2023-02-22 DIAGNOSIS — D2262 Melanocytic nevi of left upper limb, including shoulder: Secondary | ICD-10-CM | POA: Diagnosis not present

## 2023-02-22 DIAGNOSIS — Z08 Encounter for follow-up examination after completed treatment for malignant neoplasm: Secondary | ICD-10-CM | POA: Diagnosis not present

## 2023-02-22 DIAGNOSIS — Z85828 Personal history of other malignant neoplasm of skin: Secondary | ICD-10-CM | POA: Diagnosis not present

## 2023-03-03 DIAGNOSIS — E1165 Type 2 diabetes mellitus with hyperglycemia: Secondary | ICD-10-CM | POA: Diagnosis not present

## 2023-04-10 DIAGNOSIS — N182 Chronic kidney disease, stage 2 (mild): Secondary | ICD-10-CM | POA: Diagnosis not present

## 2023-04-10 DIAGNOSIS — Z03818 Encounter for observation for suspected exposure to other biological agents ruled out: Secondary | ICD-10-CM | POA: Diagnosis not present

## 2023-04-10 DIAGNOSIS — E1122 Type 2 diabetes mellitus with diabetic chronic kidney disease: Secondary | ICD-10-CM | POA: Diagnosis not present

## 2023-04-10 DIAGNOSIS — I10 Essential (primary) hypertension: Secondary | ICD-10-CM | POA: Diagnosis not present

## 2023-04-10 DIAGNOSIS — U071 COVID-19: Secondary | ICD-10-CM | POA: Diagnosis not present

## 2023-04-20 DIAGNOSIS — N1831 Chronic kidney disease, stage 3a: Secondary | ICD-10-CM | POA: Diagnosis not present

## 2023-04-20 DIAGNOSIS — I10 Essential (primary) hypertension: Secondary | ICD-10-CM | POA: Diagnosis not present

## 2023-04-20 DIAGNOSIS — N182 Chronic kidney disease, stage 2 (mild): Secondary | ICD-10-CM | POA: Diagnosis not present

## 2023-04-20 DIAGNOSIS — E1122 Type 2 diabetes mellitus with diabetic chronic kidney disease: Secondary | ICD-10-CM | POA: Diagnosis not present

## 2023-04-20 DIAGNOSIS — E785 Hyperlipidemia, unspecified: Secondary | ICD-10-CM | POA: Diagnosis not present

## 2023-04-20 DIAGNOSIS — E1169 Type 2 diabetes mellitus with other specified complication: Secondary | ICD-10-CM | POA: Diagnosis not present

## 2023-04-27 DIAGNOSIS — E1169 Type 2 diabetes mellitus with other specified complication: Secondary | ICD-10-CM | POA: Diagnosis not present

## 2023-04-27 DIAGNOSIS — Z794 Long term (current) use of insulin: Secondary | ICD-10-CM | POA: Diagnosis not present

## 2023-04-27 DIAGNOSIS — I152 Hypertension secondary to endocrine disorders: Secondary | ICD-10-CM | POA: Diagnosis not present

## 2023-04-27 DIAGNOSIS — Z8673 Personal history of transient ischemic attack (TIA), and cerebral infarction without residual deficits: Secondary | ICD-10-CM | POA: Diagnosis not present

## 2023-04-27 DIAGNOSIS — E1159 Type 2 diabetes mellitus with other circulatory complications: Secondary | ICD-10-CM | POA: Diagnosis not present

## 2023-04-27 DIAGNOSIS — E785 Hyperlipidemia, unspecified: Secondary | ICD-10-CM | POA: Diagnosis not present

## 2023-06-01 DIAGNOSIS — E1165 Type 2 diabetes mellitus with hyperglycemia: Secondary | ICD-10-CM | POA: Diagnosis not present

## 2023-07-04 DIAGNOSIS — I10 Essential (primary) hypertension: Secondary | ICD-10-CM | POA: Diagnosis not present

## 2023-07-04 DIAGNOSIS — E785 Hyperlipidemia, unspecified: Secondary | ICD-10-CM | POA: Diagnosis not present

## 2023-07-04 DIAGNOSIS — Z8673 Personal history of transient ischemic attack (TIA), and cerebral infarction without residual deficits: Secondary | ICD-10-CM | POA: Diagnosis not present

## 2023-07-04 DIAGNOSIS — Z95818 Presence of other cardiac implants and grafts: Secondary | ICD-10-CM | POA: Diagnosis not present

## 2023-07-04 DIAGNOSIS — E1122 Type 2 diabetes mellitus with diabetic chronic kidney disease: Secondary | ICD-10-CM | POA: Diagnosis not present

## 2023-07-04 DIAGNOSIS — N182 Chronic kidney disease, stage 2 (mild): Secondary | ICD-10-CM | POA: Diagnosis not present

## 2023-07-04 DIAGNOSIS — E1169 Type 2 diabetes mellitus with other specified complication: Secondary | ICD-10-CM | POA: Diagnosis not present

## 2023-07-05 DIAGNOSIS — H25813 Combined forms of age-related cataract, bilateral: Secondary | ICD-10-CM | POA: Diagnosis not present

## 2023-07-05 DIAGNOSIS — H40033 Anatomical narrow angle, bilateral: Secondary | ICD-10-CM | POA: Diagnosis not present

## 2023-07-07 DIAGNOSIS — Z794 Long term (current) use of insulin: Secondary | ICD-10-CM | POA: Diagnosis not present

## 2023-07-07 DIAGNOSIS — N182 Chronic kidney disease, stage 2 (mild): Secondary | ICD-10-CM | POA: Diagnosis not present

## 2023-07-07 DIAGNOSIS — I10 Essential (primary) hypertension: Secondary | ICD-10-CM | POA: Diagnosis not present

## 2023-07-07 DIAGNOSIS — E785 Hyperlipidemia, unspecified: Secondary | ICD-10-CM | POA: Diagnosis not present

## 2023-07-07 DIAGNOSIS — E1169 Type 2 diabetes mellitus with other specified complication: Secondary | ICD-10-CM | POA: Diagnosis not present

## 2023-07-07 DIAGNOSIS — E1122 Type 2 diabetes mellitus with diabetic chronic kidney disease: Secondary | ICD-10-CM | POA: Diagnosis not present

## 2023-07-12 DIAGNOSIS — H2513 Age-related nuclear cataract, bilateral: Secondary | ICD-10-CM | POA: Diagnosis not present

## 2023-07-12 DIAGNOSIS — H04123 Dry eye syndrome of bilateral lacrimal glands: Secondary | ICD-10-CM | POA: Diagnosis not present

## 2023-07-12 DIAGNOSIS — H25013 Cortical age-related cataract, bilateral: Secondary | ICD-10-CM | POA: Diagnosis not present

## 2023-07-12 DIAGNOSIS — E119 Type 2 diabetes mellitus without complications: Secondary | ICD-10-CM | POA: Diagnosis not present

## 2023-07-14 DIAGNOSIS — E1169 Type 2 diabetes mellitus with other specified complication: Secondary | ICD-10-CM | POA: Diagnosis not present

## 2023-07-14 DIAGNOSIS — I129 Hypertensive chronic kidney disease with stage 1 through stage 4 chronic kidney disease, or unspecified chronic kidney disease: Secondary | ICD-10-CM | POA: Diagnosis not present

## 2023-07-14 DIAGNOSIS — N182 Chronic kidney disease, stage 2 (mild): Secondary | ICD-10-CM | POA: Diagnosis not present

## 2023-07-14 DIAGNOSIS — E785 Hyperlipidemia, unspecified: Secondary | ICD-10-CM | POA: Diagnosis not present

## 2023-07-14 DIAGNOSIS — Z1331 Encounter for screening for depression: Secondary | ICD-10-CM | POA: Diagnosis not present

## 2023-07-14 DIAGNOSIS — E1122 Type 2 diabetes mellitus with diabetic chronic kidney disease: Secondary | ICD-10-CM | POA: Diagnosis not present

## 2023-07-14 DIAGNOSIS — Z0000 Encounter for general adult medical examination without abnormal findings: Secondary | ICD-10-CM | POA: Diagnosis not present

## 2023-07-14 DIAGNOSIS — Z1211 Encounter for screening for malignant neoplasm of colon: Secondary | ICD-10-CM | POA: Diagnosis not present

## 2023-07-26 DIAGNOSIS — H25012 Cortical age-related cataract, left eye: Secondary | ICD-10-CM | POA: Diagnosis not present

## 2023-07-26 DIAGNOSIS — H25011 Cortical age-related cataract, right eye: Secondary | ICD-10-CM | POA: Diagnosis not present

## 2023-07-26 DIAGNOSIS — H2513 Age-related nuclear cataract, bilateral: Secondary | ICD-10-CM | POA: Diagnosis not present

## 2023-07-26 DIAGNOSIS — H25013 Cortical age-related cataract, bilateral: Secondary | ICD-10-CM | POA: Diagnosis not present

## 2023-07-27 ENCOUNTER — Encounter: Payer: Self-pay | Admitting: Ophthalmology

## 2023-07-28 NOTE — Anesthesia Preprocedure Evaluation (Addendum)
 Anesthesia Evaluation  Patient identified by MRN, date of birth, ID band Patient awake    Reviewed: Allergy & Precautions, H&P , NPO status , Patient's Chart, lab work & pertinent test results  Airway Mallampati: III  TM Distance: <3 FB Neck ROM: Full    Dental no notable dental hx. (+) Upper Dentures   Pulmonary neg pulmonary ROS, sleep apnea , pneumonia   Pulmonary exam normal breath sounds clear to auscultation       Cardiovascular hypertension, negative cardio ROS Normal cardiovascular exam Rhythm:Regular Rate:Normal  01-25-21 echo  1. Left ventricular ejection fraction, by estimation, is 60 to 65%. The  left ventricle has normal function. The left ventricle has no regional  wall motion abnormalities. There is mild left ventricular hypertrophy.  Left ventricular diastolic parameters  are consistent with Grade I diastolic dysfunction (impaired relaxation).   2. Right ventricular systolic function is normal. The right ventricular  size is normal. There is normal pulmonary artery systolic pressure. The  estimated right ventricular systolic pressure is 31.8 mmHg.   3. Left atrial size was mildly dilated.   4. The inferior vena cava is normal in size with greater than 50%  respiratory variability, suggesting right atrial pressure of 3 mmHg.     Neuro/Psych CVA negative neurological ROS  negative psych ROS   GI/Hepatic negative GI ROS, Neg liver ROS,,,  Endo/Other  negative endocrine ROSdiabetes    Renal/GU Renal diseasenegative Renal ROS  negative genitourinary   Musculoskeletal negative musculoskeletal ROS (+) Arthritis ,    Abdominal   Peds negative pediatric ROS (+)  Hematology negative hematology ROS (+)   Anesthesia Other Findings   CKD (chronic kidney disease), stage III (HCC) T2DM (type 2 diabetes mellitus) Hypertension  Osteopenia High cholesterol  Skin cancer Arthritis of both hips  Ischemic stroke  (HCC) Pneumonia O SA (obstructive sleep apnea) OAB (overactive bladder)  Tinnitus Implantable loop recorder present Bilateral cataracts Hyperplastic colon polyp Adenomatous polyp Vertigo  Bilateral carotid artery disease  Diastolic dysfunction  Vertebral artery stenosis, bilateral Aortic atherosclerosis (HCC)  Cervical spinal stenosis Arthritis      Reproductive/Obstetrics negative OB ROS                             Anesthesia Physical Anesthesia Plan  ASA: 3  Anesthesia Plan: MAC   Post-op Pain Management:    Induction: Intravenous  PONV Risk Score and Plan:   Airway Management Planned: Natural Airway and Nasal Cannula  Additional Equipment:   Intra-op Plan:   Post-operative Plan:   Informed Consent: I have reviewed the patients History and Physical, chart, labs and discussed the procedure including the risks, benefits and alternatives for the proposed anesthesia with the patient or authorized representative who has indicated his/her understanding and acceptance.     Dental Advisory Given  Plan Discussed with: Anesthesiologist, CRNA and Surgeon  Anesthesia Plan Comments: (Patient consented for risks of anesthesia including but not limited to:  - adverse reactions to medications - damage to eyes, teeth, lips or other oral mucosa - nerve damage due to positioning  - sore throat or hoarseness - Damage to heart, brain, nerves, lungs, other parts of body or loss of life  Patient voiced understanding and assent.)       Anesthesia Quick Evaluation

## 2023-08-02 NOTE — Discharge Instructions (Signed)

## 2023-08-03 DIAGNOSIS — H2511 Age-related nuclear cataract, right eye: Secondary | ICD-10-CM | POA: Diagnosis not present

## 2023-08-04 ENCOUNTER — Encounter: Payer: Self-pay | Admitting: Ophthalmology

## 2023-08-04 ENCOUNTER — Ambulatory Visit: Payer: Self-pay | Admitting: Anesthesiology

## 2023-08-04 ENCOUNTER — Ambulatory Visit
Admission: RE | Admit: 2023-08-04 | Discharge: 2023-08-04 | Disposition: A | Discharge status: Home / Self Care | Attending: Ophthalmology | Admitting: Ophthalmology

## 2023-08-04 ENCOUNTER — Other Ambulatory Visit: Payer: Self-pay

## 2023-08-04 ENCOUNTER — Encounter
Admission: RE | Disposition: A | Discharge status: Home / Self Care | Payer: Self-pay | Source: Home / Self Care | Attending: Ophthalmology

## 2023-08-04 DIAGNOSIS — I700 Atherosclerosis of aorta: Secondary | ICD-10-CM | POA: Insufficient documentation

## 2023-08-04 DIAGNOSIS — N183 Chronic kidney disease, stage 3 unspecified: Secondary | ICD-10-CM | POA: Diagnosis not present

## 2023-08-04 DIAGNOSIS — E1136 Type 2 diabetes mellitus with diabetic cataract: Secondary | ICD-10-CM | POA: Insufficient documentation

## 2023-08-04 DIAGNOSIS — I129 Hypertensive chronic kidney disease with stage 1 through stage 4 chronic kidney disease, or unspecified chronic kidney disease: Secondary | ICD-10-CM | POA: Insufficient documentation

## 2023-08-04 DIAGNOSIS — H25011 Cortical age-related cataract, right eye: Secondary | ICD-10-CM | POA: Diagnosis present

## 2023-08-04 DIAGNOSIS — I10 Essential (primary) hypertension: Secondary | ICD-10-CM | POA: Diagnosis not present

## 2023-08-04 DIAGNOSIS — E1122 Type 2 diabetes mellitus with diabetic chronic kidney disease: Secondary | ICD-10-CM | POA: Insufficient documentation

## 2023-08-04 DIAGNOSIS — Z7984 Long term (current) use of oral hypoglycemic drugs: Secondary | ICD-10-CM | POA: Insufficient documentation

## 2023-08-04 DIAGNOSIS — G4733 Obstructive sleep apnea (adult) (pediatric): Secondary | ICD-10-CM | POA: Diagnosis not present

## 2023-08-04 DIAGNOSIS — H2511 Age-related nuclear cataract, right eye: Secondary | ICD-10-CM | POA: Insufficient documentation

## 2023-08-04 HISTORY — PX: CATARACT EXTRACTION W/PHACO: SHX586

## 2023-08-04 LAB — GLUCOSE, CAPILLARY: Glucose-Capillary: 180 mg/dL — ABNORMAL HIGH (ref 70–99)

## 2023-08-04 SURGERY — PHACOEMULSIFICATION, CATARACT, WITH IOL INSERTION
Anesthesia: Monitor Anesthesia Care | Laterality: Right

## 2023-08-04 MED ORDER — ARMC OPHTHALMIC DILATING DROPS
1.0000 | OPHTHALMIC | Status: DC | PRN
  Administered 2023-08-04 (×3): 1 via OPHTHALMIC

## 2023-08-04 MED ORDER — MOXIFLOXACIN HCL 0.5 % OP SOLN
OPHTHALMIC | Status: DC | PRN
  Administered 2023-08-04: .2 mL via OPHTHALMIC

## 2023-08-04 MED ORDER — MIDAZOLAM HCL 2 MG/2ML IJ SOLN
INTRAMUSCULAR | Status: AC
  Filled 2023-08-04: qty 2

## 2023-08-04 MED ORDER — SIGHTPATH DOSE#1 BSS IO SOLN
INTRAOCULAR | Status: DC | PRN
  Administered 2023-08-04: 57 mL via OPHTHALMIC

## 2023-08-04 MED ORDER — ARMC OPHTHALMIC DILATING DROPS
OPHTHALMIC | Status: AC
  Filled 2023-08-04: qty 0.5

## 2023-08-04 MED ORDER — SIGHTPATH DOSE#1 NA HYALUR & NA CHOND-NA HYALUR IO KIT
PACK | INTRAOCULAR | Status: DC | PRN
  Administered 2023-08-04: 1 via OPHTHALMIC

## 2023-08-04 MED ORDER — TETRACAINE HCL 0.5 % OP SOLN
OPHTHALMIC | Status: AC
  Filled 2023-08-04: qty 4

## 2023-08-04 MED ORDER — BRIMONIDINE TARTRATE-TIMOLOL 0.2-0.5 % OP SOLN
OPHTHALMIC | Status: DC | PRN
  Administered 2023-08-04: 1 [drp] via OPHTHALMIC

## 2023-08-04 MED ORDER — BSS IO SOLN
INTRAOCULAR | Status: DC | PRN
  Administered 2023-08-04: 15 mL via INTRAOCULAR

## 2023-08-04 MED ORDER — BALANCED SALT IO SOLN
INTRAOCULAR | Status: DC | PRN
  Administered 2023-08-04: 1 mL via INTRAOCULAR

## 2023-08-04 MED ORDER — MIDAZOLAM HCL 2 MG/2ML IJ SOLN
INTRAMUSCULAR | Status: DC | PRN
  Administered 2023-08-04: 2 mg via INTRAVENOUS

## 2023-08-04 MED ORDER — TETRACAINE HCL 0.5 % OP SOLN
1.0000 [drp] | OPHTHALMIC | Status: DC | PRN
  Administered 2023-08-04 (×3): 1 [drp] via OPHTHALMIC

## 2023-08-04 SURGICAL SUPPLY — 13 items
CATARACT SUITE SIGHTPATH (MISCELLANEOUS) ×1 IMPLANT
DISSECTOR HYDRO NUCLEUS 50X22 (MISCELLANEOUS) ×1 IMPLANT
DRSG TEGADERM 2-3/8X2-3/4 SM (GAUZE/BANDAGES/DRESSINGS) ×1 IMPLANT
FEE CATARACT SUITE SIGHTPATH (MISCELLANEOUS) ×1 IMPLANT
GLOVE BIOGEL PI IND STRL 8 (GLOVE) ×1 IMPLANT
GLOVE SURG LX STRL 7.5 STRW (GLOVE) ×1 IMPLANT
GLOVE SURG PROTEXIS BL SZ6.5 (GLOVE) ×1 IMPLANT
GLOVE SURG SYN 6.5 PF PI BL (GLOVE) ×1 IMPLANT
LENS IOL CLRN WGN WHL 24.5 (Intraocular Lens) IMPLANT
LENS IOL MONO WAGON WHEEL YLW (Intraocular Lens) ×1 IMPLANT
NDL FILTER BLUNT 18X1 1/2 (NEEDLE) ×1 IMPLANT
NEEDLE FILTER BLUNT 18X1 1/2 (NEEDLE) ×1 IMPLANT
SYR 3ML LL SCALE MARK (SYRINGE) IMPLANT

## 2023-08-04 NOTE — Transfer of Care (Signed)
 Immediate Anesthesia Transfer of Care Note  Patient: Edithe A Neyland  Procedure(s) Performed: PHACOEMULSIFICATION, CATARACT, WITH IOL INSERTION 7.27, 00:43.2 (Right)  Patient Location: PACU  Anesthesia Type: MAC  Level of Consciousness: awake, alert  and patient cooperative  Airway and Oxygen Therapy: Patient Spontanous Breathing and Patient connected to supplemental oxygen  Post-op Assessment: Post-op Vital signs reviewed, Patient's Cardiovascular Status Stable, Respiratory Function Stable, Patent Airway and No signs of Nausea or vomiting  Post-op Vital Signs: Reviewed and stable  Complications: No notable events documented.

## 2023-08-04 NOTE — H&P (Signed)
 [facility]   Primary Care Physician:  Jimmy Moulding, MD Ophthalmologist: Dr. Meg Spina  Pre-Procedure History & Physical: HPI:  Morgan Nicholson is a 78 y.o. female here for cataract surgery.   Past Medical History:  Diagnosis Date   Adenomatous polyp 08/29/2013   Aortic atherosclerosis (HCC)    Arthritis    Arthritis of both hips    Bilateral carotid artery disease (HCC) 01/24/2021   a.) CTA neck 01/24/2021: 55% RICA, 45% LICA   Bilateral cataracts    Cervical spinal stenosis    CKD (chronic kidney disease), stage III (HCC)    Diastolic dysfunction 01/25/2021   a.) TTE 01/25/2021: EF 60-65%, mild LVH, mild LA dil, G1DD   High cholesterol    Hyperplastic colon polyp    Hypertension    Implantable loop recorder present 03/04/2021   a.) Linq II device   Ischemic stroke (HCC) 01/24/2021   a.) MRI brain 01/24/2021: acute ischemia at the base of the LEFT central sulcus   OAB (overactive bladder)    OSA (obstructive sleep apnea)    a.) mild; does not require nocturnal PAP therapy   Osteopenia    Pneumonia    Skin cancer    T2DM (type 2 diabetes mellitus) (HCC)    Tinnitus    Vertebral artery stenosis, bilateral 01/25/2021   a.) CTA neck 01/25/2021: severe LEFT and moderate RIGHT vertebral artery origin stenosis   Vertigo     Past Surgical History:  Procedure Laterality Date   CARPAL TUNNEL RELEASE Right 05/01/2020   Procedure: CARPAL TUNNEL RELEASE ENDOSCOPIC;  Surgeon: Elner Hahn, MD;  Location: [facility] ORS;  Service: Orthopedics;  Laterality: Right;   colonic polyps     COLONOSCOPY WITH PROPOFOL  N/A 02/23/2017   Procedure: COLONOSCOPY WITH PROPOFOL ;  Surgeon: Cassie Click, MD;  Location: [facility] ENDOSCOPY;  Service: Endoscopy;  Laterality: N/A;   LOOP RECORDER IMPLANT N/A 03/04/2021   Procedure: LOOP RECORDER IMPLANT; Location: [facility]; Surgeon: Colonel Dears, MD   skin cancer removal Bilateral    arms   TOTAL HIP ARTHROPLASTY Right 11/01/2022    Procedure: TOTAL HIP ARTHROPLASTY;  Surgeon: Arlyne Lame, MD;  Location: [facility] ORS;  Service: Orthopedics;  Laterality: Right;   TRIGGER FINGER RELEASE Right 05/01/2020   Procedure: RELEASE TRIGGER FINGER/A-1 PULLEY;  Surgeon: Elner Hahn, MD;  Location: [facility] ORS;  Service: Orthopedics;  Laterality: Right;   TUBAL LIGATION      Prior to Admission medications   Medication Sig Start Date End Date Taking? Authorizing Provider  acetaminophen  (TYLENOL ) 500 MG tablet Take 1,000 mg by mouth every 6 (six) hours as needed for mild pain.   Yes [provider]  amLODipine  (NORVASC ) 5 MG tablet Take 5 mg by mouth daily.   Yes [provider]  aspirin  EC 81 MG tablet Take 1 tablet (81 mg total) by mouth in the morning and at bedtime. Take it with your plavix  11/02/22  Yes Wadie Guile, PA-C  atorvastatin  (LIPITOR ) 80 MG tablet Take 80 mg by mouth every evening.   Yes [provider]  Calcium  Carb-Cholecalciferol (CALCIUM  600+D3 PO) Take 1 tablet by mouth in the morning and at bedtime.   Yes [provider]  calcium  carbonate (TUMS EX) 750 MG chewable tablet Chew 1 tablet by mouth as needed for heartburn.   Yes [provider]  carvedilol  (COREG ) 12.5 MG tablet Take 12.5 mg by mouth 2 (two) times daily. 04/01/20  Yes [provider]  glimepiride  (  AMARYL ) 2 MG tablet Take 2 mg by mouth daily with supper.   Yes [provider]  lisinopril  (PRINIVIL ,ZESTRIL ) 40 MG tablet Take 40 mg daily by mouth.   Yes [provider]  metFORMIN  (GLUCOPHAGE ) 1000 MG tablet Take 1,000 mg by mouth 2 (two) times daily with a meal.   Yes [provider]  celecoxib  (CELEBREX ) 200 MG capsule Take 1 capsule (200 mg total) by mouth 2 (two) times daily. Patient not taking: Reported on 07/27/2023 11/02/22   Wadie Guile, PA-C  Continuous Blood Gluc Receiver (DEXCOM G6 RECEIVER) DEVI Use receiver for glucose monitoring 07/30/21   [provider]  Continuous Blood Gluc Sensor (DEXCOM G7 SENSOR) MISC Use 1 Device every 10 (ten) days 12/03/21   [provider]  fluocinonide (LIDEX) 0.05 % external solution Apply 1 application topically daily as needed (scalp dermatitis). 12/13/19   [provider]  Insulin  Glargine-Lixisenatide  (SOLIQUA  [place]) Inject 19 Units into the skin at bedtime. In the morning    [provider]  ketoconazole (NIZORAL) 2 % shampoo Apply 1 application  topically 2 (two) times a week. 02/12/20   [provider]  oxyCODONE  (OXY IR/ROXICODONE ) 5 MG immediate release tablet Take 1 tablet (5 mg total) by mouth every 4 (four) hours as needed for moderate pain (pain score 4-6). Patient not taking: Reported on 07/27/2023 11/02/22   Wadie Guile, PA-C  traMADol  (ULTRAM ) 50 MG tablet Take 1-2 tablets (50-100 mg total) by mouth every 4 (four) hours as needed for moderate pain. Patient not taking: Reported on 07/27/2023 11/02/22   Wadie Guile, PA-C    Allergies as of 07/14/2023   (No Known Allergies)    Family History  Problem Relation Age of Onset   Breast cancer Neg Hx     Social History   Socioeconomic History   Marital status: Divorced    Spouse name: Not on file   Number of children: Not on file   Years of education: Not on file   Highest education level: Not on file  Occupational History   Not on file  Tobacco Use   Smoking status: Never   Smokeless tobacco: Never  Vaping Use   Vaping status: Never Used  Substance and Sexual Activity   Alcohol use: No   Drug use: No   Sexual activity: Not on file  Other Topics Concern   Not on file  Social History Narrative   Patient lives alone. Daughter will stay with patient after surgery   Social Drivers of Health   Financial Resource Strain: Low Risk  (07/07/2023)   Received from [facility]   Overall Financial Resource Strain (CARDIA)    Difficulty of Paying Living Expenses: Not hard at all  Food  Insecurity: No Food Insecurity (07/07/2023)   Received from [facility]   Hunger Vital Sign    Worried About Running Out of Food in the Last Year: Never true    Ran Out of Food in the Last Year: Never true  Transportation Needs: No Transportation Needs (07/07/2023)   Received from [facility]    In the past 12 months, has lack of transportation kept you from medical appointments or from getting medications?: No    Lack of Transportation (Non-Medical): No  Physical Activity: Not on file  Stress: Not on file  Social Connections: Not on file  Intimate Partner Violence: Not At Risk (11/01/2022)   Humiliation, Afraid, Rape, and  Kick questionnaire    Fear of Current or Ex-Partner: No    Emotionally Abused: No    Physically Abused: No    Sexually Abused: No    Review of Systems: See HPI, otherwise negative ROS  Physical Exam: Ht 5\' 5"  (1.651 m)   Wt 73.9 kg   BMI 27.12 kg/m  General:   Alert, cooperative in NAD Head:  Normocephalic and atraumatic. Respiratory:  Normal work of breathing. Cardiovascular:  RRR  Impression/Plan: Morgan Nicholson is here for cataract surgery.  Risks, benefits, limitations, and alternatives regarding cataract surgery have been reviewed with the patient.  Questions have been answered.  All parties agreeable.   Morgan Fus, MD  08/04/2023, 7:22 AM

## 2023-08-04 NOTE — Op Note (Signed)
 OPERATIVE NOTE  Morgan Nicholson [id number] 08/04/2023   PREOPERATIVE DIAGNOSIS: Nuclear sclerotic cataract right eye. H25.11   POSTOPERATIVE DIAGNOSIS: Nuclear sclerotic cataract right eye. H25.11   PROCEDURE:  Phacoemusification with posterior chamber intraocular lens placement of the right eye  Ultrasound time: Procedure(s): PHACOEMULSIFICATION, CATARACT, WITH IOL INSERTION 7.27, 00:43.2 (Right)  LENS:   Implant Name Type Inv. Item Serial No. Manufacturer Lot No. LRB No. Used Action  LENS IOL MONO WAGON WHEEL YLW - [id number] Intraocular Lens LENS IOL MONO WAGON [place] YLW [id number] [employer]  Right 1 Implanted      SURGEON:  Rosy Cooper. Donalda Fruit, MD   ANESTHESIA:  Topical with tetracaine  drops, augmented with 1% preservative-free intracameral lidocaine .   COMPLICATIONS:  None.   DESCRIPTION OF PROCEDURE:  The patient was identified in the holding room and transported to the operating room and placed in the supine position under the operating microscope.  The right eye was identified as the operative eye, which was prepped and draped in the usual sterile ophthalmic fashion.   A 1 millimeter clear-corneal paracentesis was made superotemporally. Preservative-free 1% lidocaine  mixed with 1:1,000 bisulfite-free aqueous solution of epinephrine  was injected into the anterior chamber. The anterior chamber was then filled with Viscoat viscoelastic. A 2.4 millimeter keratome was used to make a clear-corneal incision inferotemporally. A curvilinear capsulorrhexis was made with a cystotome and capsulorrhexis forceps. Balanced salt  solution was used to hydrodissect and hydrodelineate the nucleus. Phacoemulsification was then used to remove the lens nucleus and epinucleus. The remaining cortex was then removed using the irrigation and aspiration handpiece. Provisc was then placed into the capsular bag to distend it for lens placement. A +24.50 D [id number] intraocular lens was then injected into the capsular  bag. The remaining viscoelastic was aspirated.   Wounds were hydrated with balanced salt  solution.  The anterior chamber was inflated to a physiologic pressure with balanced salt  solution.  No wound leaks were noted. Moxifloxacin  was injected intracamerally.  Timolol  and Brimonidine  drops were applied to the eye.  The patient was taken to the recovery room in stable condition without complications of anesthesia or surgery.  Morgan Nicholson 08/04/2023, 9:38 AM

## 2023-08-04 NOTE — Anesthesia Postprocedure Evaluation (Signed)
 Anesthesia Post Note  Patient: Morgan Nicholson  Procedure(s) Performed: PHACOEMULSIFICATION, CATARACT, WITH IOL INSERTION 7.27, 00:43.2 (Right)  Patient location during evaluation: PACU Anesthesia Type: MAC Level of consciousness: awake and alert Pain management: pain level controlled Vital Signs Assessment: post-procedure vital signs reviewed and stable Respiratory status: spontaneous breathing, nonlabored ventilation, respiratory function stable and patient connected to nasal cannula oxygen Cardiovascular status: stable and blood pressure returned to baseline Postop Assessment: no apparent nausea or vomiting Anesthetic complications: no   No notable events documented.   Last Vitals:  Vitals:   08/04/23 0940 08/04/23 0945  BP: 109/77 117/68  Pulse: 62 64  Resp: 15 14  Temp: (!) 36.1 C   SpO2: 96% 96%    Last Pain:  Vitals:   08/04/23 0945  TempSrc:   PainSc: 0-No pain                 Launi Asencio C Corneluis Allston

## 2023-08-05 ENCOUNTER — Encounter: Payer: Self-pay | Admitting: Ophthalmology

## 2023-08-05 ENCOUNTER — Other Ambulatory Visit: Payer: Self-pay

## 2023-08-05 DIAGNOSIS — H2512 Age-related nuclear cataract, left eye: Secondary | ICD-10-CM | POA: Diagnosis not present

## 2023-08-08 NOTE — Anesthesia Preprocedure Evaluation (Addendum)
 Anesthesia Evaluation  Patient identified by MRN, date of birth, ID band Patient awake    Reviewed: Allergy & Precautions, NPO status , Patient's Chart, lab work & pertinent test results  History of Anesthesia Complications Negative for: history of anesthetic complications  Airway Mallampati: IV   Neck ROM: Full    Dental  (+) Upper Dentures   Pulmonary sleep apnea    Pulmonary exam normal breath sounds clear to auscultation       Cardiovascular hypertension, + Peripheral Vascular Disease (bilateral carotid disease)  Normal cardiovascular exam Rhythm:Regular Rate:Normal  01-25-21 echo  1. Left ventricular ejection fraction, by estimation, is 60 to 65%. The left ventricle has normal function. The left ventricle has no regional wall motion abnormalities. There is mild left ventricular hypertrophy. Left ventricular diastolic parameters are consistent with Grade I diastolic dysfunction (impaired relaxation).   2. Right ventricular systolic function is normal. The right ventricular size is normal. There is normal pulmonary artery systolic pressure. The estimated right ventricular systolic pressure is 31.8 mmHg.   3. Left atrial size was mildly dilated.   4. The inferior vena cava is normal in size with greater than 50% respiratory variability, suggesting right atrial pressure of 3 mmHg.       Neuro/Psych Vertigo  CVA (2022; mild residual imbalance)    GI/Hepatic negative GI ROS,,,  Endo/Other  diabetes, Type 2    Renal/GU Renal disease (stage III CKD)     Musculoskeletal  (+) Arthritis ,    Abdominal   Peds  Hematology   Anesthesia Other Findings   Reproductive/Obstetrics                             Anesthesia Physical Anesthesia Plan  ASA: 3  Anesthesia Plan: MAC   Post-op Pain Management:    Induction: Intravenous  PONV Risk Score and Plan: 2 and Treatment may vary due to age or  medical condition, Midazolam  and TIVA  Airway Management Planned: Natural Airway and Nasal Cannula  Additional Equipment:   Intra-op Plan:   Post-operative Plan:   Informed Consent: I have reviewed the patients History and Physical, chart, labs and discussed the procedure including the risks, benefits and alternatives for the proposed anesthesia with the patient or authorized representative who has indicated his/her understanding and acceptance.     Dental advisory given  Plan Discussed with: CRNA  Anesthesia Plan Comments: (LMA/GETA backup discussed.  Patient consented for risks of anesthesia including but not limited to:  - adverse reactions to medications - damage to eyes, teeth, lips or other oral mucosa - nerve damage due to positioning  - sore throat or hoarseness - damage to heart, brain, nerves, lungs, other parts of body or loss of life  Informed patient about role of CRNA in peri- and intra-operative care.  Patient voiced understanding.)        Anesthesia Quick Evaluation

## 2023-08-16 NOTE — Discharge Instructions (Signed)

## 2023-08-18 ENCOUNTER — Encounter: Payer: Self-pay | Admitting: Ophthalmology

## 2023-08-18 ENCOUNTER — Ambulatory Visit
Admission: RE | Admit: 2023-08-18 | Discharge: 2023-08-18 | Disposition: A | Discharge status: Home / Self Care | Attending: Ophthalmology | Admitting: Ophthalmology

## 2023-08-18 ENCOUNTER — Other Ambulatory Visit: Payer: Self-pay

## 2023-08-18 ENCOUNTER — Encounter
Admission: RE | Disposition: A | Discharge status: Home / Self Care | Payer: Self-pay | Source: Home / Self Care | Attending: Ophthalmology

## 2023-08-18 ENCOUNTER — Ambulatory Visit: Payer: Self-pay | Admitting: Anesthesiology

## 2023-08-18 DIAGNOSIS — Z8673 Personal history of transient ischemic attack (TIA), and cerebral infarction without residual deficits: Secondary | ICD-10-CM | POA: Diagnosis not present

## 2023-08-18 DIAGNOSIS — Z794 Long term (current) use of insulin: Secondary | ICD-10-CM | POA: Diagnosis not present

## 2023-08-18 DIAGNOSIS — E1122 Type 2 diabetes mellitus with diabetic chronic kidney disease: Secondary | ICD-10-CM | POA: Insufficient documentation

## 2023-08-18 DIAGNOSIS — G4730 Sleep apnea, unspecified: Secondary | ICD-10-CM | POA: Diagnosis not present

## 2023-08-18 DIAGNOSIS — H25012 Cortical age-related cataract, left eye: Secondary | ICD-10-CM | POA: Insufficient documentation

## 2023-08-18 DIAGNOSIS — H2512 Age-related nuclear cataract, left eye: Secondary | ICD-10-CM | POA: Diagnosis not present

## 2023-08-18 DIAGNOSIS — E1151 Type 2 diabetes mellitus with diabetic peripheral angiopathy without gangrene: Secondary | ICD-10-CM | POA: Diagnosis not present

## 2023-08-18 DIAGNOSIS — I129 Hypertensive chronic kidney disease with stage 1 through stage 4 chronic kidney disease, or unspecified chronic kidney disease: Secondary | ICD-10-CM | POA: Insufficient documentation

## 2023-08-18 DIAGNOSIS — N183 Chronic kidney disease, stage 3 unspecified: Secondary | ICD-10-CM | POA: Diagnosis not present

## 2023-08-18 DIAGNOSIS — Z7984 Long term (current) use of oral hypoglycemic drugs: Secondary | ICD-10-CM | POA: Diagnosis not present

## 2023-08-18 DIAGNOSIS — E1136 Type 2 diabetes mellitus with diabetic cataract: Secondary | ICD-10-CM | POA: Insufficient documentation

## 2023-08-18 DIAGNOSIS — N1831 Chronic kidney disease, stage 3a: Secondary | ICD-10-CM | POA: Diagnosis not present

## 2023-08-18 DIAGNOSIS — H2511 Age-related nuclear cataract, right eye: Secondary | ICD-10-CM | POA: Diagnosis present

## 2023-08-18 HISTORY — PX: CATARACT EXTRACTION W/PHACO: SHX586

## 2023-08-18 LAB — GLUCOSE, CAPILLARY: Glucose-Capillary: 116 mg/dL — ABNORMAL HIGH (ref 70–99)

## 2023-08-18 SURGERY — PHACOEMULSIFICATION, CATARACT, WITH IOL INSERTION
Anesthesia: Monitor Anesthesia Care | Site: Eye | Laterality: Left

## 2023-08-18 MED ORDER — TETRACAINE HCL 0.5 % OP SOLN
OPHTHALMIC | Status: AC
  Filled 2023-08-18: qty 4

## 2023-08-18 MED ORDER — MIDAZOLAM HCL 2 MG/2ML IJ SOLN
INTRAMUSCULAR | Status: AC
  Filled 2023-08-18: qty 2

## 2023-08-18 MED ORDER — SIGHTPATH DOSE#1 BSS IO SOLN
INTRAOCULAR | Status: DC | PRN
  Administered 2023-08-18: 15 mL via INTRAOCULAR

## 2023-08-18 MED ORDER — BALANCED SALT IO SOLN
INTRAOCULAR | Status: DC | PRN
  Administered 2023-08-18: 4 mL via INTRAOCULAR

## 2023-08-18 MED ORDER — MOXIFLOXACIN HCL 0.5 % OP SOLN
OPHTHALMIC | Status: DC | PRN
  Administered 2023-08-18: .2 mL via OPHTHALMIC

## 2023-08-18 MED ORDER — TETRACAINE HCL 0.5 % OP SOLN
1.0000 [drp] | OPHTHALMIC | Status: DC | PRN
  Administered 2023-08-18 (×3): 1 [drp] via OPHTHALMIC

## 2023-08-18 MED ORDER — BRIMONIDINE TARTRATE-TIMOLOL 0.2-0.5 % OP SOLN
OPHTHALMIC | Status: DC | PRN
  Administered 2023-08-18: 1 [drp] via OPHTHALMIC

## 2023-08-18 MED ORDER — ARMC OPHTHALMIC DILATING DROPS
1.0000 | OPHTHALMIC | Status: DC | PRN
  Administered 2023-08-18 (×3): 1 via OPHTHALMIC

## 2023-08-18 MED ORDER — SIGHTPATH DOSE#1 BSS IO SOLN
INTRAOCULAR | Status: DC | PRN
  Administered 2023-08-18: 74 mL via OPHTHALMIC

## 2023-08-18 MED ORDER — SIGHTPATH DOSE#1 NA HYALUR & NA CHOND-NA HYALUR IO KIT
PACK | INTRAOCULAR | Status: DC | PRN
  Administered 2023-08-18: 1 via OPHTHALMIC

## 2023-08-18 MED ORDER — ARMC OPHTHALMIC DILATING DROPS
OPHTHALMIC | Status: AC
  Filled 2023-08-18: qty 0.5

## 2023-08-18 MED ORDER — MIDAZOLAM HCL 2 MG/2ML IJ SOLN
INTRAMUSCULAR | Status: DC | PRN
  Administered 2023-08-18: 2 mg via INTRAVENOUS

## 2023-08-18 SURGICAL SUPPLY — 13 items
CATARACT SUITE SIGHTPATH (MISCELLANEOUS) ×1 IMPLANT
DISSECTOR HYDRO NUCLEUS 50X22 (MISCELLANEOUS) ×1 IMPLANT
DRSG TEGADERM 2-3/8X2-3/4 SM (GAUZE/BANDAGES/DRESSINGS) ×1 IMPLANT
FEE CATARACT SUITE SIGHTPATH (MISCELLANEOUS) ×1 IMPLANT
GLOVE BIOGEL PI IND STRL 8 (GLOVE) ×1 IMPLANT
GLOVE SURG LX STRL 7.5 STRW (GLOVE) ×1 IMPLANT
GLOVE SURG PROTEXIS BL SZ6.5 (GLOVE) ×1 IMPLANT
GLOVE SURG SYN 6.5 PF PI BL (GLOVE) ×1 IMPLANT
LENS CLAREON WAGON WHEEL25.0 ×1 IMPLANT
LENS IOL CLRN WGN WHL 25.0 IMPLANT
NDL FILTER BLUNT 18X1 1/2 (NEEDLE) ×1 IMPLANT
NEEDLE FILTER BLUNT 18X1 1/2 (NEEDLE) ×1 IMPLANT
SYR 3ML LL SCALE MARK (SYRINGE) ×1 IMPLANT

## 2023-08-18 NOTE — H&P (Signed)
 [facility]   Primary Care Physician:  Jimmy Moulding, MD Ophthalmologist: Dr. Meg Spina  Pre-Procedure History & Physical: HPI:  Morgan Nicholson is a 78 y.o. female here for cataract surgery.   Past Medical History:  Diagnosis Date   Adenomatous polyp 08/29/2013   Aortic atherosclerosis (HCC)    Arthritis    Arthritis of both hips    Bilateral carotid artery disease (HCC) 01/24/2021   a.) CTA neck 01/24/2021: 55% RICA, 45% LICA   Bilateral cataracts    Cervical spinal stenosis    CKD (chronic kidney disease), stage III (HCC)    Diastolic dysfunction 01/25/2021   a.) TTE 01/25/2021: EF 60-65%, mild LVH, mild LA dil, G1DD   High cholesterol    Hyperplastic colon polyp    Hypertension    Implantable loop recorder present 03/04/2021   a.) Linq II device   Ischemic stroke (HCC) 01/24/2021   a.) MRI brain 01/24/2021: acute ischemia at the base of the LEFT central sulcus   OAB (overactive bladder)    OSA (obstructive sleep apnea)    a.) mild; does not require nocturnal PAP therapy   Osteopenia    Pneumonia    Skin cancer    T2DM (type 2 diabetes mellitus) (HCC)    Tinnitus    Vertebral artery stenosis, bilateral 01/25/2021   a.) CTA neck 01/25/2021: severe LEFT and moderate RIGHT vertebral artery origin stenosis   Vertigo     Past Surgical History:  Procedure Laterality Date   CARPAL TUNNEL RELEASE Right 05/01/2020   Procedure: CARPAL TUNNEL RELEASE ENDOSCOPIC;  Surgeon: Elner Hahn, MD;  Location: [facility] ORS;  Service: Orthopedics;  Laterality: Right;   CATARACT EXTRACTION W/PHACO Right 08/04/2023   Procedure: PHACOEMULSIFICATION, CATARACT, WITH IOL INSERTION 7.27, 00:43.2;  Surgeon: Trudi Fus, MD;  Location: [facility];  Service: Ophthalmology;  Laterality: Right;   colonic polyps     COLONOSCOPY WITH PROPOFOL  N/A 02/23/2017   Procedure: COLONOSCOPY WITH PROPOFOL ;  Surgeon: Cassie Click, MD;  Location: [facility] END[facility];  Service:  Endoscopy;  Laterality: N/A;   LOOP RECORDER IMPLANT N/A 03/04/2021   Procedure: LOOP RECORDER IMPLANT; Location: [facility]; Surgeon: Colonel Dears, MD   skin cancer removal Bilateral    arms   TOTAL HIP ARTHROPLASTY Right 11/01/2022   Procedure: TOTAL HIP ARTHROPLASTY;  Surgeon: Arlyne Lame, MD;  Location: [facility] ORS;  Service: Orthopedics;  Laterality: Right;   TRIGGER FINGER RELEASE Right 05/01/2020   Procedure: RELEASE TRIGGER FINGER/A-1 PULLEY;  Surgeon: Elner Hahn, MD;  Location: [facility] ORS;  Service: Orthopedics;  Laterality: Right;   TUBAL LIGATION      Prior to Admission medications   Medication Sig Start Date End Date Taking? Authorizing Provider  acetaminophen  (TYLENOL ) 500 MG tablet Take 1,000 mg by mouth every 6 (six) hours as needed for mild pain.   Yes [provider]  amLODipine  (NORVASC ) 5 MG tablet Take 5 mg by mouth daily.   Yes [provider]  aspirin  EC 81 MG tablet Take 1 tablet (81 mg total) by mouth in the morning and at bedtime. Take it with your plavix  11/02/22  Yes Wadie Guile, PA-C  atorvastatin  (LIPITOR ) 80 MG tablet Take 80 mg by mouth every evening.   Yes [provider]  Calcium  Carb-Cholecalciferol (CALCIUM  600+D3 PO) Take 1 tablet by mouth in the morning and at bedtime.   Yes [provider]  calcium  carbonate (TUMS EX) 750 MG chewable tablet Chew 1 tablet by  mouth as needed for heartburn.   Yes [provider]  carvedilol  (COREG ) 12.5 MG tablet Take 12.5 mg by mouth 2 (two) times daily. 04/01/20  Yes [provider]  Continuous Blood Gluc Receiver (DEXCOM G6 RECEIVER) DEVI Use receiver for glucose monitoring 07/30/21  Yes [provider]  Continuous Blood Gluc Sensor (DEXCOM G7 SENSOR) MISC Use 1 Device every 10 (ten) days 12/03/21  Yes [provider]  fluocinonide (LIDEX) 0.05 % external solution Apply 1 application topically daily as needed (scalp dermatitis). 12/13/19  Yes  [provider]  glimepiride  (AMARYL ) 2 MG tablet Take 2 mg by mouth daily with supper.   Yes [provider]  Insulin  Glargine-Lixisenatide  (SOLIQUA  [place]) Inject 19 Units into the skin at bedtime. In the morning   Yes [provider]  ketoconazole (NIZORAL) 2 % shampoo Apply 1 application  topically 2 (two) times a week. 02/12/20  Yes [provider]  lisinopril  (PRINIVIL ,ZESTRIL ) 40 MG tablet Take 40 mg daily by mouth.   Yes [provider]  metFORMIN  (GLUCOPHAGE ) 1000 MG tablet Take 1,000 mg by mouth 2 (two) times daily with a meal.   Yes [provider]  oxybutynin  (DITROPAN ) 5 MG tablet Take 5 mg by mouth 3 (three) times daily.   Yes [provider]  celecoxib  (CELEBREX ) 200 MG capsule Take 1 capsule (200 mg total) by mouth 2 (two) times daily. Patient not taking: Reported on 07/27/2023 11/02/22   Wadie Guile, PA-C  oxyCODONE  (OXY IR/ROXICODONE ) 5 MG immediate release tablet Take 1 tablet (5 mg total) by mouth every 4 (four) hours as needed for moderate pain (pain score 4-6). Patient not taking: Reported on 07/27/2023 11/02/22   Wadie Guile, PA-C  traMADol  (ULTRAM ) 50 MG tablet Take 1-2 tablets (50-100 mg total) by mouth every 4 (four) hours as needed for moderate pain. Patient not taking: Reported on 07/27/2023 11/02/22   Wadie Guile, PA-C    Allergies as of 07/14/2023   (No Known Allergies)    Family History  Problem Relation Age of Onset   Breast cancer Neg Hx     Social History   Socioeconomic History   Marital status: Divorced    Spouse name: Not on file   Number of children: Not on file   Years of education: Not on file   Highest education level: Not on file  Occupational History   Not on file  Tobacco Use   Smoking status: Never   Smokeless tobacco: Never  Vaping Use   Vaping status: Never Used  Substance and Sexual Activity   Alcohol use: No   Drug use: No   Sexual activity: Not on file  Other  Topics Concern   Not on file  Social History Narrative   Patient lives alone. Daughter will stay with patient after surgery   Social Drivers of Health   Financial Resource Strain: Low Risk  (07/07/2023)   Received [facility]   Overall Financial Resource Strain (CARDIA)    Difficulty of Paying Living Expenses: Not hard at all  Food Insecurity: No Food Insecurity (07/07/2023)   Received from [facility]   Hunger Vital Sign    Worried About Running Out of Food in the Last Year: Never true    Ran Out of Food in the Last Year: Never true  Transportation Needs: No Transportation Needs (07/07/2023)   Received from [facility]    In the past  12 months, has lack of transportation kept you from medical appointments or from getting medications?: No    Lack of Transportation (Non-Medical): No  Physical Activity: Not on file  Stress: Not on file  Social Connections: Not on file  Intimate Partner Violence: Not At Risk (11/01/2022)   Humiliation, Afraid, Rape, and Kick questionnaire    Fear of Current or Ex-Partner: No    Emotionally Abused: No    Physically Abused: No    Sexually Abused: No    Review of Systems: See HPI, otherwise negative ROS  Physical Exam: BP 137/85   Pulse 64   Temp (!) 97.2 F (36.2 C) (Temporal)   Resp 14   Ht 5\' 5"  (1.651 m)   Wt 75.8 kg   SpO2 96%   BMI 27.79 kg/m  General:   Alert, cooperative in NAD Head:  Normocephalic and atraumatic. Respiratory:  Normal work of breathing. Cardiovascular:  RRR  Impression/Plan: Morgan Nicholson is here for cataract surgery.  Risks, benefits, limitations, and alternatives regarding cataract surgery have been reviewed with the patient.  Questions have been answered.  All parties agreeable.   Trudi Fus, MD  08/18/2023, 7:11 AM

## 2023-08-18 NOTE — Op Note (Signed)
 OPERATIVE NOTE  FLORELLA OLAND [id number] 08/18/2023   PREOPERATIVE DIAGNOSIS: Nuclear sclerotic cataract left eye. H25.12   POSTOPERATIVE DIAGNOSIS: Nuclear sclerotic cataract left eye. H25.12   PROCEDURE:  Phacoemusification with posterior chamber intraocular lens placement of the left eye  Ultrasound time: Procedure(s): PHACOEMULSIFICATION, CATARACT, WITH IOL INSERTION 5.19 00:41.2 (Left)  LENS:   Implant Name Type Inv. Item Serial No. Manufacturer Lot No. LRB No. Used Action  LENS Pete Brand [place] - [id number]  LENS Pete Brand [facility] [id number] [facility]  Left 1 Implanted      SURGEON:  Rosy Cooper. Donalda Fruit, MD   ANESTHESIA:  Topical with tetracaine  drops, augmented with 1% preservative-free intracameral lidocaine .   COMPLICATIONS:  None.   DESCRIPTION OF PROCEDURE:  The patient was identified in the holding room and transported to the operating room and placed in the supine position under the operating microscope.  The left eye was identified as the operative eye, which was prepped and draped in the usual sterile ophthalmic fashion.   A 1 millimeter clear-corneal paracentesis was made inferotemporally. Preservative-free 1% lidocaine  mixed with 1:1,000 bisulfite-free aqueous solution of epinephrine  was injected into the anterior chamber. The anterior chamber was then filled with Viscoat viscoelastic. A 2.4 millimeter keratome was used to make a clear-corneal incision superotemporally. A curvilinear capsulorrhexis was made with a cystotome and capsulorrhexis forceps. Balanced salt  solution was used to hydrodissect and hydrodelineate the nucleus. Phacoemulsification was then used to remove the lens nucleus and epinucleus. The remaining cortex was then removed using the irrigation and aspiration handpiece. Provisc was then placed into the capsular bag to distend it for lens placement. A +25.00 D [id number] intraocular lens was then injected into the capsular bag. The remaining  viscoelastic was aspirated.   Wounds were hydrated with balanced salt  solution.  The anterior chamber was inflated to a physiologic pressure with balanced salt  solution.  No wound leaks were noted. Moxifloxacin  was injected intracamerally.  Timolol  and Brimonidine  drops were applied to the eye.  The patient was taken to the recovery room in stable condition without complications of anesthesia or surgery.  Meryl Acosta [place] 08/18/2023, 7:58 AM

## 2023-08-18 NOTE — Transfer of Care (Signed)
 Immediate Anesthesia Transfer of Care Note  Patient: Morgan Nicholson  Procedure(s) Performed: PHACOEMULSIFICATION, CATARACT, WITH IOL INSERTION 5.19 00:41.2 (Left: Eye)  Patient Location: PACU  Anesthesia Type: MAC  Level of Consciousness: awake, alert  and patient cooperative  Airway and Oxygen Therapy: Patient Spontanous Breathing and Patient connected to supplemental oxygen  Post-op Assessment: Post-op Vital signs reviewed, Patient's Cardiovascular Status Stable, Respiratory Function Stable, Patent Airway and No signs of Nausea or vomiting  Post-op Vital Signs: Reviewed and stable  Complications: No notable events documented.

## 2023-08-18 NOTE — Anesthesia Postprocedure Evaluation (Signed)
 Anesthesia Post Note  Patient: Morgan Nicholson  Procedure(s) Performed: PHACOEMULSIFICATION, CATARACT, WITH IOL INSERTION 5.19 00:41.2 (Left: Eye)  Patient location during evaluation: PACU Anesthesia Type: MAC Level of consciousness: awake and alert, oriented and patient cooperative Pain management: pain level controlled Vital Signs Assessment: post-procedure vital signs reviewed and stable Respiratory status: spontaneous breathing, nonlabored ventilation and respiratory function stable Cardiovascular status: blood pressure returned to baseline and stable Postop Assessment: adequate PO intake Anesthetic complications: no   No notable events documented.   Last Vitals:  Vitals:   08/18/23 0800 08/18/23 0805  BP: 103/66 108/70  Pulse: (!) 59 63  Resp: 16 13  Temp: (!) 36 C (!) 36 C  SpO2: 95% 95%    Last Pain:  Vitals:   08/18/23 0805  TempSrc:   PainSc: 0-No pain                 Arjen Deringer

## 2023-08-26 DIAGNOSIS — H02834 Dermatochalasis of left upper eyelid: Secondary | ICD-10-CM | POA: Diagnosis not present

## 2023-08-30 DIAGNOSIS — Z794 Long term (current) use of insulin: Secondary | ICD-10-CM | POA: Diagnosis not present

## 2023-08-30 DIAGNOSIS — N182 Chronic kidney disease, stage 2 (mild): Secondary | ICD-10-CM | POA: Diagnosis not present

## 2023-08-30 DIAGNOSIS — E1122 Type 2 diabetes mellitus with diabetic chronic kidney disease: Secondary | ICD-10-CM | POA: Diagnosis not present

## 2023-08-30 DIAGNOSIS — I152 Hypertension secondary to endocrine disorders: Secondary | ICD-10-CM | POA: Diagnosis not present

## 2023-08-30 DIAGNOSIS — E1169 Type 2 diabetes mellitus with other specified complication: Secondary | ICD-10-CM | POA: Diagnosis not present

## 2023-08-30 DIAGNOSIS — E1159 Type 2 diabetes mellitus with other circulatory complications: Secondary | ICD-10-CM | POA: Diagnosis not present

## 2023-08-30 DIAGNOSIS — E1165 Type 2 diabetes mellitus with hyperglycemia: Secondary | ICD-10-CM | POA: Diagnosis not present

## 2023-08-30 DIAGNOSIS — E785 Hyperlipidemia, unspecified: Secondary | ICD-10-CM | POA: Diagnosis not present

## 2023-08-30 DIAGNOSIS — Z8673 Personal history of transient ischemic attack (TIA), and cerebral infarction without residual deficits: Secondary | ICD-10-CM | POA: Diagnosis not present

## 2023-09-01 DIAGNOSIS — H02831 Dermatochalasis of right upper eyelid: Secondary | ICD-10-CM | POA: Diagnosis not present

## 2023-09-01 DIAGNOSIS — H02834 Dermatochalasis of left upper eyelid: Secondary | ICD-10-CM | POA: Diagnosis not present

## 2023-09-01 DIAGNOSIS — H5340 Unspecified visual field defects: Secondary | ICD-10-CM | POA: Diagnosis not present

## 2023-10-25 DIAGNOSIS — Z96641 Presence of right artificial hip joint: Secondary | ICD-10-CM | POA: Diagnosis not present

## 2023-11-03 DIAGNOSIS — H02831 Dermatochalasis of right upper eyelid: Secondary | ICD-10-CM | POA: Diagnosis not present

## 2023-11-03 DIAGNOSIS — H02834 Dermatochalasis of left upper eyelid: Secondary | ICD-10-CM | POA: Diagnosis not present

## 2023-11-30 DIAGNOSIS — E1165 Type 2 diabetes mellitus with hyperglycemia: Secondary | ICD-10-CM | POA: Diagnosis not present

## 2023-12-01 DIAGNOSIS — E1159 Type 2 diabetes mellitus with other circulatory complications: Secondary | ICD-10-CM | POA: Diagnosis not present

## 2023-12-01 DIAGNOSIS — Z794 Long term (current) use of insulin: Secondary | ICD-10-CM | POA: Diagnosis not present

## 2023-12-21 ENCOUNTER — Other Ambulatory Visit: Payer: Self-pay | Admitting: Internal Medicine

## 2023-12-21 DIAGNOSIS — Z1231 Encounter for screening mammogram for malignant neoplasm of breast: Secondary | ICD-10-CM

## 2024-01-02 DIAGNOSIS — E1122 Type 2 diabetes mellitus with diabetic chronic kidney disease: Secondary | ICD-10-CM | POA: Diagnosis not present

## 2024-01-02 DIAGNOSIS — I10 Essential (primary) hypertension: Secondary | ICD-10-CM | POA: Diagnosis not present

## 2024-01-02 DIAGNOSIS — E785 Hyperlipidemia, unspecified: Secondary | ICD-10-CM | POA: Diagnosis not present

## 2024-01-02 DIAGNOSIS — N182 Chronic kidney disease, stage 2 (mild): Secondary | ICD-10-CM | POA: Diagnosis not present

## 2024-01-02 DIAGNOSIS — E1169 Type 2 diabetes mellitus with other specified complication: Secondary | ICD-10-CM | POA: Diagnosis not present

## 2024-01-10 DIAGNOSIS — E785 Hyperlipidemia, unspecified: Secondary | ICD-10-CM | POA: Diagnosis not present

## 2024-01-10 DIAGNOSIS — R2689 Other abnormalities of gait and mobility: Secondary | ICD-10-CM | POA: Diagnosis not present

## 2024-01-10 DIAGNOSIS — Z1321 Encounter for screening for nutritional disorder: Secondary | ICD-10-CM | POA: Diagnosis not present

## 2024-01-10 DIAGNOSIS — Z8673 Personal history of transient ischemic attack (TIA), and cerebral infarction without residual deficits: Secondary | ICD-10-CM | POA: Diagnosis not present

## 2024-01-10 DIAGNOSIS — R3915 Urgency of urination: Secondary | ICD-10-CM | POA: Diagnosis not present

## 2024-01-10 DIAGNOSIS — E1169 Type 2 diabetes mellitus with other specified complication: Secondary | ICD-10-CM | POA: Diagnosis not present

## 2024-01-10 DIAGNOSIS — R2681 Unsteadiness on feet: Secondary | ICD-10-CM | POA: Diagnosis not present

## 2024-01-12 DIAGNOSIS — I779 Disorder of arteries and arterioles, unspecified: Secondary | ICD-10-CM | POA: Diagnosis not present

## 2024-01-12 DIAGNOSIS — I129 Hypertensive chronic kidney disease with stage 1 through stage 4 chronic kidney disease, or unspecified chronic kidney disease: Secondary | ICD-10-CM | POA: Diagnosis not present

## 2024-01-12 DIAGNOSIS — N182 Chronic kidney disease, stage 2 (mild): Secondary | ICD-10-CM | POA: Diagnosis not present

## 2024-01-12 DIAGNOSIS — E1122 Type 2 diabetes mellitus with diabetic chronic kidney disease: Secondary | ICD-10-CM | POA: Diagnosis not present

## 2024-01-12 DIAGNOSIS — Z23 Encounter for immunization: Secondary | ICD-10-CM | POA: Diagnosis not present

## 2024-01-13 DIAGNOSIS — I10 Essential (primary) hypertension: Secondary | ICD-10-CM | POA: Diagnosis not present

## 2024-01-13 DIAGNOSIS — N182 Chronic kidney disease, stage 2 (mild): Secondary | ICD-10-CM | POA: Diagnosis not present

## 2024-01-13 DIAGNOSIS — Z8673 Personal history of transient ischemic attack (TIA), and cerebral infarction without residual deficits: Secondary | ICD-10-CM | POA: Diagnosis not present

## 2024-01-13 DIAGNOSIS — E785 Hyperlipidemia, unspecified: Secondary | ICD-10-CM | POA: Diagnosis not present

## 2024-01-13 DIAGNOSIS — E1169 Type 2 diabetes mellitus with other specified complication: Secondary | ICD-10-CM | POA: Diagnosis not present

## 2024-01-13 DIAGNOSIS — E1122 Type 2 diabetes mellitus with diabetic chronic kidney disease: Secondary | ICD-10-CM | POA: Diagnosis not present

## 2024-01-19 ENCOUNTER — Ambulatory Visit
Admission: RE | Admit: 2024-01-19 | Discharge: 2024-01-19 | Disposition: A | Discharge status: Home / Self Care | Source: Ambulatory Visit | Attending: Internal Medicine | Admitting: Internal Medicine

## 2024-01-19 DIAGNOSIS — Z1231 Encounter for screening mammogram for malignant neoplasm of breast: Secondary | ICD-10-CM | POA: Diagnosis not present

## 2024-01-24 DIAGNOSIS — H35033 Hypertensive retinopathy, bilateral: Secondary | ICD-10-CM | POA: Diagnosis not present

## 2024-01-24 DIAGNOSIS — H26493 Other secondary cataract, bilateral: Secondary | ICD-10-CM | POA: Diagnosis not present

## 2024-01-24 DIAGNOSIS — H40033 Anatomical narrow angle, bilateral: Secondary | ICD-10-CM | POA: Diagnosis not present

## 2024-01-24 DIAGNOSIS — H524 Presbyopia: Secondary | ICD-10-CM | POA: Diagnosis not present

## 2024-01-24 DIAGNOSIS — I10 Essential (primary) hypertension: Secondary | ICD-10-CM | POA: Diagnosis not present

## 2024-01-24 DIAGNOSIS — E119 Type 2 diabetes mellitus without complications: Secondary | ICD-10-CM | POA: Diagnosis not present

## 2024-03-05 DIAGNOSIS — E1165 Type 2 diabetes mellitus with hyperglycemia: Secondary | ICD-10-CM | POA: Diagnosis not present

## 2024-03-14 DIAGNOSIS — C44719 Basal cell carcinoma of skin of left lower limb, including hip: Secondary | ICD-10-CM | POA: Diagnosis not present

## 2024-04-03 ENCOUNTER — Ambulatory Visit
Admission: RE | Admit: 2024-04-03 | Discharge: 2024-04-03 | Disposition: A | Discharge status: Home / Self Care | Attending: Internal Medicine | Admitting: Internal Medicine

## 2024-04-03 ENCOUNTER — Encounter: Payer: Self-pay | Admitting: Internal Medicine

## 2024-04-03 ENCOUNTER — Ambulatory Visit: Admitting: Anesthesiology

## 2024-04-03 ENCOUNTER — Encounter: Admission: RE | Disposition: A | Payer: Self-pay | Attending: Internal Medicine

## 2024-04-03 DIAGNOSIS — D123 Benign neoplasm of transverse colon: Secondary | ICD-10-CM | POA: Insufficient documentation

## 2024-04-03 DIAGNOSIS — Z7984 Long term (current) use of oral hypoglycemic drugs: Secondary | ICD-10-CM | POA: Insufficient documentation

## 2024-04-03 DIAGNOSIS — Z1211 Encounter for screening for malignant neoplasm of colon: Secondary | ICD-10-CM | POA: Diagnosis present

## 2024-04-03 DIAGNOSIS — Z794 Long term (current) use of insulin: Secondary | ICD-10-CM | POA: Insufficient documentation

## 2024-04-03 DIAGNOSIS — I129 Hypertensive chronic kidney disease with stage 1 through stage 4 chronic kidney disease, or unspecified chronic kidney disease: Secondary | ICD-10-CM | POA: Insufficient documentation

## 2024-04-03 DIAGNOSIS — G4733 Obstructive sleep apnea (adult) (pediatric): Secondary | ICD-10-CM | POA: Diagnosis not present

## 2024-04-03 DIAGNOSIS — E1122 Type 2 diabetes mellitus with diabetic chronic kidney disease: Secondary | ICD-10-CM | POA: Insufficient documentation

## 2024-04-03 DIAGNOSIS — M1990 Unspecified osteoarthritis, unspecified site: Secondary | ICD-10-CM | POA: Insufficient documentation

## 2024-04-03 DIAGNOSIS — N183 Chronic kidney disease, stage 3 unspecified: Secondary | ICD-10-CM | POA: Insufficient documentation

## 2024-04-03 HISTORY — PX: COLONOSCOPY: SHX5424

## 2024-04-03 HISTORY — PX: POLYPECTOMY: SHX149

## 2024-04-03 LAB — GLUCOSE, CAPILLARY: Glucose-Capillary: 169 mg/dL — ABNORMAL HIGH (ref 70–99)

## 2024-04-03 SURGERY — COLONOSCOPY
Anesthesia: General

## 2024-04-03 MED ORDER — PHENYLEPHRINE 80 MCG/ML (10ML) SYRINGE FOR IV PUSH (FOR BLOOD PRESSURE SUPPORT): PREFILLED_SYRINGE | INTRAVENOUS | Status: DC | PRN

## 2024-04-03 MED ORDER — PROPOFOL 1000 MG/100ML IV EMUL
INTRAVENOUS | Status: AC
  Filled 2024-04-03: qty 100

## 2024-04-03 MED ORDER — PROPOFOL 500 MG/50ML IV EMUL
INTRAVENOUS | Status: DC | PRN
  Administered 2024-04-03: 150 ug/kg/min via INTRAVENOUS

## 2024-04-03 MED ORDER — LIDOCAINE HCL (PF) 2 % IJ SOLN
INTRAMUSCULAR | Status: AC
  Filled 2024-04-03: qty 5

## 2024-04-03 MED ORDER — SODIUM CHLORIDE 0.9 % IV SOLN: INTRAVENOUS | Status: DC

## 2024-04-03 MED ORDER — PROPOFOL 10 MG/ML IV BOLUS
INTRAVENOUS | Status: DC | PRN
  Administered 2024-04-03: 80 mg via INTRAVENOUS

## 2024-04-03 MED ORDER — LIDOCAINE HCL (CARDIAC) PF 100 MG/5ML IV SOSY
PREFILLED_SYRINGE | INTRAVENOUS | Status: DC | PRN
  Administered 2024-04-03: 100 mg via INTRAVENOUS

## 2024-04-03 NOTE — Interval H&P Note (Signed)
 History and Physical Interval Note:  04/03/2024 10:49 AM  Morgan Nicholson  has presented today for surgery, with the diagnosis of Hx of adenomatous colonic polyps (Z86.0101).  The various methods of treatment have been discussed with the patient and family. After consideration of risks, benefits and other options for treatment, the patient has consented to  Procedures: COLONOSCOPY (N/A) as a surgical intervention.  The patient's history has been reviewed, patient examined, no change in status, stable for surgery.  I have reviewed the patient's chart and labs.  Questions were answered to the patient's satisfaction.     [place], Jaysion Ramseyer

## 2024-04-03 NOTE — Op Note (Signed)
 [facility] Patient Name: Morgan Nicholson Procedure Date: 04/03/2024 11:00 AM MRN: [id number] Account #: [id number] Date of Birth: [birth year] Admit Type: Outpatient Age: 78 Room: [facility] ENDO ROOM 1 Gender: Female Note Status: Finalized Instrument Name: Colon Scope [id number] Procedure:             Colonoscopy Indications:           Surveillance: Personal history of adenomatous polyps                         on last colonoscopy > 5 years ago Providers:             Tanashia Ciesla K. Aundria MD, MD Referring MD:          Andriea Hasegawa K. Aundria MD, MD (Referring MD), Layman ORN.                         Lenon MD, MD (Referring MD) Medicines:             Propofol  per Anesthesia Complications:         No immediate complications. Estimated blood loss:                         Minimal. Procedure:             Pre-Anesthesia Assessment:                        - The risks and benefits of the procedure and the                         sedation options and risks were discussed with the                         patient. All questions were answered and informed                         consent was obtained.                        - Patient identification and proposed procedure were                         verified prior to the procedure by the nurse. The                         procedure was verified in the procedure room.                        - ASA Grade Assessment: III - A patient with severe                         systemic disease.                        - After reviewing the risks and benefits, the patient                         was deemed in satisfactory condition to undergo the  procedure.                        After obtaining informed consent, the colonoscope was                         passed under direct vision. Throughout the procedure,                         the patient's blood pressure, pulse, and oxygen                         saturations  were monitored continuously. The                         Colonoscope was introduced through the anus and                         advanced to the the cecum, identified by appendiceal                         orifice and ileocecal valve. The colonoscopy was                         performed without difficulty. The patient tolerated                         the procedure well. The quality of the bowel                         preparation was good. The ileocecal valve, appendiceal                         orifice, and rectum were photographed. Findings:      The perianal and digital rectal examinations were normal. Pertinent       negatives include normal sphincter tone and no palpable rectal lesions.      A 6 mm polyp was found in the transverse colon. The polyp was sessile.       The polyp was removed with a cold snare. Resection and retrieval were       complete. Estimated blood loss was minimal.      The exam was otherwise without abnormality on direct and retroflexion       views. Impression:            - One 6 mm polyp in the transverse colon, removed with                         a cold snare. Resected and retrieved.                        - The examination was otherwise normal on direct and                         retroflexion views. Recommendation:        - Patient has a contact number available for                         emergencies. The signs and symptoms of potential  delayed complications were discussed with the patient.                         Return to normal activities tomorrow. Written                         discharge instructions were provided to the patient.                        - Resume previous diet.                        - Continue present medications.                        - If polyps are benign or adenomatous without                         dysplasia, I will advise NO further colonoscopy due to                         advanced age and/or severe  comorbidity.                        - Return to GI office PRN.                        - The findings and recommendations were discussed with                         the patient. Procedure Code(s):     --- Professional ---                        [phone], Colonoscopy, flexible; with removal of                         tumor(s), polyp(s), or other lesion(s) by snare                         technique Diagnosis Code(s):     --- Professional ---                        D12.3, Benign neoplasm of transverse colon (hepatic                         flexure or splenic flexure)                        Z86.010, Personal history of colonic polyps CPT copyright 2022 [employer]. All rights reserved. The codes documented in this report are preliminary and upon coder review may  be revised to meet current compliance requirements. Ladell MARLA Boss MD, MD 04/03/2024 11:24:51 AM This report has been signed electronically. Number of Addenda: 0 Note Initiated On: 04/03/2024 11:00 AM Scope Withdrawal Time: 0 hours 9 minutes 28 seconds  Total Procedure Duration: 0 hours 16 minutes 28 seconds  Estimated Blood Loss:  Estimated blood loss was minimal.      [facility]

## 2024-04-03 NOTE — H&P (Signed)
 Outpatient short stay form Pre-procedure 04/03/2024 10:48 AM Morgan Nicholson, M.D.  Primary Physician: Layman Piety, M.D.  Reason for visit:  History of adenomatous  colon polyps  History of present illness:  [name] presents to the [facility] clinic at the request of her PCP for chief complaint of high-risk colon cancer surveillance 2/2 personal hx of adenomatous colon polyps. She had her last colonoscopy performed by Dr. Viktoria Nov 2018 which removed one subcentimeter tubular adenoma. A 5-year repeat was advised by Dr. Viktoria. She is curious if she still needs to have another colonoscopy given her age. She denies any known family history of colorectal cancer or advanced adenomas. She denies any acute GI complaints or concerns at this time. She typically has a BM every other day on average. She does not take stool softeners or laxatives. She denies any hematochezia, melena, fecal urgency, or fecal incontinence. She denies any issues with abdominal pain or abdominal cramping. Appetite and diet are stable without any unintentional weight loss. She has occasional indigestion and postprandial abdominal bloating symptoms. She uses TUMs as needed for symptom control. She notices most issues arise from consuming red sauce or peppers. She hasn't tried any anti-gas agents over the counter. She reports bloating is not serious and doesn't stop her from what she wants to do. She denies any complaints of esophageal dysphagia, odynophagia, early satiety, hoarseness, or epigastric abdominal pain.     Current Medications[1]  Medications Prior to Admission  Medication Sig Dispense Refill Last Dose/Taking   amLODipine  (NORVASC ) 5 MG tablet Take 5 mg by mouth daily.   04/02/2024   carvedilol  (COREG ) 12.5 MG tablet Take 12.5 mg by mouth 2 (two) times daily.   04/03/2024 Morning   lisinopril  (PRINIVIL ,ZESTRIL ) 40 MG tablet Take 40 mg daily by mouth.   04/03/2024 Morning   acetaminophen  (TYLENOL ) 500 MG  tablet Take 1,000 mg by mouth every 6 (six) hours as needed for mild pain.      aspirin  EC 81 MG tablet Take 1 tablet (81 mg total) by mouth in the morning and at bedtime. Take it with your plavix       atorvastatin  (LIPITOR ) 80 MG tablet Take 80 mg by mouth every evening.      Calcium  Carb-Cholecalciferol (CALCIUM  600+D3 PO) Take 1 tablet by mouth in the morning and at bedtime.      calcium  carbonate (TUMS EX) 750 MG chewable tablet Chew 1 tablet by mouth as needed for heartburn.      celecoxib  (CELEBREX ) 200 MG capsule Take 1 capsule (200 mg total) by mouth 2 (two) times daily. (Patient not taking: Reported on 07/27/2023) 60 capsule 1    Continuous Blood Gluc Receiver (DEXCOM G6 RECEIVER) DEVI Use receiver for glucose monitoring      Continuous Blood Gluc Sensor (DEXCOM G7 SENSOR) MISC Use 1 Device every 10 (ten) days      fluocinonide (LIDEX) 0.05 % external solution Apply 1 application topically daily as needed (scalp dermatitis).      glimepiride  (AMARYL ) 2 MG tablet Take 2 mg by mouth daily with supper.      Insulin  Glargine-Lixisenatide  (SOLIQUA  [place]) Inject 19 Units into the skin at bedtime. In the morning      ketoconazole (NIZORAL) 2 % shampoo Apply 1 application  topically 2 (two) times a week.      metFORMIN  (GLUCOPHAGE ) 1000 MG tablet Take 1,000 mg by mouth 2 (two) times daily with a meal.      oxybutynin  (DITROPAN ) 5 MG  tablet Take 5 mg by mouth 3 (three) times daily.      oxyCODONE  (OXY IR/ROXICODONE ) 5 MG immediate release tablet Take 1 tablet (5 mg total) by mouth every 4 (four) hours as needed for moderate pain (pain score 4-6). (Patient not taking: Reported on 07/27/2023) 30 tablet 0    traMADol  (ULTRAM ) 50 MG tablet Take 1-2 tablets (50-100 mg total) by mouth every 4 (four) hours as needed for moderate pain. (Patient not taking: Reported on 07/27/2023) 30 tablet 0      Allergies[2]   Past Medical History:  Diagnosis Date   Adenomatous polyp 08/29/2013   Aortic atherosclerosis     Arthritis    Arthritis of both hips    Bilateral carotid artery disease 01/24/2021   a.) CTA neck 01/24/2021: 55% RICA, 45% LICA   Bilateral cataracts    Cervical spinal stenosis    CKD (chronic kidney disease), stage III (HCC)    Diastolic dysfunction 01/25/2021   a.) TTE 01/25/2021: EF 60-65%, mild LVH, mild LA dil, G1DD   High cholesterol    Hyperplastic colon polyp    Hypertension    Implantable loop recorder present 03/04/2021   a.) Linq II device   Ischemic stroke (HCC) 01/24/2021   a.) MRI brain 01/24/2021: acute ischemia at the base of the LEFT central sulcus   OAB (overactive bladder)    OSA (obstructive sleep apnea)    a.) mild; does not require nocturnal PAP therapy   Osteopenia    Pneumonia    Skin cancer    T2DM (type 2 diabetes mellitus) (HCC)    Tinnitus    Vertebral artery stenosis, bilateral 01/25/2021   a.) CTA neck 01/25/2021: severe LEFT and moderate RIGHT vertebral artery origin stenosis   Vertigo     Review of systems:  Otherwise negative.    Physical Exam  Gen: Alert, oriented. Appears stated age.  HEENT: [place]/AT. PERRLA. Lungs: CTA, no wheezes. CV: RR nl S1, S2. Abd: soft, benign, no masses. BS+ Ext: No edema. Pulses 2+    Planned procedures: Proceed with colonoscopy. The patient understands the nature of the planned procedure, indications, risks, alternatives and potential complications including but not limited to bleeding, infection, perforation, damage to internal organs and possible oversedation/side effects from anesthesia. The patient agrees and gives consent to proceed.  Please refer to procedure notes for findings, recommendations and patient disposition/instructions.     Derreck Wiltsey K. Nicholson, M.D. Gastroenterology 04/03/2024  10:48 AM          [1]  Current Facility-Administered Medications:    0.9 %  sodium chloride  infusion, , Intravenous, Continuous, Seeley Hissong K, MD, Last Rate: 20 mL/hr at 04/03/24 1044, New Bag at  04/03/24 1044 [2] No Known Allergies

## 2024-04-03 NOTE — Transfer of Care (Signed)
 Immediate Anesthesia Transfer of Care Note  Patient: Morgan Nicholson  Procedure(s) Performed: COLONOSCOPY POLYPECTOMY, INTESTINE  Patient Location: PACU  Anesthesia Type:General  Level of Consciousness: awake  Airway & Oxygen Therapy: Patient Spontanous Breathing  Post-op Assessment: Report given to RN  Post vital signs: Reviewed and stable  Last Vitals:  Vitals Value Taken Time  BP 100/53 04/03/24 11:26  Temp 35.6 C 04/03/24 11:26  Pulse 65 04/03/24 11:27  Resp 15 04/03/24 11:27  SpO2 93 % 04/03/24 11:27  Vitals shown include unfiled device data.  Last Pain:  Vitals:   04/03/24 1126  TempSrc: Temporal  PainSc: Asleep         Complications: No notable events documented.

## 2024-04-03 NOTE — Anesthesia Preprocedure Evaluation (Signed)
 "                                  Anesthesia Evaluation  Patient identified by MRN, date of birth, ID band Patient awake    Reviewed: Allergy & Precautions, NPO status , Patient's Chart, lab work & pertinent test results  Airway Mallampati: II  TM Distance: >3 FB Neck ROM: full    Dental  (+) Upper Dentures   Pulmonary neg pulmonary ROS, sleep apnea    Pulmonary exam normal breath sounds clear to auscultation       Cardiovascular Exercise Tolerance: Good hypertension, Pt. on medications negative cardio ROS Normal cardiovascular exam Rhythm:Regular Rate:Normal     Neuro/Psych CVA negative neurological ROS  negative psych ROS   GI/Hepatic negative GI ROS, Neg liver ROS,,,  Endo/Other  negative endocrine ROSdiabetes, Well Controlled, Type 1, Insulin  Dependent    Renal/GU negative Renal ROS  negative genitourinary   Musculoskeletal  (+) Arthritis ,    Abdominal Normal abdominal exam  (+)   Peds negative pediatric ROS (+)  Hematology negative hematology ROS (+)   Anesthesia Other Findings Past Medical History: 08/29/2013: Adenomatous polyp No date: Aortic atherosclerosis No date: Arthritis No date: Arthritis of both hips 01/24/2021: Bilateral carotid artery disease     Comment:  a.) CTA neck 01/24/2021: 55% RICA, 45% LICA No date: Bilateral cataracts No date: Cervical spinal stenosis No date: CKD (chronic kidney disease), stage III (HCC) 01/25/2021: Diastolic dysfunction     Comment:  a.) TTE 01/25/2021: EF 60-65%, mild LVH, mild LA dil,               G1DD No date: High cholesterol No date: Hyperplastic colon polyp No date: Hypertension 03/04/2021: Implantable loop recorder present     Comment:  a.) Linq II device 01/24/2021: Ischemic stroke [facility])     Comment:  a.) MRI brain 01/24/2021: acute ischemia at the base of               the LEFT central sulcus No date: OAB (overactive bladder) No date: OSA (obstructive sleep apnea)     Comment:   a.) mild; does not require nocturnal PAP therapy No date: Osteopenia No date: Pneumonia No date: Skin cancer No date: T2DM (type 2 diabetes mellitus) (HCC) No date: Tinnitus 01/25/2021: Vertebral artery stenosis, bilateral     Comment:  a.) CTA neck 01/25/2021: severe LEFT and moderate RIGHT               vertebral artery origin stenosis No date: Vertigo  Past Surgical History: 05/01/2020: CARPAL TUNNEL RELEASE; Right     Comment:  Procedure: CARPAL TUNNEL RELEASE ENDOSCOPIC;  Surgeon:               Edie Norleen PARAS, MD;  Location: [facility] ORS;  Service:               Orthopedics;  Laterality: Right; 08/04/2023: CATARACT EXTRACTION W/PHACO; Right     Comment:  Procedure: PHACOEMULSIFICATION, CATARACT, WITH IOL               INSERTION 7.27, 00:43.2;  Surgeon: Enola Feliciano Hugger, MD;  Location: [facility];  Service:               Ophthalmology;  Laterality: Right; 08/18/2023: CATARACT EXTRACTION W/PHACO; Left     Comment:  Procedure: PHACOEMULSIFICATION, CATARACT, WITH IOL               INSERTION 5.19 00:41.2;  Surgeon: Enola Feliciano Hugger,              MD;  Location: [facility];  Service:               Ophthalmology;  Laterality: Left; No date: colonic polyps 02/23/2017: COLONOSCOPY WITH PROPOFOL ; N/A     Comment:  Procedure: COLONOSCOPY WITH PROPOFOL ;  Surgeon: Viktoria Lamar DASEN, MD;  Location: [facility] END[facility]COPY;  Service:               Endoscopy;  Laterality: N/A; 03/04/2021: LOOP RECORDER IMPLANT; N/A     Comment:  Procedure: LOOP RECORDER IMPLANT; Location: [facility];               Surgeon: Marsa Nimrod, MD No date: skin cancer removal; Bilateral     Comment:  arms 11/01/2022: TOTAL HIP ARTHROPLASTY; Right     Comment:  Procedure: TOTAL HIP ARTHROPLASTY;  Surgeon: Mardee Lynwood SQUIBB, MD;  Location: [facility] ORS;  Service: Orthopedics;               Laterality: Right; 05/01/2020: TRIGGER FINGER RELEASE; Right     Comment:   Procedure: RELEASE TRIGGER FINGER/A-1 PULLEY;  Surgeon:               Edie Norleen PARAS, MD;  Location: [facility] ORS;  Service:               Orthopedics;  Laterality: Right; No date: TUBAL LIGATION  BMI    Body Mass Index: 27.79 kg/m      Reproductive/Obstetrics negative OB ROS                              Anesthesia Physical Anesthesia Plan  ASA: 3  Anesthesia Plan: General   Post-op Pain Management:    Induction:   PONV Risk Score and Plan: Propofol  infusion and TIVA  Airway Management Planned: Natural Airway and Nasal Cannula  Additional Equipment:   Intra-op Plan:   Post-operative Plan:   Informed Consent: I have reviewed the patients History and Physical, chart, labs and discussed the procedure including the risks, benefits and alternatives for the proposed anesthesia with the patient or authorized representative who has indicated his/her understanding and acceptance.     Dental Advisory Given  Plan Discussed with: CRNA  Anesthesia Plan Comments:         Anesthesia Quick Evaluation  "

## 2024-04-03 NOTE — Anesthesia Postprocedure Evaluation (Signed)
"   Anesthesia Post Note  Patient: Morgan Nicholson  Procedure(s) Performed: COLONOSCOPY POLYPECTOMY, INTESTINE  Patient location during evaluation: PACU Anesthesia Type: General Level of consciousness: awake Pain management: satisfactory to patient Vital Signs Assessment: post-procedure vital signs reviewed and stable Cardiovascular status: stable Anesthetic complications: no   No notable events documented.   Last Vitals:  Vitals:   04/03/24 1031 04/03/24 1126  BP: (!) 136/108 (!) 100/53  Pulse: 67   Resp: 18   Temp: (!) 36 C (!) 35.6 C  SpO2: 97%     Last Pain:  Vitals:   04/03/24 1126  TempSrc: Temporal  PainSc: Asleep                 VAN STAVEREN,Dmitriy Gair      "

## 2024-04-04 LAB — SURGICAL PATHOLOGY
